# Patient Record
Sex: Female | Born: 1945 | Race: Black or African American | Hispanic: No | Smoking: Never smoker
Health system: Southern US, Community
[De-identification: ages and names within clinical notes are randomized; demographics above are authoritative.]

## PROBLEM LIST (undated history)

## (undated) DIAGNOSIS — Z8719 Personal history of other diseases of the digestive system: Secondary | ICD-10-CM

## (undated) DIAGNOSIS — R112 Nausea with vomiting, unspecified: Secondary | ICD-10-CM

## (undated) DIAGNOSIS — T8859XA Other complications of anesthesia, initial encounter: Secondary | ICD-10-CM

## (undated) DIAGNOSIS — M7050 Other bursitis of knee, unspecified knee: Secondary | ICD-10-CM

## (undated) DIAGNOSIS — K5732 Diverticulitis of large intestine without perforation or abscess without bleeding: Secondary | ICD-10-CM

## (undated) DIAGNOSIS — K219 Gastro-esophageal reflux disease without esophagitis: Secondary | ICD-10-CM

## (undated) DIAGNOSIS — Z9889 Other specified postprocedural states: Secondary | ICD-10-CM

## (undated) DIAGNOSIS — M1990 Unspecified osteoarthritis, unspecified site: Secondary | ICD-10-CM

## (undated) DIAGNOSIS — I10 Essential (primary) hypertension: Secondary | ICD-10-CM

## (undated) DIAGNOSIS — G4730 Sleep apnea, unspecified: Secondary | ICD-10-CM

## (undated) DIAGNOSIS — T4145XA Adverse effect of unspecified anesthetic, initial encounter: Secondary | ICD-10-CM

## (undated) DIAGNOSIS — I219 Acute myocardial infarction, unspecified: Secondary | ICD-10-CM

## (undated) DIAGNOSIS — E042 Nontoxic multinodular goiter: Secondary | ICD-10-CM

## (undated) DIAGNOSIS — J189 Pneumonia, unspecified organism: Secondary | ICD-10-CM

## (undated) DIAGNOSIS — C801 Malignant (primary) neoplasm, unspecified: Secondary | ICD-10-CM

## (undated) HISTORY — PX: FOOT SURGERY: SHX648

## (undated) HISTORY — DX: Other bursitis of knee, unspecified knee: M70.50

## (undated) HISTORY — PX: BREAST BIOPSY: SHX20

## (undated) HISTORY — PX: ABDOMINAL HYSTERECTOMY: SHX81

## (undated) HISTORY — DX: Unspecified osteoarthritis, unspecified site: M19.90

## (undated) HISTORY — DX: Sleep apnea, unspecified: G47.30

## (undated) HISTORY — PX: CERVICAL DISC SURGERY: SHX588

## (undated) HISTORY — PX: CARDIAC CATHETERIZATION: SHX172

## (undated) HISTORY — DX: Diverticulitis of large intestine without perforation or abscess without bleeding: K57.32

## (undated) HISTORY — PX: KNEE ARTHROSCOPY: SUR90

---

## 1998-01-31 ENCOUNTER — Encounter: Admission: RE | Admit: 1998-01-31 | Discharge: 1998-05-01 | Payer: Self-pay | Admitting: Orthopedic Surgery

## 2000-03-11 ENCOUNTER — Encounter: Admission: RE | Admit: 2000-03-11 | Discharge: 2000-04-23 | Payer: Self-pay | Admitting: Orthopedic Surgery

## 2003-08-31 ENCOUNTER — Encounter: Admission: RE | Admit: 2003-08-31 | Discharge: 2003-11-11 | Payer: Self-pay | Admitting: Orthopedic Surgery

## 2004-03-13 ENCOUNTER — Encounter: Admission: RE | Admit: 2004-03-13 | Discharge: 2004-05-24 | Payer: Self-pay | Admitting: Orthopedic Surgery

## 2007-10-13 ENCOUNTER — Encounter: Admission: RE | Admit: 2007-10-13 | Discharge: 2007-12-05 | Payer: Self-pay | Admitting: Orthopedic Surgery

## 2009-11-12 ENCOUNTER — Emergency Department (HOSPITAL_BASED_OUTPATIENT_CLINIC_OR_DEPARTMENT_OTHER)
Admission: EM | Admit: 2009-11-12 | Discharge: 2009-11-12 | Payer: Self-pay | Source: Home / Self Care | Admitting: Emergency Medicine

## 2009-11-12 ENCOUNTER — Ambulatory Visit: Payer: Self-pay | Admitting: Diagnostic Radiology

## 2011-05-27 ENCOUNTER — Emergency Department (INDEPENDENT_AMBULATORY_CARE_PROVIDER_SITE_OTHER): Payer: Medicare Other

## 2011-05-27 ENCOUNTER — Emergency Department (HOSPITAL_BASED_OUTPATIENT_CLINIC_OR_DEPARTMENT_OTHER)
Admission: EM | Admit: 2011-05-27 | Discharge: 2011-05-27 | Disposition: A | Discharge status: Home / Self Care | Payer: Medicare Other | Attending: Emergency Medicine | Admitting: Emergency Medicine

## 2011-05-27 ENCOUNTER — Encounter: Payer: Self-pay | Admitting: Emergency Medicine

## 2011-05-27 DIAGNOSIS — I10 Essential (primary) hypertension: Secondary | ICD-10-CM | POA: Insufficient documentation

## 2011-05-27 DIAGNOSIS — J45909 Unspecified asthma, uncomplicated: Secondary | ICD-10-CM | POA: Insufficient documentation

## 2011-05-27 DIAGNOSIS — K219 Gastro-esophageal reflux disease without esophagitis: Secondary | ICD-10-CM | POA: Insufficient documentation

## 2011-05-27 DIAGNOSIS — R079 Chest pain, unspecified: Secondary | ICD-10-CM

## 2011-05-27 DIAGNOSIS — Z79899 Other long term (current) drug therapy: Secondary | ICD-10-CM | POA: Insufficient documentation

## 2011-05-27 HISTORY — DX: Gastro-esophageal reflux disease without esophagitis: K21.9

## 2011-05-27 HISTORY — DX: Essential (primary) hypertension: I10

## 2011-05-27 LAB — BASIC METABOLIC PANEL
Calcium: 9.8 mg/dL (ref 8.4–10.5)
Creatinine, Ser: 0.6 mg/dL (ref 0.50–1.10)
Sodium: 137 mEq/L (ref 135–145)

## 2011-05-27 LAB — CARDIAC PANEL(CRET KIN+CKTOT+MB+TROPI)
CK, MB: 2.5 ng/mL (ref 0.3–4.0)
Relative Index: 2 (ref 0.0–2.5)
Troponin I: <0.3 ng/mL (ref <0.30)

## 2011-05-27 LAB — DIFFERENTIAL
Basophils Absolute: 0 10*3/uL (ref 0.0–0.1)
Basophils Relative: 0 % (ref 0–1)
Eosinophils Relative: 1 % (ref 0–5)
Lymphocytes Relative: 23 % (ref 12–46)
Lymphs Abs: 1.7 10*3/uL (ref 0.7–4.0)
Monocytes Absolute: 0.5 10*3/uL (ref 0.1–1.0)
Monocytes Relative: 7 % (ref 3–12)
Neutrophils Relative %: 69 % (ref 43–77)

## 2011-05-27 LAB — CBC
Hemoglobin: 13.4 g/dL (ref 12.0–15.0)
MCV: 86.6 fL (ref 78.0–100.0)
Platelets: 255 10*3/uL (ref 150–400)
WBC: 7.7 10*3/uL (ref 4.0–10.5)

## 2011-05-27 NOTE — ED Provider Notes (Signed)
History     CSN: [id number] Arrival date & time: 05/27/2011 10:36 AM   None     Chief Complaint  Patient presents with  . Chest Pain    (Consider location/radiation/quality/duration/timing/severity/associated sxs/prior treatment) HPI Comments: Patient is a 65 year old woman who woke up around 4 in the morning with her heart beating funny. She says this has happened several times when she wakes up in the morning. She doesn't feel any chest pain or palpitations now. When this occurred, she felt a pain in her shoulders, and had some belching. She has had no prior evaluation for this particular symptom. However, she's had stress test in the past, and had a cardiac catheterization in 2011 which was negative. She has a history of acid reflux, for which she takes Protonix.  Patient is a 65 y.o. female presenting with chest pain.  Chest Pain The chest pain began 6 - 12 hours ago. Chest pain occurs intermittently. The chest pain is resolved. Associated with: She noted pain in the shoulders and excessive belching. Pain scale at highest: She describes the feeling as feeling her heart beating, not so much a pain. Exacerbated by: Nothing seems to make this sensation better or worse. Pertinent negatives for primary symptoms include no fever. Associated symptoms comments: She notes pain in her shoulders, and excessive burping.. She tried nothing for the symptoms. Risk factors include being elderly and post-menopausal.     Past Medical History  Diagnosis Date  . Hypertension   . Asthma   . GERD (gastroesophageal reflux disease)     Past Surgical History  Procedure Date  . Abdominal hysterectomy   . Cervical disc surgery   . Breast biopsy     several- all benign  . Cardiac catheterization     No family history on file.  History  Substance Use Topics  . Smoking status: Never Smoker   . Smokeless tobacco: Not on file  . Alcohol Use: No    OB History    Grav Para Term Preterm Abortions  TAB SAB Ect Mult Living                  Review of Systems  Constitutional: Negative.  Negative for fever.  HENT: Negative.   Eyes: Negative.   Respiratory: Negative.   Cardiovascular: Positive for chest pain.  Gastrointestinal:       Excessive burping.  Genitourinary: Negative.   Musculoskeletal:       Shoulder pain.  Skin: Negative.   Neurological: Negative.   Psychiatric/Behavioral: Negative.     Allergies  Morphine and related; Avelox; Biaxin; Celebrex; Codeine; Food; Hycodan; Pentazocine; Septra; Stadol; Zoloft; Duraphen; Floxin; and Penicillins  Home Medications   Current Outpatient Rx  Name Route Sig Dispense Refill  . ACETAMINOPHEN 500 MG PO TABS Oral Take 1,000 mg by mouth every 6 (six) hours as needed. For arthritis     . ALBUTEROL SULFATE HFA 108 (90 BASE) MCG/ACT IN AERS Inhalation Inhale 2 puffs into the lungs every 4 (four) hours as needed. Wheezing and shortness of breath     . AMILORIDE-HYDROCHLOROTHIAZIDE 5-50 MG PO TABS Oral Take 1 tablet by mouth daily.      . ASPIRIN 81 MG PO TABS Oral Take 81 mg by mouth daily.      Marland Kitchen DILTIAZEM HCL COATED BEADS 300 MG PO CP24 Oral Take 300 mg by mouth daily.      Marland Kitchen ESTRADIOL 2 MG PO TABS Oral Take 2 mg by mouth daily.      Marland Kitchen  MELOXICAM 15 MG PO TABS Oral Take 15 mg by mouth daily as needed. For arthritis     . MONTELUKAST SODIUM 10 MG PO TABS Oral Take 10 mg by mouth at bedtime.      Marland Kitchen PANTOPRAZOLE SODIUM 40 MG PO TBEC Oral Take 40 mg by mouth daily.        BP 131/77  Pulse 70  Temp(Src) 97.8 F (36.6 C) (Oral)  Resp 16  SpO2 99%  Physical Exam  Nursing note and vitals reviewed. Constitutional: She is oriented to person, place, and time. She appears well-developed and well-nourished. No distress.  HENT:  Head: Normocephalic and atraumatic.  Right Ear: External ear normal.  Left Ear: External ear normal.  Mouth/Throat: Oropharynx is clear and moist.  Eyes: Conjunctivae and EOM are normal. Pupils are equal,  round, and reactive to light.  Neck: Normal range of motion. Neck supple. No tracheal deviation present. Thyromegaly present.  Cardiovascular: Normal rate, regular rhythm and normal heart sounds.   Pulmonary/Chest: Effort normal and breath sounds normal.  Abdominal: Soft. Bowel sounds are normal.  Musculoskeletal: Normal range of motion.       No deformity of her shoulders. No calf tenderness, no Homans sign.  Lymphadenopathy:    She has no cervical adenopathy.  Neurological: She is alert and oriented to person, place, and time. She has normal reflexes.       No sensory or motor deficits.  Skin: Skin is warm and dry.  Psychiatric: She has a normal mood and affect. Her behavior is normal.    ED Course  Procedures (including critical care time)   Labs Reviewed  CARDIAC PANEL(CRET KIN+CKTOT+MB+TROPI)  CBC  DIFFERENTIAL  BASIC METABOLIC PANEL   Dg Chest 2 View  05/27/2011  *RADIOLOGY REPORT*  Clinical Data: Chest pain.  CHEST - 2 VIEW  Comparison: 11/12/2009  Findings: Heart and mediastinal contours are within normal limits. No focal opacities or effusions.  No acute bony abnormality. Degenerative changes in the thoracic spine.  IMPRESSION: No active cardiopulmonary disease.  Original Report Authenticated By: Cyndie Chime, M.D.    Date: 05/27/2011  Rate: 77 Rhythm: normal sinus rhythm  QRS Axis: left  Intervals: normal  ST/T Wave abnormalities: normal  Conduction Disutrbances: Incomplete RBBB  Narrative Interpretation: Incomplete right bundle branch block.  Old EKG Reviewed: none available  1:00 PM Patient was seen and had physical examination. EKG was benign. Laboratory tests and chest x-ray were ordered.  1:00 PM Chest x-ray and cardiac markers were negative. I discussed these results with the patient. Her her test results are negative it is quite safe to go home. She has an appointment tomorrow for preop testing for a total knee operation. I advised her to take her lab  tests with her and she may be able to avoid having some of the tests.   Results for orders placed during the hospital encounter of 05/27/11  CARDIAC PANEL(CRET KIN+CKTOT+MB+TROPI)      Component Value Range   Total CK 122  7 - 177 (U/L)   CK, MB 2.5  0.3 - 4.0 (ng/mL)   Troponin I <0.30  <0.30 (ng/mL)   Relative Index 2.0  0.0 - 2.5   CBC      Component Value Range   WBC 7.7  4.0 - 10.5 (K/uL)   RBC 4.48  3.87 - 5.11 (MIL/uL)   Hemoglobin 13.4  12.0 - 15.0 (g/dL)   HCT 24.4  01.0 - 27.2 (%)   MCV 86.6  78.0 - 100.0 (fL)   MCH 29.9  26.0 - 34.0 (pg)   MCHC 34.5  30.0 - 36.0 (g/dL)   RDW 16.1  09.6 - 04.5 (%)   Platelets 255  150 - 400 (K/uL)  DIFFERENTIAL      Component Value Range   Neutrophils Relative 69  43 - 77 (%)   Neutro Abs 5.4  1.7 - 7.7 (K/uL)   Lymphocytes Relative 23  12 - 46 (%)   Lymphs Abs 1.7  0.7 - 4.0 (K/uL)   Monocytes Relative 7  3 - 12 (%)   Monocytes Absolute 0.5  0.1 - 1.0 (K/uL)   Eosinophils Relative 1  0 - 5 (%)   Eosinophils Absolute 0.1  0.0 - 0.7 (K/uL)   Basophils Relative 0  0 - 1 (%)   Basophils Absolute 0.0  0.0 - 0.1 (K/uL)  BASIC METABOLIC PANEL      Component Value Range   Sodium 137  135 - 145 (mEq/L)   Potassium 3.5  3.5 - 5.1 (mEq/L)   Chloride 100  96 - 112 (mEq/L)   CO2 27  19 - 32 (mEq/L)   Glucose, Bld 92  70 - 99 (mg/dL)   BUN 12  6 - 23 (mg/dL)   Creatinine, Ser 4.09  0.50 - 1.10 (mg/dL)   Calcium 9.8  8.4 - 81.1 (mg/dL)   GFR calc non Af Amer >90  >90 (mL/min)   GFR calc Af Amer >90  >90 (mL/min)   Dg Chest 2 View  05/27/2011  *RADIOLOGY REPORT*  Clinical Data: Chest pain.  CHEST - 2 VIEW  Comparison: 11/12/2009  Findings: Heart and mediastinal contours are within normal limits. No focal opacities or effusions.  No acute bony abnormality. Degenerative changes in the thoracic spine.  IMPRESSION: No active cardiopulmonary disease.  Original Report Authenticated By: Cyndie Chime, M.D.     1. Chest pain, unspecified             Carleene Cooper III, MD 05/27/11 1300

## 2011-05-27 NOTE — ED Notes (Signed)
Pt reports "felt heart beating" more than usual at 4 am when she got up to use bathroom- does not describe feeling as pain, but sts she felt "weird"- had some belching as well

## 2011-05-28 ENCOUNTER — Encounter (HOSPITAL_COMMUNITY): Payer: Medicare Other

## 2011-05-28 ENCOUNTER — Encounter (HOSPITAL_COMMUNITY): Payer: Self-pay

## 2011-05-28 DIAGNOSIS — K5732 Diverticulitis of large intestine without perforation or abscess without bleeding: Secondary | ICD-10-CM

## 2011-05-28 HISTORY — DX: Diverticulitis of large intestine without perforation or abscess without bleeding: K57.32

## 2011-05-28 LAB — URINALYSIS, ROUTINE W REFLEX MICROSCOPIC
Bilirubin Urine: NEGATIVE
Hgb urine dipstick: NEGATIVE
Protein, ur: NEGATIVE mg/dL
Urobilinogen, UA: 0.2 mg/dL (ref 0.0–1.0)

## 2011-05-28 LAB — COMPREHENSIVE METABOLIC PANEL
AST: 19 U/L (ref 0–37)
Albumin: 4.2 g/dL (ref 3.5–5.2)
Alkaline Phosphatase: 66 U/L (ref 39–117)
Chloride: 97 mEq/L (ref 96–112)
GFR calc Af Amer: 73 mL/min — ABNORMAL LOW (ref >90)
GFR calc non Af Amer: 63 mL/min — ABNORMAL LOW (ref >90)
Glucose, Bld: 87 mg/dL (ref 70–99)
Total Protein: 7.8 g/dL (ref 6.0–8.3)

## 2011-05-28 LAB — PROTIME-INR: Prothrombin Time: 13.8 seconds (ref 11.6–15.2)

## 2011-05-28 LAB — SURGICAL PCR SCREEN: MRSA, PCR: NEGATIVE

## 2011-05-28 MED ORDER — CHLORHEXIDINE GLUCONATE 4 % EX LIQD: 60.0000 mL | Freq: Once | CUTANEOUS | Status: DC

## 2011-05-28 NOTE — Pre-Procedure Instructions (Signed)
PT. Has allergy to Floxin and Penicillin(rash to both)-fax to Dr. Lequita Halt to advise and order Preop antibiotic.

## 2011-05-28 NOTE — Pre-Procedure Instructions (Signed)
05-28-11 PCR screen done. Ekg 05-07-11 with chart. CBC, BMP , CXR results with chart done 05-27-11.

## 2011-05-28 NOTE — Pre-Procedure Instructions (Addendum)
Pt. Has allergy to Floxin and Penicillin allergy-fax to Dr. Lequita Halt office to advise of preop antibiotic. 05-30-11 faxed received to give Ancef per protocol per Dr. Lequita Halt.

## 2011-05-28 NOTE — Patient Instructions (Signed)
20 Legna Mausolf  05/28/2011   Your procedure is scheduled on: 06-04-11 Report to [facility] at 0515 AM.  Call this number if you have problems the morning of surgery: [phone]   Remember:   Do not eat food:After Midnight.  Do not drink clear liquids: After Midnight.  Take these medicines the morning of surgery with A SIP OF WATER: Cartia, Pantoprazole, ProAir Inhaler-use and bring   Do not wear jewelry, make-up or nail polish.  Do not wear lotions, powders, or perfumes. You may wear deodorant.  Do not shave 48 hours prior to surgery.  Do not bring valuables to the hospital.  Contacts, dentures or bridgework may not be worn into surgery.  Leave suitcase in the car. After surgery it may be brought to your room.  For patients admitted to the hospital, checkout time is 11:00 AM the day of discharge.   Patients discharged the day of surgery will not be allowed to drive home.  Name and phone number of your driver: spouse  Special Instructions: Incentive Spirometry - Practice and bring it with you on the day of surgery. and CHG Shower Use Special Wash: 1/2 bottle night before surgery and 1/2 bottle morning of surgery.   Please read over the following fact sheets that you were given: Blood Transfusion Information and MRSA Information

## 2011-05-30 ENCOUNTER — Other Ambulatory Visit: Payer: Self-pay | Admitting: Orthopedic Surgery

## 2011-05-30 NOTE — H&P (Signed)
  Dawn Collins DOB: 10/19/1945  Date of Admission:  06/04/2011  Chief Complaint:  Right Knee Pain  History of Present Illness: The patient is a 65 year old female who comes in today for a preoperative History and Physical. The patient is scheduled for a right total knee arthroplasty to be performed by Dr. Gus Rankin. Aluisio, MD at [facility] on 06/04/2011.  Allergies: Aspirin - GI upset Avelox - Rash Biaxin - GI upset Celebrex - Chest Pain [place] Derivatives - Sweating, vomiting, shortness of breath Duraphen - Palpitations Floxin - Rash Hycodan Syrup - Dyspnea Penicillins - Rash Pentazocine-Naloxone HCL - Chest pain, fever, jaw pain Septra - Vomiting Stadol - Nausea, vomiting Talwin NX - Numbness Zoloft - Tinnitus  Medications: Amiloride-HCTZ 5-50 Aspirin 81mg   Cartia XT 300 mg Doxycycline Hyclate 100 mg Estradiol 2 mg Meloxicam 15 mg Metronidazole 0.75% gel Pantoprazole Sodium 40 mg ProAir HFA Singulair 10 mg  Problem List/Past Medical History: Anxiety Disorder Hypertension: Asthma Asthmatic Bronchitis Fibrocystic Breast Disease Carpal Tunnel Syndrome, Bilateral Cervical Spine Stenosis Diverticulitis Of Colon Diverticulosis Gastroesophageal Reflux Disease Fatty Liver Headaches [place]ia Irritable bowel syndrome Lumbar Radiculopathy Nontoxic Multinodular Goiter Sleep Apnea. Mild Obstructive Spinal Stenosis History of Bursitis History of Sinusitis Urinary Incontinence. Stress  Family History: Father. Deceased, Emphysema, Prostate Cancer. Age 31 Mother. Deceased, Breast Cancer, Brain Cancer, Kidney disease. Age 39  Social History: Tobacco use. Never smoker. Alcohol use. Never consumed alcohol. Marital status. Married. Children. 1 son Living situation. Lives with spouse. 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 Plan is for Home  Past Surgical History: Tubal Ligation Hysterectomy  (not due to cancer) - Partial Breast Surgery, Bilateral Breast Surgery Nipple Exploartion, Bilateral Foot Surgery, Bilateral Arthroscopic Knee Surgery - Right Neck Disc Surgery. Date: 41. Cervical Lam Cardiac Cath. Date: 09/2010. Dr. Lamar Sprinkles  ROS: General: No chills, fevers, night sweats, fatigue. Neuro:  No seizures, syncope, paralysis, visual problems. Respiratory:  Positive for shortness of breath at rest; no productive cough, hemoptysis. Cardiovascular: No chest pain, angina, palpitations, orthopnea. GI: No nausea, vomiting, diarrhea, constipation. Positive for heartburn. GU:  No dysuria, hematuria, discharge. Musculoskeletal:  Joint pain, back pain, and morning stiffness.  Vitals: Weight: 182 lb Height: 65.75 in Weight was reported by patient. Height was reported by patient. Body Surface Area: 1.96 m Body Mass Index: 29.6 kg/m Pulse: 76 (Regular) Resp.: 12 (Unlabored) BP: 142/78 (Sitting, Left Arm, Standard)  Physical Exam: GENERAL: Patient is a 65 y.o. female, well-nourished, well-developed, no acute distress. Alert, oriented, cooperative. Good historian. HENT:  Normocephalic, atraumatic. Pupils round and reactive. EOMs intact. NECK:  Supple, no bruits. CHEST:  Clear to anterior and posterior chest walls. No rhonchi, rales, wheezes. HEART:  Regular, rate and rhythm.  No murmurs.  S1 and S2 noted. ABDOMEN:  Soft, nontender, bowel sounds present, round abdomen. RECTAL/BREAST/GENITALIA:  Not done, not pertinent to present illness. EXTREMITIES:  Valgus deformity, motor intact, 0-120 degrees, marked crepitus.   Assessment & Plan: Osteoarthrosis NOS, lower leg (715.96) Impression: Right Knee  Patient will be admitted for a right total knee replacement. Risks and benefits of the surgery have been discussed with the patient and they elect to proceed with surgery.  There are on active contraindications to upcoming procedure such as ongoing infection or  progressive neurological disease.  Plan is to go home with her husband and sister.  Avel Peace, PA-C

## 2011-06-03 ENCOUNTER — Encounter (HOSPITAL_COMMUNITY): Payer: Self-pay | Admitting: Orthopedic Surgery

## 2011-06-03 MED ORDER — BUPIVACAINE 0.25 % ON-Q PUMP SINGLE CATH 300ML
300.0000 mL | INJECTION | Status: DC
  Administered 2011-06-04: 300 mL
  Filled 2011-06-03: qty 300

## 2011-06-04 ENCOUNTER — Encounter (HOSPITAL_COMMUNITY): Payer: Self-pay | Admitting: Anesthesiology

## 2011-06-04 ENCOUNTER — Inpatient Hospital Stay (HOSPITAL_COMMUNITY): Payer: Medicare Other | Admitting: Anesthesiology

## 2011-06-04 ENCOUNTER — Inpatient Hospital Stay (HOSPITAL_COMMUNITY)
Admission: RE | Admit: 2011-06-04 | Discharge: 2011-06-07 | DRG: 470 | Disposition: A | Discharge status: Home Health | Payer: Medicare Other | Source: Ambulatory Visit | Attending: Orthopedic Surgery | Admitting: Orthopedic Surgery

## 2011-06-04 ENCOUNTER — Encounter (HOSPITAL_COMMUNITY): Payer: Self-pay | Admitting: *Deleted

## 2011-06-04 ENCOUNTER — Encounter (HOSPITAL_COMMUNITY)
Admission: RE | Disposition: A | Discharge status: Home Health | Payer: Self-pay | Source: Ambulatory Visit | Attending: Orthopedic Surgery

## 2011-06-04 DIAGNOSIS — Z888 Allergy status to other drugs, medicaments and biological substances status: Secondary | ICD-10-CM

## 2011-06-04 DIAGNOSIS — Z79899 Other long term (current) drug therapy: Secondary | ICD-10-CM

## 2011-06-04 DIAGNOSIS — G4733 Obstructive sleep apnea (adult) (pediatric): Secondary | ICD-10-CM | POA: Diagnosis present

## 2011-06-04 DIAGNOSIS — K219 Gastro-esophageal reflux disease without esophagitis: Secondary | ICD-10-CM | POA: Diagnosis present

## 2011-06-04 DIAGNOSIS — E871 Hypo-osmolality and hyponatremia: Secondary | ICD-10-CM | POA: Diagnosis not present

## 2011-06-04 DIAGNOSIS — N393 Stress incontinence (female) (male): Secondary | ICD-10-CM | POA: Diagnosis present

## 2011-06-04 DIAGNOSIS — M4800 Spinal stenosis, site unspecified: Secondary | ICD-10-CM | POA: Diagnosis present

## 2011-06-04 DIAGNOSIS — M1710 Unilateral primary osteoarthritis, unspecified knee: Principal | ICD-10-CM | POA: Diagnosis present

## 2011-06-04 DIAGNOSIS — K589 Irritable bowel syndrome without diarrhea: Secondary | ICD-10-CM | POA: Diagnosis present

## 2011-06-04 DIAGNOSIS — M76899 Other specified enthesopathies of unspecified lower limb, excluding foot: Secondary | ICD-10-CM | POA: Diagnosis present

## 2011-06-04 DIAGNOSIS — M1711 Unilateral primary osteoarthritis, right knee: Secondary | ICD-10-CM

## 2011-06-04 DIAGNOSIS — Z01812 Encounter for preprocedural laboratory examination: Secondary | ICD-10-CM

## 2011-06-04 DIAGNOSIS — M21069 Valgus deformity, not elsewhere classified, unspecified knee: Secondary | ICD-10-CM | POA: Diagnosis present

## 2011-06-04 DIAGNOSIS — E876 Hypokalemia: Secondary | ICD-10-CM | POA: Diagnosis not present

## 2011-06-04 DIAGNOSIS — I10 Essential (primary) hypertension: Secondary | ICD-10-CM | POA: Diagnosis present

## 2011-06-04 DIAGNOSIS — IMO0002 Reserved for concepts with insufficient information to code with codable children: Principal | ICD-10-CM | POA: Diagnosis present

## 2011-06-04 DIAGNOSIS — K5730 Diverticulosis of large intestine without perforation or abscess without bleeding: Secondary | ICD-10-CM | POA: Diagnosis present

## 2011-06-04 DIAGNOSIS — Z880 Allergy status to penicillin: Secondary | ICD-10-CM

## 2011-06-04 DIAGNOSIS — F411 Generalized anxiety disorder: Secondary | ICD-10-CM | POA: Diagnosis present

## 2011-06-04 DIAGNOSIS — R110 Nausea: Secondary | ICD-10-CM | POA: Diagnosis not present

## 2011-06-04 DIAGNOSIS — J45909 Unspecified asthma, uncomplicated: Secondary | ICD-10-CM | POA: Diagnosis present

## 2011-06-04 DIAGNOSIS — G4700 Insomnia, unspecified: Secondary | ICD-10-CM | POA: Diagnosis present

## 2011-06-04 DIAGNOSIS — Z7982 Long term (current) use of aspirin: Secondary | ICD-10-CM

## 2011-06-04 HISTORY — PX: TOTAL KNEE ARTHROPLASTY: SHX125

## 2011-06-04 LAB — TYPE AND SCREEN: ABO/RH(D): AB POS

## 2011-06-04 LAB — ABO/RH: ABO/RH(D): AB POS

## 2011-06-04 SURGERY — ARTHROPLASTY, KNEE, TOTAL
Anesthesia: Choice | Site: Knee | Laterality: Right | Wound class: Clean

## 2011-06-04 MED ORDER — ONDANSETRON HCL 4 MG/2ML IJ SOLN
4.0000 mg | Freq: Four times a day (QID) | INTRAMUSCULAR | Status: DC | PRN
  Administered 2011-06-06: 4 mg via INTRAVENOUS
  Filled 2011-06-04 (×3): qty 2

## 2011-06-04 MED ORDER — ACETAMINOPHEN 325 MG PO TABS
650.0000 mg | ORAL_TABLET | Freq: Four times a day (QID) | ORAL | Status: DC | PRN
  Administered 2011-06-07 (×2): 650 mg via ORAL
  Filled 2011-06-04 (×2): qty 2

## 2011-06-04 MED ORDER — ACETAMINOPHEN 10 MG/ML IV SOLN
1000.0000 mg | Freq: Four times a day (QID) | INTRAVENOUS | Status: AC
  Administered 2011-06-04 – 2011-06-05 (×4): 1000 mg via INTRAVENOUS
  Filled 2011-06-04 (×3): qty 100

## 2011-06-04 MED ORDER — FENTANYL CITRATE 0.05 MG/ML IJ SOLN
INTRAMUSCULAR | Status: DC | PRN
  Administered 2011-06-04: 100 ug via INTRAVENOUS

## 2011-06-04 MED ORDER — LACTATED RINGERS IV SOLN
INTRAVENOUS | Status: DC | PRN
  Administered 2011-06-04 (×2): via INTRAVENOUS

## 2011-06-04 MED ORDER — MENTHOL 3 MG MT LOZG
1.0000 | LOZENGE | OROMUCOSAL | Status: DC | PRN
  Filled 2011-06-04: qty 9

## 2011-06-04 MED ORDER — TRIAMTERENE-HCTZ 75-50 MG PO TABS
1.0000 | ORAL_TABLET | Freq: Every day | ORAL | Status: DC
  Administered 2011-06-04 – 2011-06-07 (×4): 1 via ORAL
  Filled 2011-06-04 (×4): qty 1

## 2011-06-04 MED ORDER — ONDANSETRON HCL 4 MG PO TABS: 4.0000 mg | ORAL_TABLET | Freq: Four times a day (QID) | ORAL | Status: DC | PRN

## 2011-06-04 MED ORDER — SODIUM CHLORIDE 0.9 % IR SOLN
Status: DC | PRN
  Administered 2011-06-04: 1000 mL

## 2011-06-04 MED ORDER — ONDANSETRON HCL 4 MG/2ML IJ SOLN
4.0000 mg | Freq: Four times a day (QID) | INTRAMUSCULAR | Status: DC | PRN
  Administered 2011-06-04 (×2): 4 mg via INTRAVENOUS

## 2011-06-04 MED ORDER — DEXAMETHASONE SODIUM PHOSPHATE 10 MG/ML IJ SOLN
INTRAMUSCULAR | Status: DC | PRN
  Administered 2011-06-04: 10 mg via INTRAVENOUS

## 2011-06-04 MED ORDER — DOCUSATE SODIUM 100 MG PO CAPS
100.0000 mg | ORAL_CAPSULE | Freq: Two times a day (BID) | ORAL | Status: DC
  Administered 2011-06-05 – 2011-06-06 (×4): 100 mg via ORAL
  Filled 2011-06-04 (×7): qty 1

## 2011-06-04 MED ORDER — ALBUTEROL SULFATE HFA 108 (90 BASE) MCG/ACT IN AERS
2.0000 | INHALATION_SPRAY | RESPIRATORY_TRACT | Status: DC | PRN
  Filled 2011-06-04: qty 6.7

## 2011-06-04 MED ORDER — BUPIVACAINE HCL 0.75 % IJ SOLN
INTRAMUSCULAR | Status: DC | PRN
  Administered 2011-06-04: 14 mg

## 2011-06-04 MED ORDER — CEFAZOLIN SODIUM 1-5 GM-% IV SOLN
1.0000 g | Freq: Four times a day (QID) | INTRAVENOUS | Status: AC
  Administered 2011-06-04 – 2011-06-05 (×3): 1 g via INTRAVENOUS
  Filled 2011-06-04 (×4): qty 50

## 2011-06-04 MED ORDER — BISACODYL 10 MG RE SUPP
10.0000 mg | Freq: Every day | RECTAL | Status: DC | PRN
  Administered 2011-06-06: 10 mg via RECTAL
  Filled 2011-06-04 (×2): qty 1

## 2011-06-04 MED ORDER — POLYETHYLENE GLYCOL 3350 17 G PO PACK
17.0000 g | PACK | Freq: Every day | ORAL | Status: DC | PRN
  Filled 2011-06-04: qty 1

## 2011-06-04 MED ORDER — DIPHENHYDRAMINE HCL 50 MG/ML IJ SOLN: 12.5000 mg | Freq: Four times a day (QID) | INTRAMUSCULAR | Status: DC | PRN

## 2011-06-04 MED ORDER — DILTIAZEM HCL ER COATED BEADS 300 MG PO CP24
300.0000 mg | ORAL_CAPSULE | Freq: Every day | ORAL | Status: DC
  Administered 2011-06-05 – 2011-06-07 (×3): 300 mg via ORAL
  Filled 2011-06-04 (×4): qty 1

## 2011-06-04 MED ORDER — DIPHENHYDRAMINE HCL 12.5 MG/5ML PO ELIX: 12.5000 mg | ORAL_SOLUTION | ORAL | Status: DC | PRN

## 2011-06-04 MED ORDER — ACETAMINOPHEN 650 MG RE SUPP: 650.0000 mg | Freq: Four times a day (QID) | RECTAL | Status: DC | PRN

## 2011-06-04 MED ORDER — FLEET ENEMA 7-19 GM/118ML RE ENEM: 1.0000 | ENEMA | Freq: Every day | RECTAL | Status: DC | PRN

## 2011-06-04 MED ORDER — HYDROMORPHONE 0.3 MG/ML IV SOLN
INTRAVENOUS | Status: DC
  Administered 2011-06-04: 0.999 mg via INTRAVENOUS
  Administered 2011-06-04: 0.599 mg via INTRAVENOUS
  Administered 2011-06-04 – 2011-06-05 (×4): 0.2 mg via INTRAVENOUS
  Filled 2011-06-04: qty 25

## 2011-06-04 MED ORDER — NALOXONE HCL 0.4 MG/ML IJ SOLN: 0.4000 mg | INTRAMUSCULAR | Status: DC | PRN

## 2011-06-04 MED ORDER — MAGNESIUM HYDROXIDE 400 MG/5ML PO SUSP: 30.0000 mL | Freq: Two times a day (BID) | ORAL | Status: DC | PRN

## 2011-06-04 MED ORDER — METOCLOPRAMIDE HCL 10 MG PO TABS: 5.0000 mg | ORAL_TABLET | Freq: Three times a day (TID) | ORAL | Status: DC | PRN

## 2011-06-04 MED ORDER — SODIUM CHLORIDE 0.9 % IJ SOLN: 9.0000 mL | INTRAMUSCULAR | Status: DC | PRN

## 2011-06-04 MED ORDER — PROPOFOL 10 MG/ML IV EMUL
INTRAVENOUS | Status: DC | PRN
  Administered 2011-06-04: 75 ug/kg/min via INTRAVENOUS

## 2011-06-04 MED ORDER — TEMAZEPAM 15 MG PO CAPS: 15.0000 mg | ORAL_CAPSULE | Freq: Every evening | ORAL | Status: DC | PRN

## 2011-06-04 MED ORDER — BISACODYL 5 MG PO TBEC: 10.0000 mg | DELAYED_RELEASE_TABLET | Freq: Every day | ORAL | Status: DC | PRN

## 2011-06-04 MED ORDER — KCL IN DEXTROSE-NACL 20-5-0.9 MEQ/L-%-% IV SOLN
INTRAVENOUS | Status: DC
  Administered 2011-06-04: 14:00:00 via INTRAVENOUS
  Filled 2011-06-04 (×3): qty 1000

## 2011-06-04 MED ORDER — METHOCARBAMOL 100 MG/ML IJ SOLN
500.0000 mg | Freq: Four times a day (QID) | INTRAVENOUS | Status: DC | PRN
  Filled 2011-06-04: qty 5

## 2011-06-04 MED ORDER — KETAMINE HCL 10 MG/ML IJ SOLN
INTRAMUSCULAR | Status: DC | PRN
  Administered 2011-06-04: 20 mg via INTRAVENOUS

## 2011-06-04 MED ORDER — ZOLPIDEM TARTRATE 5 MG PO TABS: 5.0000 mg | ORAL_TABLET | Freq: Every evening | ORAL | Status: DC | PRN

## 2011-06-04 MED ORDER — CEFAZOLIN SODIUM-DEXTROSE 2-3 GM-% IV SOLR
2.0000 g | Freq: Once | INTRAVENOUS | Status: AC
  Administered 2011-06-04: 2 g via INTRAVENOUS
  Filled 2011-06-04: qty 50

## 2011-06-04 MED ORDER — TRAMADOL HCL 50 MG PO TABS: 50.0000 mg | ORAL_TABLET | Freq: Four times a day (QID) | ORAL | Status: DC | PRN

## 2011-06-04 MED ORDER — METOCLOPRAMIDE HCL 5 MG/ML IJ SOLN: 5.0000 mg | Freq: Three times a day (TID) | INTRAMUSCULAR | Status: DC | PRN

## 2011-06-04 MED ORDER — MONTELUKAST SODIUM 10 MG PO TABS
10.0000 mg | ORAL_TABLET | Freq: Every day | ORAL | Status: DC
  Administered 2011-06-05 – 2011-06-06 (×2): 10 mg via ORAL
  Filled 2011-06-04 (×4): qty 1

## 2011-06-04 MED ORDER — ACETAMINOPHEN 10 MG/ML IV SOLN
1000.0000 mg | Freq: Four times a day (QID) | INTRAVENOUS | Status: DC
  Filled 2011-06-04: qty 100

## 2011-06-04 MED ORDER — RIVAROXABAN 10 MG PO TABS
10.0000 mg | ORAL_TABLET | ORAL | Status: DC
  Administered 2011-06-05 – 2011-06-07 (×3): 10 mg via ORAL
  Filled 2011-06-04 (×3): qty 1

## 2011-06-04 MED ORDER — ACETAMINOPHEN 10 MG/ML IV SOLN
INTRAVENOUS | Status: DC | PRN
  Administered 2011-06-04: 1000 mg via INTRAVENOUS

## 2011-06-04 MED ORDER — HYDROMORPHONE HCL 2 MG PO TABS
2.0000 mg | ORAL_TABLET | ORAL | Status: DC | PRN
  Administered 2011-06-05 – 2011-06-06 (×4): 2 mg via ORAL
  Filled 2011-06-04 (×4): qty 1

## 2011-06-04 MED ORDER — BUPIVACAINE ON-Q PAIN PUMP (FOR ORDER SET NO CHG)
INJECTION | Status: DC
  Filled 2011-06-04: qty 1

## 2011-06-04 MED ORDER — DIPHENHYDRAMINE HCL 12.5 MG/5ML PO ELIX: 12.5000 mg | ORAL_SOLUTION | Freq: Four times a day (QID) | ORAL | Status: DC | PRN

## 2011-06-04 MED ORDER — PHENOL 1.4 % MT LIQD
1.0000 | OROMUCOSAL | Status: DC | PRN
  Filled 2011-06-04: qty 177

## 2011-06-04 MED ORDER — PANTOPRAZOLE SODIUM 40 MG PO TBEC
40.0000 mg | DELAYED_RELEASE_TABLET | Freq: Every day | ORAL | Status: DC
  Administered 2011-06-05 – 2011-06-07 (×3): 40 mg via ORAL
  Filled 2011-06-04 (×3): qty 1

## 2011-06-04 MED ORDER — METHOCARBAMOL 500 MG PO TABS
500.0000 mg | ORAL_TABLET | Freq: Four times a day (QID) | ORAL | Status: DC | PRN
  Administered 2011-06-05 – 2011-06-06 (×3): 500 mg via ORAL
  Filled 2011-06-04 (×3): qty 1

## 2011-06-04 MED ORDER — MIDAZOLAM HCL 5 MG/5ML IJ SOLN
INTRAMUSCULAR | Status: DC | PRN
  Administered 2011-06-04: 1 mg via INTRAVENOUS
  Administered 2011-06-04: 2 mg via INTRAVENOUS

## 2011-06-04 SURGICAL SUPPLY — 52 items
BAG SPEC THK2 15X12 ZIP CLS (MISCELLANEOUS) ×1
BAG ZIPLOCK 12X15 (MISCELLANEOUS) ×2 IMPLANT
BANDAGE ELASTIC 6 VELCRO ST LF (GAUZE/BANDAGES/DRESSINGS) ×2 IMPLANT
BANDAGE ESMARK 6X9 LF (GAUZE/BANDAGES/DRESSINGS) ×1 IMPLANT
BLADE SAG 18X100X1.27 (BLADE) ×2 IMPLANT
BLADE SAW SGTL 11.0X1.19X90.0M (BLADE) ×2 IMPLANT
BNDG CMPR 9X6 STRL LF SNTH (GAUZE/BANDAGES/DRESSINGS) ×1
BNDG ESMARK 6X9 LF (GAUZE/BANDAGES/DRESSINGS) ×2
BOWL SMART MIX CTS (DISPOSABLE) ×2 IMPLANT
CATH KIT ON-Q SILVERSOAK 5 (CATHETERS) ×1 IMPLANT
CATH KIT ON-Q SILVERSOAK 5IN (CATHETERS) ×2 IMPLANT
CEMENT HV SMART SET (Cement) ×4 IMPLANT
CLOTH BEACON ORANGE TIMEOUT ST (SAFETY) ×2 IMPLANT
CUFF TOURN SGL QUICK 34 (TOURNIQUET CUFF) ×2
CUFF TRNQT CYL 34X4X40X1 (TOURNIQUET CUFF) ×1 IMPLANT
DRAPE EXTREMITY T 121X128X90 (DRAPE) ×2 IMPLANT
DRAPE POUCH INSTRU U-SHP 10X18 (DRAPES) ×2 IMPLANT
DRAPE U-SHAPE 47X51 STRL (DRAPES) ×2 IMPLANT
DRSG ADAPTIC 3X8 NADH LF (GAUZE/BANDAGES/DRESSINGS) ×2 IMPLANT
DURAPREP 26ML APPLICATOR (WOUND CARE) ×2 IMPLANT
ELECT REM PT RETURN 9FT ADLT (ELECTROSURGICAL) ×2
ELECTRODE REM PT RTRN 9FT ADLT (ELECTROSURGICAL) ×1 IMPLANT
EVACUATOR 1/8 PVC DRAIN (DRAIN) ×2 IMPLANT
FACESHIELD LNG OPTICON STERILE (SAFETY) ×10 IMPLANT
GLOVE BIO SURGEON STRL SZ7.5 (GLOVE) ×2 IMPLANT
GLOVE BIO SURGEON STRL SZ8 (GLOVE) ×2 IMPLANT
GLOVE BIOGEL PI IND STRL 8 (GLOVE) ×2 IMPLANT
GLOVE BIOGEL PI INDICATOR 8 (GLOVE) ×2
GOWN PREVENTION PLUS XLARGE (GOWN DISPOSABLE) ×2 IMPLANT
GOWN STRL REIN XL XLG (GOWN DISPOSABLE) ×2 IMPLANT
HANDPIECE INTERPULSE COAX TIP (DISPOSABLE) ×2
IMMOBILIZER KNEE 20 (SOFTGOODS) ×2
IMMOBILIZER KNEE 20 THIGH 36 (SOFTGOODS) ×1 IMPLANT
KIT BASIN OR (CUSTOM PROCEDURE TRAY) ×2 IMPLANT
MANIFOLD NEPTUNE II (INSTRUMENTS) ×2 IMPLANT
NS IRRIG 1000ML POUR BTL (IV SOLUTION) ×2 IMPLANT
PACK TOTAL JOINT (CUSTOM PROCEDURE TRAY) ×2 IMPLANT
PAD ABD 7.5X8 STRL (GAUZE/BANDAGES/DRESSINGS) ×2 IMPLANT
PADDING CAST COTTON 6X4 STRL (CAST SUPPLIES) ×6 IMPLANT
POSITIONER SURGICAL ARM (MISCELLANEOUS) ×2 IMPLANT
SET HNDPC FAN SPRY TIP SCT (DISPOSABLE) ×1 IMPLANT
SPONGE GAUZE 4X4 12PLY (GAUZE/BANDAGES/DRESSINGS) ×2 IMPLANT
STRIP CLOSURE SKIN 1/2X4 (GAUZE/BANDAGES/DRESSINGS) ×4 IMPLANT
SUCTION FRAZIER 12FR DISP (SUCTIONS) ×2 IMPLANT
SUT MNCRL AB 4-0 PS2 18 (SUTURE) ×2 IMPLANT
SUT PDS AB 1 CT1 27 (SUTURE) ×6 IMPLANT
SUT VIC AB 2-0 CT1 27 (SUTURE) ×6
SUT VIC AB 2-0 CT1 TAPERPNT 27 (SUTURE) ×3 IMPLANT
TOWEL OR 17X26 10 PK STRL BLUE (TOWEL DISPOSABLE) ×4 IMPLANT
TRAY FOLEY CATH 14FRSI W/METER (CATHETERS) ×2 IMPLANT
WATER STERILE IRR 1500ML POUR (IV SOLUTION) ×2 IMPLANT
WRAP KNEE MAXI GEL POST OP (GAUZE/BANDAGES/DRESSINGS) ×4 IMPLANT

## 2011-06-04 NOTE — Interval H&P Note (Signed)
History and Physical Interval Note:   06/04/2011   6:58 AM   [name]  has presented today for surgery, with the diagnosis of Osteoarthritis of the Right Knee  The various methods of treatment have been discussed with the patient and family. After consideration of risks, benefits and other options for treatment, the patient has consented to  Procedure(s): TOTAL KNEE ARTHROPLASTY as a surgical intervention .  The patients' history has been reviewed, patient examined, no change in status, stable for surgery.  I have reviewed the patients' chart and labs.  Questions were answered to the patient's satisfaction.     Loanne Drilling  MD  Pt examined. History and physical unchanged  Gus Rankin. Beauden Tremont, MD    06/04/2011, 6:58 AM

## 2011-06-04 NOTE — Op Note (Signed)
Pre-operative diagnosis- Osteoarthritis Right knee(s)  Post-operative diagnosis- Osteoarthritis  Right knee(s)  Surgeon- Gus Rankin. Leonardo Plaia, MD  Assistant- Avel Peace, PA-C   Anesthesia-  Spinal   EBL- * No blood loss amount entered *   Drains  Hemovac   Tourniquet time  Total Tourniquet Time Documented: Thigh (Right) - 38 minutes   Complications- None  Condition-PACU - hemodynamically stable.   Brief Clinical Note  Brief Clinical Note  Dawn Collins is a 65 y.o. year old female with end stage OA of her right knee with progressively worsening pain and dysfunction. She has constant pain, with activity and at rest and significant functional deficits with difficulties even with ADLs. She has had extensive non-op management including analgesics, injections of cortisone and viscosupplements, and home exercise program, but remains in significant pain with significant dysfunction. She has bone on bone arthritis of her lateral and patellofemoral compartments with a large valgus deformity and large osteophytes.She presents now for left Total Knee Arthroplasty.   Procedure in detail---       The patient is brought into the operating room and positioned supine on the operating table. After successful administration of Spinal anesthetic, a tourniquet is placed high on the Right thigh(s) and the lower extremity is prepped and draped in the usual sterile fashion. Time out is performed by the operating team and then the Right  lower extremity is wrapped in Esmarch, knee flexed and the tourniquet inflated to 300 mmHg.       A midline incision is made with a ten blade through the subcutaneous tissue to the level of the extensor mechanism. A fresh blade is used to make a lateral parapatellar arthrotomy due to the patients' valgus deformity. Soft tissue over the proximal lateral tibia is subperiosteally elevated to the joint line with a knife to the posterolateral corner but not including the structures of  the posterolateral corner. Soft tissue over the proximal medial tibia is elevated with attention being paid to avoiding the patellar tendon on the tibial tubercle. The patella is everted medially, knee flexed 90 degrees and the ACL and PCL are removed. Findings are bone on bone lateral and patellofemoral with large marginal osteophytes and significant synovitis .       The drill is used to create a starting hole in the distal femur and the canal is thoroughly irrigated with sterile saline to remove the fatty contents. The 5 degree Right  valgus alignment guide is placed into the femoral canal and the distal femoral cutting block is pinned to remove 11  mm off the distal femur. Resection is made with an oscillating saw.      The tibia is subluxed forward and the menisci are removed. The extramedullary alignment guide is placed referencing proximally at the medial aspect of the tibial tubercle and distally along the second metatarsal axis and tibial crest. The block is pinned to remove 2mm off the more deficient lateral side. Resection is made with an oscillating saw. Size 2.5  is the most appropriate size for the tibia and the proximal tibia is prepared with the modular drill and keel punch for that size.      The femoral sizing guide is placed and size 3  is most appropriate. Rotation is marked off the epicondylar axis and confirmed by creating a rectangular flexion gap at 90 degrees. The size 3  cutting block is pinned in this rotation and the anterior, posterior and chamfer cuts are made with the oscillating saw. The intercondylar  block is then placed and that cut is made.      Trial size 2.5  tibial component, trial size 3  posterior stabilized femur and a 12.5  mm posterior stabilized rotating platform insert trial is placed. Full extension is achieved with excellent varus/valgus and   anterior/posterior balance throughout full range of motion. The patella is everted and thickness measured to be 22  mm. Free  hand resection is taken to 13 mm, a 38 template is placed, lug holes are drilled, trial patella is placed, and it tracks normally. Osteophytes are removed off the posterior femur with the trial in place. All trials are removed and the cut bone surfaces prepared with pulsatile lavage. Cement is mixed and once ready for implantation, the size 2.5  tibial implant, size 3 posterior stabilized femoral component, and the size 38  patella are cemented in place and the patella is held with the clamp. The trial insert is placed and the knee held in full extension. All extruded cement is removed and once the cement is hard the permanent 12.5  mm posterior stabilized rotating platform insert is placed into the tibial tray.      The wound is copiously irrigated with saline solution and the tourniquet is released for a total   tourniquet time of 38  minutes. Bleeding is identified and controlled with electrocautery. The extensor mechanism is closed with interrupted #1 PDS leaving open a small area from the superior to inferior pole of the patella to serve as a mini lateral release. Flexion against gravity is 140  degrees and the patella tracks normally. Subcutaneous tissue is closed with 2.0 vicryl and subcuticular with running 4.0 Monocryl. The catheter for the Marcaine pain pump is placed and the pump is initiated. The incision is cleaned and dried and steri-strips and a bulky sterile dressing are applied. The limb is placed into a knee immobilizer and the patient is awakened and transported to recovery in stable condition.      Please note that a surgical assistant was a medical necessity for this procedure in order to perform it in a safe and expeditious manner. Surgical assistant was necessary to retract the ligaments and vital neurovascular structures to prevent injury to them and also necessary for proper positioning of the limb to allow for anatomic placement of the prosthesis.    Gus Rankin Japji Kok, MD  Gus Rankin.  Klye Besecker, MD    06/04/2011, 8:39 AM    06/04/2011, 8:34 AM

## 2011-06-04 NOTE — Progress Notes (Signed)
Utilization review completed.  

## 2011-06-04 NOTE — H&P (View-Only) (Signed)
  Dawn Collins DOB: 06/20/1946  Date of Admission:  06/04/2011  Chief Complaint:  Right Knee Pain  History of Present Illness: The patient is a 65 year old female who comes in today for a preoperative History and Physical. The patient is scheduled for a right total knee arthroplasty to be performed by Dr. Frank V. Aluisio, MD at [facility] on 06/04/2011.  Allergies: Aspirin - GI upset Avelox - Rash Biaxin - GI upset Celebrex - Chest Pain [place] Derivatives - Sweating, vomiting, shortness of breath Duraphen - Palpitations Floxin - Rash Hycodan Syrup - Dyspnea Penicillins - Rash Pentazocine-Naloxone HCL - Chest pain, fever, jaw pain Septra - Vomiting Stadol - Nausea, vomiting Talwin NX - Numbness Zoloft - Tinnitus  Medications: Amiloride-HCTZ 5-50 Aspirin 81mg  Cartia XT 300 mg Doxycycline Hyclate 100 mg Estradiol 2 mg Meloxicam 15 mg Metronidazole 0.75% gel Pantoprazole Sodium 40 mg ProAir HFA Singulair 10 mg  Problem List/Past Medical History: Anxiety Disorder Hypertension: Asthma Asthmatic Bronchitis Fibrocystic Breast Disease Carpal Tunnel Syndrome, Bilateral Cervical Spine Stenosis Diverticulitis Of Colon Diverticulosis Gastroesophageal Reflux Disease Fatty Liver Headaches Insomia Irritable bowel syndrome Lumbar Radiculopathy Nontoxic Multinodular Goiter Sleep Apnea. Mild Obstructive Spinal Stenosis History of Bursitis History of Sinusitis Urinary Incontinence. Stress  Family History: Father. Deceased, Emphysema, Prostate Cancer. Age 80 Mother. Deceased, Breast Cancer, Brain Cancer, Kidney disease. Age 80  Social History: Tobacco use. Never smoker. Alcohol use. Never consumed alcohol. Marital status. Married. Children. 1 son Living situation. Lives with spouse. Current work status. Full-time. [employer] Post-Surgical Plans. Plan is for Home  Past Surgical History: Tubal Ligation Hysterectomy  (not due to cancer) - Partial Breast Surgery, Bilateral Breast Surgery Nipple Exploartion, Bilateral Foot Surgery, Bilateral Arthroscopic Knee Surgery - Right Neck Disc Surgery. Date: 1977. Cervical Lam Cardiac Cath. Date: 09/2010. Dr. Rhobeck  ROS: General: No chills, fevers, night sweats, fatigue. Neuro:  No seizures, syncope, paralysis, visual problems. Respiratory:  Positive for shortness of breath at rest; no productive cough, hemoptysis. Cardiovascular: No chest pain, angina, palpitations, orthopnea. GI: No nausea, vomiting, diarrhea, constipation. Positive for heartburn. GU:  No dysuria, hematuria, discharge. Musculoskeletal:  Joint pain, back pain, and morning stiffness.  Vitals: Weight: 182 lb Height: 65.75 in Weight was reported by patient. Height was reported by patient. Body Surface Area: 1.96 m Body Mass Index: 29.6 kg/m Pulse: 76 (Regular) Resp.: 12 (Unlabored) BP: 142/78 (Sitting, Left Arm, Standard)  Physical Exam: GENERAL: Patient is a 65 y.o. female, well-nourished, well-developed, no acute distress. Alert, oriented, cooperative. Good historian. HENT:  Normocephalic, atraumatic. Pupils round and reactive. EOMs intact. NECK:  Supple, no bruits. CHEST:  Clear to anterior and posterior chest walls. No rhonchi, rales, wheezes. HEART:  Regular, rate and rhythm.  No murmurs.  S1 and S2 noted. ABDOMEN:  Soft, nontender, bowel sounds present, round abdomen. RECTAL/BREAST/GENITALIA:  Not done, not pertinent to present illness. EXTREMITIES:  Valgus deformity, motor intact, 0-120 degrees, marked crepitus.   Assessment & Plan: Osteoarthrosis NOS, lower leg (715.96) Impression: Right Knee  Patient will be admitted for a right total knee replacement. Risks and benefits of the surgery have been discussed with the patient and they elect to proceed with surgery.  There are on active contraindications to upcoming procedure such as ongoing infection or  progressive neurological disease.  Plan is to go home with her husband and sister.  Drew Perkins, PA-C 

## 2011-06-04 NOTE — Anesthesia Postprocedure Evaluation (Signed)
  Anesthesia Post-op Note  Patient: Dawn Collins  Procedure(s) Performed:  TOTAL KNEE ARTHROPLASTY  Patient Location: PACU  Anesthesia Type: General  Level of Consciousness: awake and alert   Airway and Oxygen Therapy: Patient Spontanous Breathing  Post-op Pain: mild  Post-op Assessment: Post-op Vital signs reviewed, Patient's Cardiovascular Status Stable, Respiratory Function Stable, Patent Airway and No signs of Nausea or vomiting  Post-op Vital Signs: stable  Complications: No apparent anesthesia complications

## 2011-06-04 NOTE — Anesthesia Preprocedure Evaluation (Addendum)
Anesthesia Evaluation  Patient identified by MRN, date of birth, ID band Patient awake    Reviewed: Allergy & Precautions, H&P , NPO status , Patient's Chart, lab work & pertinent test results  Airway Mallampati: II TM Distance: >3 FB Neck ROM: Full    Dental No notable dental hx. (+) Teeth Intact   Pulmonary neg pulmonary ROS, asthma , sleep apnea and Continuous Positive Airway Pressure Ventilation ,  clear to auscultation  Pulmonary exam normal       Cardiovascular hypertension, Pt. on medications neg cardio ROS Regular Normal    Neuro/Psych Negative Neurological ROS  Negative Psych ROS   GI/Hepatic negative GI ROS, Neg liver ROS, GERD-  ,  Endo/Other  Negative Endocrine ROSMorbid obesity  Renal/GU negative Renal ROS  Genitourinary negative   Musculoskeletal negative musculoskeletal ROS (+)   Abdominal   Peds negative pediatric ROS (+)  Hematology negative hematology ROS (+)   Anesthesia Other Findings   Reproductive/Obstetrics negative OB ROS                          Anesthesia Physical Anesthesia Plan  ASA: III  Anesthesia Plan: Spinal   Post-op Pain Management:    Induction: Intravenous  Airway Management Planned:   Additional Equipment:   Intra-op Plan:   Post-operative Plan: Extubation in OR  Informed Consent: I have reviewed the patients History and Physical, chart, labs and discussed the procedure including the risks, benefits and alternatives for the proposed anesthesia with the patient or authorized representative who has indicated his/her understanding and acceptance.   Dental advisory given  Plan Discussed with: CRNA  Anesthesia Plan Comments:         Anesthesia Quick Evaluation

## 2011-06-04 NOTE — Anesthesia Procedure Notes (Addendum)
Spinal   Spinal  Patient location during procedure: OR Staffing Performed by: anesthesiologist  Preanesthetic Checklist Completed: patient identified, site marked, surgical consent, pre-op evaluation, timeout performed, IV checked, risks and benefits discussed and monitors and equipment checked Spinal Block Patient position: sitting Prep: Betadine Patient monitoring: heart rate, continuous pulse ox and blood pressure Approach: midline Location: L4-5 Injection technique: single-shot Needle Needle type: Spinocan  Needle gauge: 22 G Needle length: 9 cm Additional Notes Expiration date of kit checked and confirmed. Patient tolerated procedure well, without complications.     

## 2011-06-04 NOTE — Transfer of Care (Signed)
Immediate Anesthesia Transfer of Care Note  Patient: Dawn Collins  Procedure(s) Performed:  TOTAL KNEE ARTHROPLASTY  Patient Location: PACU  Anesthesia Type: Regional  Level of Consciousness: awake, alert  and oriented  Airway & Oxygen Therapy: Patient Spontanous Breathing and Patient connected to face mask oxygen  Post-op Assessment: Report given to PACU RN and Post -op Vital signs reviewed and stable  Post vital signs: Reviewed and stable  Complications: No apparent anesthesia complications

## 2011-06-05 LAB — CBC
Platelets: 267 10*3/uL (ref 150–400)
RBC: 3.68 MIL/uL — ABNORMAL LOW (ref 3.87–5.11)
WBC: 10.9 10*3/uL — ABNORMAL HIGH (ref 4.0–10.5)

## 2011-06-05 LAB — BASIC METABOLIC PANEL
CO2: 27 mEq/L (ref 19–32)
Chloride: 99 mEq/L (ref 96–112)
Sodium: 134 mEq/L — ABNORMAL LOW (ref 135–145)

## 2011-06-05 MED ORDER — HYDROMORPHONE HCL PF 1 MG/ML IJ SOLN: 0.5000 mg | INTRAMUSCULAR | Status: DC | PRN

## 2011-06-05 MED ORDER — KCL IN DEXTROSE-NACL 20-5-0.9 MEQ/L-%-% IV SOLN
INTRAVENOUS | Status: DC
  Filled 2011-06-05: qty 1000

## 2011-06-05 NOTE — Progress Notes (Signed)
Physical Therapy Evaluation Patient Details Name: Dawn Collins MRN: [id number] DOB: 02-17-1946 Today's Date: 06/05/2011  Problem List:  Patient Active Problem List  Diagnoses  . Osteoarthritis of right knee    Past Medical History:  Past Medical History  Diagnosis Date  . Hypertension   . Asthma     2 weeks cold,no problems now-well controlled  . Sleep apnea     No cpap now -3 months last due to insurance reasons  . GERD (gastroesophageal reflux disease)     tx. pantoprazole  . Diverticulitis of colon 05-28-11    no current problems  . Arthritis     knees, back.  . Bursitis, knee     Bil. Hips, not knee   Past Surgical History:  Past Surgical History  Procedure Date  . Abdominal hysterectomy   . Cervical disc surgery     1977  . Breast biopsy     several- all benign  . Cardiac catheterization     10'11  . Knee arthroscopy     right '04  . Foot surgery     Bilateral    PT Assessment/Plan/Recommendation PT Assessment Clinical Impression Statement: pt will benefit from PT to maximize independence for home PT Recommendation/Assessment: Patient will need skilled PT in the acute care venue PT Problem List: Decreased strength;Decreased range of motion;Decreased activity tolerance;Pain;Decreased knowledge of use of DME;Decreased mobility PT Therapy Diagnosis : Difficulty walking;Acute pain PT Plan PT Frequency: 7X/week PT Treatment/Interventions: DME instruction;Gait training;Functional mobility training;Therapeutic exercise;Balance training;Patient/family education PT Recommendation Follow Up Recommendations: Home health PT Equipment Recommended: Rolling walker with 5" wheels PT Goals  Acute Rehab PT Goals PT Goal Formulation: With patient Time For Goal Achievement: 7 days Pt will go Supine/Side to Sit: with supervision;with HOB 0 degrees PT Goal: Supine/Side to Sit - Progress: Progressing toward goal Pt will go Sit to Supine/Side: with min assist;with HOB 0  degrees PT Goal: Sit to Supine/Side - Progress: Progressing toward goal Pt will Transfer Sit to Stand/Stand to Sit: with supervision PT Transfer Goal: Sit to Stand/Stand to Sit - Progress: Progressing toward goal Pt will Ambulate: 51 - 150 feet;with rolling walker;with supervision PT Goal: Ambulate - Progress: Progressing toward goal Pt will Perform Home Exercise Program: with supervision, verbal cues required/provided PT Goal: Perform Home Exercise Program - Progress: Progressing toward goal  PT Evaluation Precautions/Restrictions  Precautions Precautions: Knee Precaution Comments: no pillow under knee Restrictions Weight Bearing Restrictions: No RLE Weight Bearing: Weight bearing as tolerated Prior Functioning  Home Living Lives With: Spouse Receives Help From: Family Type of Home: House Home Layout: One level Home Access: Stairs to enter Entrance Stairs-Rails: None Entrance Stairs-Number of Steps: 1 Home Adaptive Equipment: None Prior Function Level of Independence: Independent with gait;Independent with transfers Driving: Yes 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 Cognition Cognition Arousal/Alertness: Awake/alert Overall Cognitive Status: Appears within functional limits for tasks assessed Orientation Level: Oriented X4 Sensation/Coordination   Extremity Assessment RLE Strength Right Hip Flexion: 2/5 Right Knee Extension: 2/5 Right Ankle Dorsiflexion: 4/5 Right Ankle Plantar Flexion: 4/5 LLE Assessment LLE Assessment: Within Functional Limits Mobility (including Balance) Bed Mobility Supine to Sit: 4: Min assist;HOB flat Supine to Sit Details (indicate cue type and reason): min A for RLE, verbal cues to scoot to EOB Transfers Sit to Stand: 4: Min assist;From bed Sit to Stand Details (indicate cue type and reason): verbal cues for RLE position and hand placement Stand to Sit: 4: Min assist Stand to Sit Details: verbal cues for  RLE amd hand  placement Ambulation/Gait Ambulation/Gait Assistance: 4: Min assist Ambulation/Gait Assistance Details (indicate cue type and reason): verbal cues for RW distance from self, sequence and posture Ambulation Distance (Feet): 8 Feet Assistive device: Rolling walker    Exercise  Total Joint Exercises Ankle Circles/Pumps: AROM;Both;10 reps End of Session PT - End of Session Equipment Utilized During Treatment: Gait belt;Right knee immobilizer Activity Tolerance: Patient tolerated treatment well;Patient limited by pain Patient left: in chair General Behavior During Session: [facility] for tasks performed Cognition: [facility] for tasks performed  [facility] 06/05/2011, 10:36 AM

## 2011-06-05 NOTE — Progress Notes (Signed)
Physical Therapy Treatment Patient Details Name: Dawn Collins MRN: [id number] DOB: [birth year] Today's Date: 06/05/2011 [id number]     1 Gt 1 te PT Assessment/Plan  PT - Assessment/Plan PT Plan: Discharge plan remains appropriate PT Frequency: 7X/week Follow Up Recommendations: Home health PT Equipment Recommended: Rolling walker with 5" wheels PT Goals  Acute Rehab PT Goals PT Goal Formulation: With patient Time For Goal Achievement: 7 days Pt will go Supine/Side to Sit: with supervision;with HOB 0 degrees PT Goal: Supine/Side to Sit - Progress: Progressing toward goal Pt will go Sit to Supine/Side: with min assist;with HOB 0 degrees PT Goal: Sit to Supine/Side - Progress: Progressing toward goal Pt will Transfer Sit to Stand/Stand to Sit: with supervision PT Transfer Goal: Sit to Stand/Stand to Sit - Progress: Progressing toward goal Pt will Ambulate: 51 - 150 feet;with rolling walker;with supervision PT Goal: Ambulate - Progress: Progressing toward goal Pt will Perform Home Exercise Program: with supervision, verbal cues required/provided PT Goal: Perform Home Exercise Program - Progress: Progressing toward goal  PT Treatment Precautions/Restrictions  Precautions Precautions: Knee Precaution Comments: no pillow under knee Restrictions Weight Bearing Restrictions: No RLE Weight Bearing: Weight bearing as tolerated Mobility (including Balance) Bed Mobility Supine to Sit: 4: Min assist;HOB flat Supine to Sit Details (indicate cue type and reason): min A for RLE, verbal cues to scoot to EOB Sit to Supine - Left: 4: Min assist;HOB flat Sit to Supine - Left Details (indicate cue type and reason): min assist with RLE Transfers Transfers: Yes Sit to Stand: 4: Min assist;From chair/3-in-1 Sit to Stand Details (indicate cue type and reason): verbal cues for hand placement Stand to Sit: 4: Min assist;To bed Stand to Sit Details: verbal cues for RLE position and hand  placement Ambulation/Gait Ambulation/Gait Assistance: 4: Min assist Ambulation/Gait Assistance Details (indicate cue type and reason): verbal cues for sequence Ambulation Distance (Feet): 10 Feet Assistive device: Rolling walker    Exercise  Total Joint Exercises Ankle Circles/Pumps: AROM;Both;10 reps Quad Sets: AROM;10 reps;Supine Short Arc Quad: AAROM;Right;10 reps Heel Slides: AAROM;Right;10 reps;Supine Straight Leg Raises: AAROM;Right;10 reps;Supine End of Session PT - End of Session Equipment Utilized During Treatment: Gait belt;Right knee immobilizer Activity Tolerance: Patient tolerated treatment well;Patient limited by pain Patient left: in bed General Behavior During Session: [facility] for tasks performed Cognition: [facility] for tasks performed  [facility] [phone] 06/05/2011, 12:35 PM

## 2011-06-05 NOTE — Progress Notes (Signed)
Subjective: 1 Day Post-Op Procedure(s) (LRB): TOTAL KNEE ARTHROPLASTY (Right) Patient reports pain as mild.   Patient seen in rounds with Dr. Lequita Halt. Patient has complaints of nausea yesterday but none this morning so far.  We will start therapy today. Plan is to go home after hospital stay.  Objective: Vital signs in last 24 hours: Temp:  [97.1 F (36.2 C)-97.7 F (36.5 C)] 97.7 F (36.5 C) (11/13 0600) Pulse Rate:  [54-72] 70  (11/13 0600) Resp:  [12-16] 16  (11/13 0600) BP: (113-162)/(66-89) 155/80 mmHg (11/13 0600) SpO2:  [97 %-100 %] 100 % (11/13 0600) Weight:  [81.194 kg (179 lb)] 179 lb (81.194 kg) (11/12 1100)  Intake/Output from previous day:  Intake/Output Summary (Last 24 hours) at 06/05/11 0822 Last data filed at 06/05/11 0600  Gross per 24 hour  Intake   2440 ml  Output   3960 ml  Net  -1520 ml    Intake/Output this shift:     Basename 06/05/11 0410  HGB 11.0*    Basename 06/05/11 0410  WBC 10.9*  RBC 3.68*  HCT 32.0*  PLT 267    Basename 06/05/11 0410  NA 134*  K 4.0  CL 99  CO2 27  BUN 7  CREATININE 0.62  GLUCOSE 169*  CALCIUM 8.9   No results found for this basename: LABPT:2,INR:2 in the last 72 hours  Exam - Neurovascular intact Sensation intact distally Intact pulses distally Dressing - clean, dry Motor function intact - moving foot and toes well on exam.  Hemovac pulled without difficulty.  Assessment/Plan: 1 Day Post-Op Procedure(s) (LRB): TOTAL KNEE ARTHROPLASTY (Right)  Advance diet Up with therapy Discharge home with home health when met goals Past Medical History  Diagnosis Date  . Hypertension   . Asthma     2 weeks cold,no problems now-well controlled  . Sleep apnea     No cpap now -3 months last due to insurance reasons  . GERD (gastroesophageal reflux disease)     tx. pantoprazole  . Diverticulitis of colon 05-28-11    no current problems  . Arthritis     knees, back.  . Bursitis, knee     Bil. Hips, not  knee    DVT Prophylaxis - Xarelto  Protocol Weight-Bearing as tolerated to Right leg Keep foley until tomorrow.  Olyvia Gopal 06/05/2011, 8:22 AM

## 2011-06-06 LAB — CBC
HCT: 30.8 % — ABNORMAL LOW (ref 36.0–46.0)
Hemoglobin: 10.5 g/dL — ABNORMAL LOW (ref 12.0–15.0)
MCV: 88.3 fL (ref 78.0–100.0)
RBC: 3.49 MIL/uL — ABNORMAL LOW (ref 3.87–5.11)
WBC: 10.8 10*3/uL — ABNORMAL HIGH (ref 4.0–10.5)

## 2011-06-06 LAB — BASIC METABOLIC PANEL
BUN: 10 mg/dL (ref 6–23)
CO2: 29 mEq/L (ref 19–32)
Chloride: 98 mEq/L (ref 96–112)
Creatinine, Ser: 0.69 mg/dL (ref 0.50–1.10)

## 2011-06-06 MED ORDER — SODIUM CHLORIDE 0.9 % IV BOLUS (SEPSIS)
500.0000 mL | Freq: Once | INTRAVENOUS | Status: AC
  Administered 2011-06-06: 500 mL via INTRAVENOUS

## 2011-06-06 MED ORDER — SODIUM CHLORIDE 0.9 % IV SOLN: INTRAVENOUS | Status: DC

## 2011-06-06 NOTE — Progress Notes (Signed)
Physical Therapy Treatment Patient Details Name: Dawn Collins MRN: [id number] DOB: March 01, 1946 Today's Date: 06/06/2011 [id number] 1Gt,1Te  PT Assessment/Plan  PT - Assessment/Plan Comments on Treatment Session: Patient doing well with knee flexion, but still with significant quad weakness.  Mobility limited due to nausea. PT Plan: Discharge plan remains appropriate PT Frequency: 7X/week Follow Up Recommendations: Home health PT Equipment Recommended: None recommended by OT PT Goals  Acute Rehab PT Goals PT Goal: Supine/Side to Sit - Progress: Progressing toward goal PT Transfer Goal: Sit to Stand/Stand to Sit - Progress: Progressing toward goal PT Goal: Ambulate - Progress: Progressing toward goal PT Goal: Perform Home Exercise Program - Progress: Progressing toward goal  PT Treatment Precautions/Restrictions  Precautions Precautions: Knee Precaution Comments: no pillow under knee Required Braces or Orthoses: Yes Knee Immobilizer: On when out of bed or walking;On except when in CPM;Discontinue once straight leg raise with < 10 degree lag Restrictions Weight Bearing Restrictions: No RLE Weight Bearing: Weight bearing as tolerated Mobility (including Balance) Bed Mobility Supine to Sit: 4: Min assist Supine to Sit Details (indicate cue type and reason): assist for right LE and cues to scoot Transfers Sit to Stand: 4: Min assist;From bed;From toilet;With upper extremity assist Sit to Stand Details (indicate cue type and reason): cues for hand placement Stand to Sit: 4: Min assist;With upper extremity assist;To chair/3-in-1;To toilet Stand to Sit Details: cues for right leg out Ambulation/Gait Ambulation/Gait Assistance: 4: Min assist Ambulation/Gait Assistance Details (indicate cue type and reason): cues for sequence Ambulation Distance (Feet): 10 Feet (x 2 to/from bathroom; limited due to nausea) Assistive device: Rolling walker    Exercise  Total Joint Exercises Ankle  Circles/Pumps: Right;AAROM;10 reps;Supine Quad Sets: AROM;Right;10 reps;Supine Short Arc Quad: AAROM;Right;Supine;10 reps Heel Slides: AAROM;10 reps;Right;Supine Hip ABduction/ADduction: AROM;10 reps;Supine;Right Straight Leg Raises: AAROM;Right;10 reps;Supine End of Session PT - End of Session Equipment Utilized During Treatment: Gait belt Activity Tolerance: Patient limited by fatigue (and by nausea) Patient left: in chair;with call bell in reach;with family/visitor present General Behavior During Session: [facility] for tasks performed Cognition: [facility] for tasks performed  [facility] 06/06/2011, 12:38 PM

## 2011-06-06 NOTE — Progress Notes (Signed)
Subjective: 2 Days Post-Op Procedure(s) (LRB): TOTAL KNEE ARTHROPLASTY (Right) Patient reports pain as mild.   Patient seen in rounds with Dr. Lequita Collins. Patient has complaints of some shortness of breath when getting up to bathroom earlier but now that has resolved.  Also was a little lightheaded.  No other complaints of CP or nausea.  I/O's show that she is actually negative on fluids so will rehydrate.  We will start therapy today. Plan is to go home after hospital stay.  Objective: Vital signs in last 24 hours: Temp:  [97.4 F (36.3 C)-98.5 F (36.9 C)] 98.3 F (36.8 C) (11/14 0600) Pulse Rate:  [64-74] 74  (11/14 0600) Resp:  [16-18] 18  (11/14 0600) BP: (107-152)/(63-78) 107/68 mmHg (11/14 0600) SpO2:  [97 %-100 %] 99 % (11/14 0600)  Intake/Output from previous day:  Intake/Output Summary (Last 24 hours) at 06/06/11 0648 Last data filed at 06/06/11 0600  Gross per 24 hour  Intake   2200 ml  Output   3675 ml  Net  -1475 ml    Intake/Output this shift: Total I/O In: 620 [P.O.:380; I.V.:240] Out: 1675 [Urine:1675]   Basename 06/06/11 0445 06/05/11 0410  HGB 10.5* 11.0*    Basename 06/06/11 0445 06/05/11 0410  WBC 10.8* 10.9*  RBC 3.49* 3.68*  HCT 30.8* 32.0*  PLT 264 267    Basename 06/06/11 0445 06/05/11 0410  NA 135 134*  K 3.3* 4.0  CL 98 99  CO2 29 27  BUN 10 7  CREATININE 0.69 0.62  GLUCOSE 113* 169*  CALCIUM 8.9 8.9   No results found for this basename: LABPT:2,INR:2 in the last 72 hours  Exam - Neurovascular intact Sensation intact distally Intact pulses distally Dressing - clean, dry, no drainage Motor function intact - moving foot and toes well on exam.  Hemovac pulled without difficulty.  Assessment/Plan: 2 Days Post-Op Procedure(s) (LRB): TOTAL KNEE ARTHROPLASTY (Right)   Past Medical History  Diagnosis Date  . Hypertension   . Asthma     2 weeks cold,no problems now-well controlled  . Sleep apnea     No cpap now -3 months last  due to insurance reasons  . GERD (gastroesophageal reflux disease)     tx. pantoprazole  . Diverticulitis of colon 05-28-11    no current problems  . Arthritis     knees, back.  . Bursitis, knee     Bil. Hips, not knee    DVT Prophylaxis - Xarelto  Protocol Weight-Bearing as tolerated to right leg No vaccines. Fluids today. Recheck BMET in AM  Dawn Collins, Dawn Collins 06/06/2011, 6:48 AM

## 2011-06-06 NOTE — Progress Notes (Signed)
Occupational Therapy Evaluation Patient Details Name: Dawn Collins MRN: [id number] DOB: 03/12/1946 Today's Date: 06/06/2011 Time in : 10:40am Time out: 11:04  Problem List:  Patient Active Problem List  Diagnoses  . Osteoarthritis of right knee    Past Medical History:  Past Medical History  Diagnosis Date  . Hypertension   . Asthma     2 weeks cold,no problems now-well controlled  . Sleep apnea     No cpap now -3 months last due to insurance reasons  . GERD (gastroesophageal reflux disease)     tx. pantoprazole  . Diverticulitis of colon 05-28-11    no current problems  . Arthritis     knees, back.  . Bursitis, knee     Bil. Hips, not knee   Past Surgical History:  Past Surgical History  Procedure Date  . Abdominal hysterectomy   . Cervical disc surgery     1977  . Breast biopsy     several- all benign  . Cardiac catheterization     10'11  . Knee arthroscopy     right '04  . Foot surgery     Bilateral    OT Assessment/Plan/Recommendation OT Assessment Clinical Impression Statement: Patient will benefit from skilled OT services in the acute setting in order to facilitate increased independence with ADL and safe return home. OT Recommendation/Assessment: Patient will need skilled OT in the acute care venue OT Problem List: Decreased strength;Decreased activity tolerance;Decreased knowledge of use of DME or AE;Pain OT Therapy Diagnosis : Generalized weakness;Acute pain OT Plan OT Frequency: Min 2X/week OT Treatment/Interventions: Self-care/ADL training;DME and/or AE instruction;Therapeutic activities;Patient/family education OT Recommendation Follow Up Recommendations:  (likely no OT followup needed if patient able to progress ) Equipment Recommended: None recommended by OT Individuals Consulted Consulted and Agree with Results and Recommendations: Patient OT Goals Acute Rehab OT Goals OT Goal Formulation: Patient unable to participate in goal setting  (patient too nauseaous) Time For Goal Achievement: 7 days ADL Goals Pt Will Perform Grooming: Standing at sink;with supervision ADL Goal: Grooming - Progress: Progressing toward goals Pt Will Perform Lower Body Bathing: with supervision;Sit to stand from chair;Sit to stand from bed ADL Goal: Lower Body Bathing - Progress: Progressing toward goals Pt Will Perform Lower Body Dressing: with supervision;Sit to stand from chair;Sit to stand from bed ADL Goal: Lower Body Dressing - Progress: Progressing toward goals Pt Will Transfer to Toilet: with supervision;Comfort height toilet;Other (comment) (with sink on left to hold) ADL Goal: Toilet Transfer - Progress: Progressing toward goals Pt Will Perform Toileting - Clothing Manipulation: with supervision;Standing ADL Goal: Toileting - Clothing Manipulation - Progress: Progressing toward goals Pt Will Perform Toileting - Hygiene: Sit to stand from 3-in-1/toilet;with supervision ADL Goal: Toileting - Hygiene - Progress: Progressing toward goals  OT Evaluation Precautions/Restrictions  Precautions Precautions: Knee Precaution Comments: no pillow under knee Restrictions Weight Bearing Restrictions: No RLE Weight Bearing: Weight bearing as tolerated Prior Functioning Home Living Bathroom Shower/Tub: Tub/shower unit Bathroom Toilet: Handicapped height (vanity on left) Home Adaptive Equipment: None Prior Function Level of Independence: Independent with basic ADLs ADL ADL Eating/Feeding: Independent;Simulated Where Assessed - Eating/Feeding: Chair Grooming: Simulated;Wash/dry face;Set up Where Assessed - Grooming: Sitting, chair;Supported Product manager: Not assessed Lower Body Bathing: Not assessed Upper Body Dressing: Not assessed Lower Body Dressing: Not assessed Toilet Transfer: Simulated;Minimal assistance Toilet Transfer Details (indicate cue type and reason): patient nauseaous and lightheaded in chair when OT entered. Assisted  patient back to bed to simulate toilet transfer. Statistician  Method: Stand pivot Toileting - Clothing Manipulation: Simulated;Minimal assistance Where Assessed - Glass blower/designer Manipulation: Standing Toileting - Hygiene: Simulated;Minimal assistance Where Assessed - Toileting Hygiene: Standing Tub/Shower Transfer: Not assessed Tub/Shower Transfer Method: Not assessed Equipment Used: Rolling walker ADL Comments: Evaluation limited as patient with alot of nausea when OT arrived. Patient also with lightheadedness and increased pain when transferring back to bed. Took BP and informed nursing of all. Nursing in at the end of session to see patient. Vision/Perception  Vision - History Patient Visual Report: Nausea/blurring vision with head movement Cognition Cognition Arousal/Alertness:  (patient feeling nauseaous so she closed eyes often) Overall Cognitive Status: Appears within functional limits for tasks assessed Orientation Level: Oriented X4 Sensation/Coordination Sensation Light Touch: Appears Intact Extremity Assessment RUE Assessment RUE Assessment: Within Functional Limits LUE Assessment LUE Assessment: Within Functional Limits Mobility    Exercises   End of Session OT - End of Session Equipment Utilized During Treatment: Gait belt (rolling walker) Activity Tolerance: Patient limited by pain (also limited by nausea ) Patient left: in bed;with call bell in reach Nurse Communication:  (informed nursing of nausea and lightheadedness) General Behavior During Session: [facility] for tasks performed Cognition: [facility] for tasks performed   Lennox Laity Pager [phone] 06/06/2011, 11:35 AM

## 2011-06-06 NOTE — Progress Notes (Signed)
Physical Therapy Treatment Patient Details Name: Dawn Collins MRN: [id number] DOB: June 27, 1946 Today's Date: 06/06/2011 1345-1405 1Gt  PT Assessment/Plan  PT - Assessment/Plan Comments on Treatment Session: Patient continues with weakness she feels is due to meds.  Ambulated further this pm than entire stay, but needs to tolerate more to allow for D/C PT Plan: Discharge plan remains appropriate PT Frequency: 7X/week Follow Up Recommendations: Home health PT Equipment Recommended: None recommended by OT PT Goals  Acute Rehab PT Goals PT Goal: Supine/Side to Sit - Progress: Progressing toward goal PT Transfer Goal: Sit to Stand/Stand to Sit - Progress: Progressing toward goal PT Goal: Ambulate - Progress: Progressing toward goal PT Goal: Perform Home Exercise Program - Progress: Progressing toward goal  PT Treatment Precautions/Restrictions  Precautions Precautions: Knee Precaution Comments: no pillow under knee Required Braces or Orthoses: Yes Knee Immobilizer: On except when in CPM;Discontinue once straight leg raise with < 10 degree lag;On when out of bed or walking Restrictions Weight Bearing Restrictions: No RLE Weight Bearing: Weight bearing as tolerated Mobility (including Balance) Bed Mobility Supine to Sit: 4: Min assist;With rails Supine to Sit Details (indicate cue type and reason): assist for right LE and cues to scoot Transfers Sit to Stand: 4: Min assist;From bed Sit to Stand Details (indicate cue type and reason): cues for hand placement Stand to Sit: 4: Min assist;To chair/3-in-1 Stand to Sit Details: cues for right leg out Ambulation/Gait Ambulation/Gait Assistance: 4: Min assist Ambulation/Gait Assistance Details (indicate cue type and reason): cues for encouragement and walker placement Ambulation Distance (Feet): 37 Feet Assistive device: Rolling walker    Exercise   End of Session PT - End of Session Equipment Utilized During Treatment: Gait  belt Activity Tolerance: Patient limited by fatigue Patient left: in chair General Behavior During Session: [facility] for tasks performed Cognition: [facility] for tasks performed  [facility] 06/06/2011, 2:59 PM

## 2011-06-07 ENCOUNTER — Encounter (HOSPITAL_COMMUNITY): Payer: Self-pay | Admitting: Orthopedic Surgery

## 2011-06-07 LAB — CBC
HCT: 30.8 % — ABNORMAL LOW (ref 36.0–46.0)
MCHC: 33.8 g/dL (ref 30.0–36.0)
MCV: 88.8 fL (ref 78.0–100.0)
RDW: 13.1 % (ref 11.5–15.5)
WBC: 9 10*3/uL (ref 4.0–10.5)

## 2011-06-07 MED ORDER — METHOCARBAMOL 500 MG PO TABS: 500.0000 mg | ORAL_TABLET | Freq: Four times a day (QID) | ORAL | Status: AC | PRN

## 2011-06-07 MED ORDER — RIVAROXABAN 10 MG PO TABS: 10.0000 mg | ORAL_TABLET | ORAL | Status: DC

## 2011-06-07 MED ORDER — TRAMADOL HCL 50 MG PO TABS: 50.0000 mg | ORAL_TABLET | Freq: Four times a day (QID) | ORAL | Status: AC | PRN

## 2011-06-07 NOTE — Progress Notes (Signed)
Occupational Therapy Note Spoke to patient and she states she was able to do her own bath this am and has already been up to the bathroom. She reports no difficulty transferring on/off toilet and also states her toilet is higher at home. She declines need for a 3in1. Patient reports she is supposed to discharge home today. She does not feel she needs any further OT. Husband present in room and can assist at discharge PRN. Judithann Sauger OTR/L [phone]

## 2011-06-07 NOTE — Clinical Documentation Improvement (Signed)
Clinical Documentation Improvement Program Query   Dear Dr. Mervyn Skeeters  In an effort to better capture your patient's severity of illness, reflect appropriate length of stay and utilization of resources, a review of the patient medical record has revealed the following indicators. Please exercise your independent judgment. The fact queries are asked, does not imply that any particular answer is desired or expected. Please clarify and document in a progress note and/or discharge summary the clinical condition associated with the following supporting information. Conditions documented as possible, probable, or suspected can be coded if restated at the time of discharge.   Verification of surgical site   According to Op note on 06/04/11 pt with Osteoarthritis Right knee(s)  The procedure on Op note for 06/04/11 states "She presents now for left Total Knee Arthroplasty."     Please verify surgical site in Op note.   Thank you. If you have any questions, please contact me:  Enis Slipper [phone] Clinical Documentation Specialist Office

## 2011-06-07 NOTE — Progress Notes (Signed)
Physical Therapy Treatment Patient Details Name: Dawn Collins MRN: [id number] DOB: 09/01/1945 Today's Date: 06/07/2011 1308-6578 1Gt,1Te  PT Assessment/Plan  PT - Assessment/Plan Comments on Treatment Session: Patient with much improved activity tolerance with ambulation today.  She only had tylenol just prior to ambulation so held off on exercises.  But no complaints of nausea and still fatigued, but improved strength.  Will benefit from one more session for review of HEP and precautions and car transfers. PT Plan: Discharge plan remains appropriate PT Frequency: 7X/week Follow Up Recommendations: Home health PT PT Goals  Acute Rehab PT Goals PT Goal: Supine/Side to Sit - Progress: Met PT Goal: Sit to Supine/Side - Progress: Met PT Transfer Goal: Sit to Stand/Stand to Sit - Progress: Progressing toward goal PT Goal: Ambulate - Progress: Progressing toward goal PT Goal: Perform Home Exercise Program - Progress: Progressing toward goal  PT Treatment Precautions/Restrictions  Precautions Precautions: Knee Precaution Comments: no pillow under knee Required Braces or Orthoses: Yes Knee Immobilizer: On except when in CPM;On when out of bed or walking;Discontinue once straight leg raise with < 10 degree lag Restrictions Weight Bearing Restrictions: No RLE Weight Bearing: Weight bearing as tolerated Mobility (including Balance) Bed Mobility Supine to Sit: 5: Supervision Supine to Sit Details (indicate cue type and reason): cue for crossed leg technique Sit to Supine - Left: 4: Min assist Sit to Supine - Left Details (indicate cue type and reason): cue for crossed leg technique assist to keep leg on other leg Transfers Sit to Stand: 4: Min assist Stand to Sit: 4: Min assist Stand to Sit Details: cue for leg out Ambulation/Gait Ambulation/Gait Assistance: 4: Min assist Ambulation Distance (Feet): 80 Feet Assistive device: Rolling walker    Exercise  Total Joint  Exercises Heel Slides: AAROM;Right;5 reps;Seated (with foot on floor) End of Session PT - End of Session Equipment Utilized During Treatment: Gait belt Activity Tolerance: Patient limited by fatigue Patient left: in bed General Behavior During Session: [facility] for tasks performed Cognition: [facility] for tasks performed  [facility] 06/07/2011, 12:40 PM

## 2011-06-07 NOTE — Discharge Summary (Signed)
Physician Discharge Summary   Patient ID: Dawn Collins MRN: [id number] DOB/AGE: 67-Jun-1947 67 y.o.  Admit date: 06/04/2011 Discharge date: 06/07/2011  Primary Diagnosis: Osteoarthrosis Right Knee   Admission Diagnoses: Past Medical History  Diagnosis Date  . Hypertension   . Asthma     2 weeks cold,no problems now-well controlled  . Sleep apnea     No cpap now -3 months last due to insurance reasons  . GERD (gastroesophageal reflux disease)     tx. pantoprazole  . Diverticulitis of colon 05-28-11    no current problems  . Arthritis     knees, back.  . Bursitis, knee     Bil. Hips, not knee    Discharge Diagnoses:  Principal Problem:  *Osteoarthritis of right knee Postop Hypokalemia Postop Hyponatremia   Procedure: Procedure(s) (LRB): TOTAL KNEE ARTHROPLASTY (Right)   Consults: none  HPI: [name] is a 65 y.o. year old female with end stage OA of her right knee with progressively worsening pain and dysfunction. She has constant pain, with activity and at rest and significant functional deficits with difficulties even with ADLs. She has had extensive non-op management including analgesics, injections of cortisone and viscosupplements, and home exercise program, but remains in significant pain with significant dysfunction. She has bone on bone arthritis of her lateral and patellofemoral compartments with a large valgus deformity and large osteophytes.She presents now for left Total Knee Arthroplasty.    Laboratory Data: Results for orders placed during the hospital encounter of 06/04/11  TYPE AND SCREEN      Component Value Range   ABO/RH(D) AB POS     Antibody Screen NEG     Sample Expiration 06/07/2011    ABO/RH      Component Value Range   ABO/RH(D) AB POS    CBC      Component Value Range   WBC 10.9 (*) 4.0 - 10.5 (K/uL)   RBC 3.68 (*) 3.87 - 5.11 (MIL/uL)   Hemoglobin 11.0 (*) 12.0 - 15.0 (g/dL)   HCT 78.2 (*) 95.6 - 46.0 (%)   MCV 87.0  78.0  - 100.0 (fL)   MCH 29.9  26.0 - 34.0 (pg)   MCHC 34.4  30.0 - 36.0 (g/dL)   RDW 21.3  08.6 - 57.8 (%)   Platelets 267  150 - 400 (K/uL)  BASIC METABOLIC PANEL      Component Value Range   Sodium 134 (*) 135 - 145 (mEq/L)   Potassium 4.0  3.5 - 5.1 (mEq/L)   Chloride 99  96 - 112 (mEq/L)   CO2 27  19 - 32 (mEq/L)   Glucose, Bld 169 (*) 70 - 99 (mg/dL)   BUN 7  6 - 23 (mg/dL)   Creatinine, Ser 4.69  0.50 - 1.10 (mg/dL)   Calcium 8.9  8.4 - 62.9 (mg/dL)   GFR calc non Af Amer >90  >90 (mL/min)   GFR calc Af Amer >90  >90 (mL/min)  CBC      Component Value Range   WBC 10.8 (*) 4.0 - 10.5 (K/uL)   RBC 3.49 (*) 3.87 - 5.11 (MIL/uL)   Hemoglobin 10.5 (*) 12.0 - 15.0 (g/dL)   HCT 52.8 (*) 41.3 - 46.0 (%)   MCV 88.3  78.0 - 100.0 (fL)   MCH 30.1  26.0 - 34.0 (pg)   MCHC 34.1  30.0 - 36.0 (g/dL)   RDW 24.4  01.0 - 27.2 (%)   Platelets 264  150 - 400 (K/uL)  BASIC  METABOLIC PANEL      Component Value Range   Sodium 135  135 - 145 (mEq/L)   Potassium 3.3 (*) 3.5 - 5.1 (mEq/L)   Chloride 98  96 - 112 (mEq/L)   CO2 29  19 - 32 (mEq/L)   Glucose, Bld 113 (*) 70 - 99 (mg/dL)   BUN 10  6 - 23 (mg/dL)   Creatinine, Ser 0.45  0.50 - 1.10 (mg/dL)   Calcium 8.9  8.4 - 40.9 (mg/dL)   GFR calc non Af Amer 88 (*) >90 (mL/min)   GFR calc Af Amer >90  >90 (mL/min)  CBC      Component Value Range   WBC 9.0  4.0 - 10.5 (K/uL)   RBC 3.47 (*) 3.87 - 5.11 (MIL/uL)   Hemoglobin 10.4 (*) 12.0 - 15.0 (g/dL)   HCT 81.1 (*) 91.4 - 46.0 (%)   MCV 88.8  78.0 - 100.0 (fL)   MCH 30.0  26.0 - 34.0 (pg)   MCHC 33.8  30.0 - 36.0 (g/dL)   RDW 78.2  95.6 - 21.3 (%)   Platelets 260  150 - 400 (K/uL)    Office Visit on 05/28/2011  Component Date Value Range Status  . aPTT (seconds) 05/28/2011 31  24-37 Final  . Sodium (mEq/L) 05/28/2011 135  135-145 Final  . Potassium (mEq/L) 05/28/2011 3.5  3.5-5.1 Final  . Chloride (mEq/L) 05/28/2011 97  96-112 Final  . CO2 (mEq/L) 05/28/2011 30  19-32 Final  .  Glucose, Bld (mg/dL) 08/65/7846 87  96-29 Final  . BUN (mg/dL) 52/84/1324 13  4-01 Final  . Creatinine, Ser (mg/dL) 02/72/5366 4.40  3.47-4.25 Final  . Calcium (mg/dL) 95/63/8756 43.3  2.9-51.8 Final  . Total Protein (g/dL) 84/16/6063 7.8  0.1-6.0 Final  . Albumin (g/dL) 10/93/2355 4.2  7.3-2.2 Final  . AST (U/L) 05/28/2011 19  0-37 Final  . ALT (U/L) 05/28/2011 17  0-35 Final  . Alkaline Phosphatase (U/L) 05/28/2011 66  39-117 Final  . Total Bilirubin (mg/dL) 02/54/2706 0.5  2.3-7.6 Final  . GFR calc non Af Amer (mL/min) 05/28/2011 63* >90 Final  . GFR calc Af Amer (mL/min) 05/28/2011 73* >90 Final   Comment:                                 The eGFR has been calculated                          using the CKD EPI equation.                          This calculation has not been                          validated in all clinical                          situations.                          eGFR's persistently                          <90 mL/min signify  possible Chronic Kidney Disease.  Marland Kitchen Prothrombin Time (seconds) 05/28/2011 13.8  11.6-15.2 Final  . INR  05/28/2011 1.04  0.00-1.49 Final  . MRSA, PCR  05/28/2011 NEGATIVE  NEGATIVE Final  . Staphylococcus aureus  05/28/2011 NEGATIVE  NEGATIVE Final   Comment:                                 The Xpert SA Assay (FDA                          approved for NASAL specimens                          only), is one component of                          a comprehensive surveillance                          program.  It is not intended                          to diagnose infection nor to                          guide or monitor treatment.  . Color, Urine  05/28/2011 YELLOW  YELLOW Final  . Appearance  05/28/2011 CLEAR  CLEAR Final  . Specific Gravity, Urine  05/28/2011 1.013  1.005-1.030 Final  . pH  05/28/2011 8.0  5.0-8.0 Final  . Glucose, UA (mg/dL) 62/95/2841 NEGATIVE  NEGATIVE Final  . Hgb urine dipstick   05/28/2011 NEGATIVE  NEGATIVE Final  . Bilirubin Urine  05/28/2011 NEGATIVE  NEGATIVE Final  . Ketones, ur (mg/dL) 32/44/0102 NEGATIVE  NEGATIVE Final  . Protein, ur (mg/dL) 72/53/6644 NEGATIVE  NEGATIVE Final  . Urobilinogen, UA (mg/dL) 03/47/4259 0.2  5.6-3.8 Final  . Nitrite  05/28/2011 NEGATIVE  NEGATIVE Final  . Leukocytes, UA  05/28/2011 NEGATIVE  NEGATIVE Final   MICROSCOPIC NOT DONE ON URINES WITH NEGATIVE PROTEIN, BLOOD, LEUKOCYTES, NITRITE, OR GLUCOSE <1000 mg/dL.    X-Rays:Dg Chest 2 View  05/27/2011  *RADIOLOGY REPORT*  Clinical Data: Chest pain.  CHEST - 2 VIEW  Comparison: 11/12/2009  Findings: Heart and mediastinal contours are within normal limits. No focal opacities or effusions.  No acute bony abnormality. Degenerative changes in the thoracic spine.  IMPRESSION: No active cardiopulmonary disease.  Original Report Authenticated By: Cyndie Chime, M.D.    EKG: Orders placed during the hospital encounter of 05/27/11  . ED EKG  . ED EKG  . EKG     Hospital Course: Patient was admitted to [facility] and taken to the OR and underwent the above state procedure without complications.  Patient tolerated the procedure well and was later transferred to the recovery room and then to the orthopaedic floor for postoperative care.  They were given PO and IV analgesics for pain control following their surgery.  They were given 24 hours of postoperative antibiotics and started on DVT prophylaxis.   PT and OT were ordered for total joint protocol.  Discharge planning consulted to help with postop disposition and equipment needs.  Patient had a decent night on the evening of surgery except for some  intermittent nausea but started to get up with therapy on day one.  PCA was discontinued and they were weaned over to PO meds.  Hemovac drain was pulled without difficulty.  Continued to progress with therapy into day two walking over 30 feet.  Dressing was changed on day two and the  incision was healing well.  By day three, the patient had progressed with therapy and meeting goals.  Incision was healing well.  Patient was seen in rounds and was ready to go home.    Discharge Medications: Tramadol, Robaxin, Xarelto  Diet:low sodium  Activity:WBAT   Follow-up:in 2 weeks  Disposition: Home  Discharged Condition: good   Discharge Orders    Future Orders Please Complete By Expires   Diet - low sodium heart healthy      Call MD / Call 911      Comments:   If you experience chest pain or shortness of breath, CALL 911 and be transported to the hospital emergency room.  If you develope a fever above 101 F, pus (white drainage) or increased drainage or redness at the wound, or calf pain, call your surgeon's office.   Constipation Prevention      Comments:   Drink plenty of fluids.  Prune juice may be helpful.  You may use a stool softener, such as Colace (over the counter) 100 mg twice a day.  Use MiraLax (over the counter) for constipation as needed.   Increase activity slowly as tolerated      Weight Bearing as taught in Physical Therapy      Comments:   Use a walker or crutches as instructed.   Discharge instructions      Comments:   Pick up stool softner and laxative for home. Do not submerge incision under water. May shower.    Driving restrictions      Comments:   No driving   Lifting restrictions      Comments:   No lifting     Current Discharge Medication List    START taking these medications   Details  methocarbamol (ROBAXIN) 500 MG tablet Take 1 tablet (500 mg total) by mouth every 6 (six) hours as needed. Qty: 80 tablet, Refills: 0    rivaroxaban (XARELTO) 10 MG TABS tablet Take 1 tablet (10 mg total) by mouth daily. Qty: 18 tablet, Refills: 0    traMADol (ULTRAM) 50 MG tablet Take 1-2 tablets (50-100 mg total) by mouth every 6 (six) hours as needed for pain. Maximum dose= 8 tablets per day Qty: 80 tablet, Refills: 0      CONTINUE  these medications which have NOT CHANGED   Details  acetaminophen (TYLENOL) 500 MG tablet Take 1,000 mg by mouth every 6 (six) hours as needed. For arthritis     albuterol (PROVENTIL HFA;VENTOLIN HFA) 108 (90 BASE) MCG/ACT inhaler Inhale 2 puffs into the lungs every 4 (four) hours as needed. Wheezing and shortness of breath     amiloride-hydrochlorothiazide (MODURETIC) 5-50 MG tablet Take 1 tablet by mouth daily.      diltiazem (CARDIZEM CD) 300 MG 24 hr capsule Take 300 mg by mouth daily.      montelukast (SINGULAIR) 10 MG tablet Take 10 mg by mouth at bedtime.      pantoprazole (PROTONIX) 40 MG tablet Take 40 mg by mouth daily.        STOP taking these medications     aspirin 81 MG tablet      estradiol (ESTRACE) 2 MG  tablet      meloxicam (MOBIC) 15 MG tablet        Follow-up Information    Follow up with ALUISIO,FRANK V. Make an appointment on 06/19/2011.   Contact information:   [facility] [address], [address] [place] [address] [phone]          Signed: Patrica Duel 06/07/2011, 11:01 AM

## 2011-06-07 NOTE — Progress Notes (Signed)
CM consult done. See CM notes in shadow chart.  Colleen Can [facility] RN BSN CCM [phone] 06/07/2011

## 2011-06-07 NOTE — Progress Notes (Signed)
Physical Therapy Treatment/Discharge Note Patient Details Name: Dawn Collins MRN: [id number] DOB: 12-25-1945 Today's Date: 06/07/2011 4132-4401 1Gt,1Te  PT Assessment/Plan  PT - Assessment/Plan Comments on Treatment Session: Patient educated on HEP, stair negotiation, car transfers and reviewed knee precautions.  Ready for D/C home with HHPT to follow. PT Plan: Discharge plan remains appropriate PT Frequency: Other (Comment) (D/C home today) Follow Up Recommendations: Home health PT Equipment Recommended:  (equipment already delivered in the room) PT Goals  Acute Rehab PT Goals PT Goal: Supine/Side to Sit - Progress: Met PT Goal: Sit to Supine/Side - Progress: Met PT Transfer Goal: Sit to Stand/Stand to Sit - Progress: Met PT Goal: Ambulate - Progress: Met PT Goal: Perform Home Exercise Program - Progress: Progressing toward goal  PT Treatment Precautions/Restrictions  Precautions Precautions: Knee Precaution Comments: no pillow under knee Required Braces or Orthoses: Yes Knee Immobilizer: On except when in CPM;On when out of bed or walking Restrictions Weight Bearing Restrictions: No RLE Weight Bearing: Weight bearing as tolerated Mobility (including Balance) Bed Mobility Supine to Sit: 5: Supervision Supine to Sit Details (indicate cue type and reason): for safety Sit to Supine - Left: 5: Supervision Sit to Supine - Left Details (indicate cue type and reason): for safety Transfers Sit to Stand: 5: Supervision Sit to Stand Details (indicate cue type and reason): cues for technique/hand placement Stand to Sit: 5: Supervision Stand to Sit Details: cue for leg out Ambulation/Gait Ambulation/Gait Assistance: 5: Supervision Ambulation Distance (Feet): 20 Feet Assistive device: Rolling walker Stairs: Yes Stairs Assistance: 4: Min assist Stairs Assistance Details (indicate cue type and reason): cue for technique/sequence Stair Management Technique: With  walker;Backwards Number of Stairs: 1     Exercise  Total Joint Exercises Ankle Circles/Pumps: AROM;Both;10 reps;Supine Quad Sets: AROM;Right;10 reps;Supine Towel Squeeze: AROM;Right;5 reps;Supine Short Arc Quad: AAROM;Right;Supine;10 reps Heel Slides: AAROM;Right;10 reps;Supine Hip ABduction/ADduction: AAROM;Right;10 reps;Supine Straight Leg Raises: AAROM;Right;10 reps;Supine End of Session PT - End of Session Equipment Utilized During Treatment: Gait belt Activity Tolerance: Patient tolerated treatment well Patient left: in bed;with call bell in reach;with family/visitor present Nurse Communication: Other (comment) (ready for D/C) General Behavior During Session: [facility] for tasks performed Cognition: [facility] for tasks performed  [facility] 06/07/2011, 3:25 PM

## 2011-06-24 ENCOUNTER — Encounter (HOSPITAL_BASED_OUTPATIENT_CLINIC_OR_DEPARTMENT_OTHER): Payer: Self-pay | Admitting: Emergency Medicine

## 2011-06-24 ENCOUNTER — Emergency Department (INDEPENDENT_AMBULATORY_CARE_PROVIDER_SITE_OTHER): Payer: Medicare Other

## 2011-06-24 ENCOUNTER — Emergency Department (HOSPITAL_BASED_OUTPATIENT_CLINIC_OR_DEPARTMENT_OTHER)
Admission: EM | Admit: 2011-06-24 | Discharge: 2011-06-24 | Disposition: A | Discharge status: Home / Self Care | Payer: Medicare Other | Attending: Emergency Medicine | Admitting: Emergency Medicine

## 2011-06-24 DIAGNOSIS — R079 Chest pain, unspecified: Secondary | ICD-10-CM

## 2011-06-24 DIAGNOSIS — R0602 Shortness of breath: Secondary | ICD-10-CM | POA: Insufficient documentation

## 2011-06-24 DIAGNOSIS — Z79899 Other long term (current) drug therapy: Secondary | ICD-10-CM | POA: Insufficient documentation

## 2011-06-24 DIAGNOSIS — I10 Essential (primary) hypertension: Secondary | ICD-10-CM | POA: Insufficient documentation

## 2011-06-24 DIAGNOSIS — R799 Abnormal finding of blood chemistry, unspecified: Secondary | ICD-10-CM

## 2011-06-24 DIAGNOSIS — G4730 Sleep apnea, unspecified: Secondary | ICD-10-CM | POA: Insufficient documentation

## 2011-06-24 DIAGNOSIS — J45909 Unspecified asthma, uncomplicated: Secondary | ICD-10-CM | POA: Insufficient documentation

## 2011-06-24 DIAGNOSIS — K219 Gastro-esophageal reflux disease without esophagitis: Secondary | ICD-10-CM | POA: Insufficient documentation

## 2011-06-24 LAB — BASIC METABOLIC PANEL
BUN: 10 mg/dL (ref 6–23)
Calcium: 9.8 mg/dL (ref 8.4–10.5)
GFR calc Af Amer: >90 mL/min (ref >90)
GFR calc non Af Amer: >90 mL/min (ref >90)
Potassium: 3.5 mEq/L (ref 3.5–5.1)

## 2011-06-24 LAB — CBC
MCH: 29 pg (ref 26.0–34.0)
MCHC: 33.4 g/dL (ref 30.0–36.0)
Platelets: 407 10*3/uL — ABNORMAL HIGH (ref 150–400)

## 2011-06-24 LAB — DIFFERENTIAL
Basophils Relative: 1 % (ref 0–1)
Eosinophils Absolute: 0.2 10*3/uL (ref 0.0–0.7)
Monocytes Relative: 8 % (ref 3–12)
Neutrophils Relative %: 64 % (ref 43–77)

## 2011-06-24 LAB — TROPONIN I: Troponin I: <0.3 ng/mL (ref <0.30)

## 2011-06-24 LAB — PROTIME-INR: INR: 1.05 (ref 0.00–1.49)

## 2011-06-24 LAB — D-DIMER, QUANTITATIVE: D-Dimer, Quant: 3.53 ug/mL-FEU — ABNORMAL HIGH (ref 0.00–0.48)

## 2011-06-24 LAB — PRO B NATRIURETIC PEPTIDE: Pro B Natriuretic peptide (BNP): 119.9 pg/mL (ref 0–125)

## 2011-06-24 LAB — CARDIAC PANEL(CRET KIN+CKTOT+MB+TROPI): Relative Index: INVALID (ref 0.0–2.5)

## 2011-06-24 MED ORDER — ASPIRIN 81 MG PO CHEW
243.0000 mg | CHEWABLE_TABLET | Freq: Once | ORAL | Status: AC
  Administered 2011-06-24: 243 mg via ORAL
  Filled 2011-06-24: qty 3

## 2011-06-24 MED ORDER — IOHEXOL 350 MG/ML SOLN
100.0000 mL | Freq: Once | INTRAVENOUS | Status: AC | PRN
  Administered 2011-06-24: 100 mL via INTRAVENOUS

## 2011-06-24 NOTE — ED Notes (Signed)
Denies any pain.

## 2011-06-24 NOTE — ED Provider Notes (Signed)
Second troponin returned negative and patient was discharged in good condition.  Cyndra Numbers, MD 06/24/11 [phone]

## 2011-06-24 NOTE — ED Notes (Signed)
Care reviewed with pt and family verablize plan well

## 2011-06-24 NOTE — ED Provider Notes (Signed)
History     CSN: [id number] Arrival date & time: 06/24/2011 11:37 AM   First MD Initiated Contact with Patient 06/24/11 1234      Chief Complaint  Patient presents with  . Chest Pain    chest pressure 5/10 today took asa 81mg  at home this am denies any cardiac Hx    (Consider location/radiation/quality/duration/timing/severity/associated sxs/prior treatment) HPI Comments: Intermittent substernal and left-sided chest pressure. States has been present for about 2-3 weeks after having a knee replacement. Pain has not subsided. There no specific exacerbating or alleviating measures. Had a heart catheterization which was normal at [facility] 2011  Patient is a 65 y.o. female presenting with chest pain. The history is provided by the patient. No language interpreter was used.  Chest Pain The chest pain began more than 2 weeks ago (2-3 weeks ago). Chest pain occurs intermittently. The chest pain is unchanged. The pain is currently at 0/10. The severity of the pain is mild. The quality of the pain is described as pressure-like. The pain does not radiate. Exacerbated by: nothing. Primary symptoms include shortness of breath and palpitations. Pertinent negatives for primary symptoms include no fever, no fatigue, no cough, no wheezing, no abdominal pain, no nausea, no vomiting and no dizziness.  The palpitations also occurred with shortness of breath. The palpitations did not occur with dizziness.  Pertinent negatives for associated symptoms include no numbness and no weakness.     Past Medical History  Diagnosis Date  . Hypertension   . Asthma     2 weeks cold,no problems now-well controlled  . Sleep apnea     No cpap now -3 months last due to insurance reasons  . GERD (gastroesophageal reflux disease)     tx. pantoprazole  . Diverticulitis of colon 05-28-11    no current problems  . Arthritis     knees, back.  . Bursitis, knee     Bil. Hips, not knee    Past Surgical History    Procedure Date  . Abdominal hysterectomy   . Cervical disc surgery     1977  . Breast biopsy     several- all benign  . Cardiac catheterization     10'11  . Knee arthroscopy     right '04  . Foot surgery     Bilateral  . 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 No pertinent family history.  History  Substance Use Topics  . Smoking status: Never Smoker   . Smokeless tobacco: Not on file  . Alcohol Use: No    OB History    Grav Para Term Preterm Abortions TAB SAB Ect Mult Living                  Review of Systems  Constitutional: Negative for fever, activity change, appetite change and fatigue.  HENT: Negative for congestion, sore throat, rhinorrhea, neck pain and neck stiffness.   Respiratory: Positive for shortness of breath. Negative for cough, chest tightness and wheezing.   Cardiovascular: Positive for chest pain and palpitations.  Gastrointestinal: Negative for nausea, vomiting and abdominal pain.  Genitourinary: Negative for dysuria, urgency, frequency and flank pain.  Neurological: Negative for dizziness, weakness, light-headedness, numbness and headaches.  All other systems reviewed and are negative.    Allergies  Morphine and related; Avelox; Biaxin; Celebrex; Codeine; Food; Hycodan; Pentazocine;  Septra; Stadol; Zoloft; Duraphen; Floxin; and Penicillins  Home Medications   Current Outpatient Rx  Name Route Sig Dispense Refill  . ACETAMINOPHEN 500 MG PO TABS Oral Take 1,000 mg by mouth every 6 (six) hours as needed. For arthritis     . ALBUTEROL SULFATE HFA 108 (90 BASE) MCG/ACT IN AERS Inhalation Inhale 2 puffs into the lungs every 4 (four) hours as needed. Wheezing and shortness of breath     . AMILORIDE-HYDROCHLOROTHIAZIDE 5-50 MG PO TABS Oral Take 1 tablet by mouth daily.      Marland Kitchen DILTIAZEM HCL ER COATED BEADS  300 MG PO CP24 Oral Take 300 mg by mouth daily.      Marland Kitchen MONTELUKAST SODIUM 10 MG PO TABS Oral Take 10 mg by mouth at bedtime.      Marland Kitchen PANTOPRAZOLE SODIUM 40 MG PO TBEC Oral Take 40 mg by mouth daily.      Marland Kitchen RIVAROXABAN 10 MG PO TABS Oral Take 1 tablet (10 mg total) by mouth daily. 18 tablet 0    BP 138/64  Pulse 83  Temp(Src) 98 F (36.7 C) (Oral)  Resp 18  SpO2 100%  Physical Exam  Nursing note and vitals reviewed. Constitutional: She is oriented to person, place, and time. She appears well-developed and well-nourished. No distress.  HENT:  Head: Normocephalic and atraumatic.  Mouth/Throat: Oropharynx is clear and moist.  Eyes: Conjunctivae and EOM are normal. Pupils are equal, round, and reactive to light.  Neck: Normal range of motion. Neck supple.  Cardiovascular: Normal rate, regular rhythm, normal heart sounds and intact distal pulses.  Exam reveals no gallop and no friction rub.   No murmur heard. Pulmonary/Chest: Effort normal and breath sounds normal. No respiratory distress. She exhibits no tenderness.  Abdominal: Soft. Bowel sounds are normal. There is no tenderness.  Musculoskeletal: Normal range of motion. She exhibits no tenderness.  Neurological: She is alert and oriented to person, place, and time. No cranial nerve deficit.  Skin: Skin is warm and dry.    ED Course  Procedures (including critical care time)   Date: 06/24/2011  Rate: 76  Rhythm: normal sinus rhythm  QRS Axis: normal  Intervals: normal  ST/T Wave abnormalities: normal  Conduction Disutrbances:none  Narrative Interpretation:   Old EKG Reviewed: unchanged  Labs Reviewed  CBC - Abnormal; Notable for the following:    Hemoglobin 11.5 (*)    HCT 34.4 (*)    Platelets 407 (*)    All other components within normal limits  D-DIMER, QUANTITATIVE - Abnormal; Notable for the following:    D-Dimer, Quant 3.53 (*)    All other components within normal limits  DIFFERENTIAL  BASIC METABOLIC PANEL   APTT  PROTIME-INR  CARDIAC PANEL(CRET KIN+CKTOT+MB+TROPI)  PRO B NATRIURETIC PEPTIDE  URINALYSIS, ROUTINE W REFLEX MICROSCOPIC  TROPONIN I   Dg Chest 2 View  06/24/2011  *RADIOLOGY REPORT*  Clinical Data: Chest pain.  CHEST - 2 VIEW  Comparison: 05/27/2011  Findings: Heart and mediastinal contours are within normal limits. No focal opacities or effusions.  No acute bony abnormality.  IMPRESSION: No active cardiopulmonary disease.  Original Report Authenticated By: Cyndie Chime, M.D.   Ct Angio Chest W/cm &/or Wo Cm  06/24/2011  *RADIOLOGY REPORT*  Clinical Data:  Chest pain.  Elevated D-dimer.  CT ANGIOGRAPHY CHEST WITH CONTRAST  Technique:  Multidetector CT imaging of the chest was performed using the standard protocol during bolus administration of intravenous contrast.  Multiplanar CT image reconstructions including MIPs  were obtained to evaluate the vascular anatomy.  Contrast: OMNIPAQUE IOHEXOL 350 MG/ML IV SOLN  Comparison:  Chest x-ray today.  Findings:  Lungs are clear.  No focal airspace opacities or suspicious nodules.  No effusions. Heart is normal size. Aorta is normal caliber.  Scattered coronary artery calcifications. No mediastinal, hilar, or axillary adenopathy.  Visualized thyroid and chest wall soft tissues unremarkable.  No filling defects in the pulmonary arteries to suggest pulmonary emboli.  Imaging into the upper abdomen shows no acute findings. Degenerative changes throughout the thoracic spine.  Review of the MIP images confirms the above findings.  IMPRESSION: No evidence of pulmonary embolus.  Scattered coronary artery calcifications.  Original Report Authenticated By: Cyndie Chime, M.D.   I obtained and reviewed the heart catheterization report performed on the patient on 05/08/2010. There is a normal cardiac catheterization with no coronary artery disease, normal LV function.  She had an EF of 70%  1. Chest pain, unspecified       MDM  Cardiac workup as  well as to rule out PE was performed in the emergency department. D-dimer is found to be elevated about 3.5 therefore a CT PE was obtained which was negative. Her first set of cardiac markers was unremarkable. She'll receive a delta troponin at 3 hours if this is unchanged she'll be discharged home with instructions to followup with her primary care physician in one week to discuss the need for stress test. She had completely normal heart catheterization 1 year ago.  The patient will be signed out to my colleague dr hunt with whom the plan was discussed. Patient was evaluated and determined to have no life threats the cause of her symptoms. Not concerned about ACS as a delta troponin is negative. If there is any change she'll require admission.        Dayton Bailiff, MD 06/24/11 [phone]

## 2011-06-24 NOTE — ED Notes (Signed)
Pt reports Hx of Knee replacement to R side two weeks agot with onset of Chest pressure today with "some Sob" arrived no acute distress

## 2011-06-26 ENCOUNTER — Ambulatory Visit: Payer: Medicare Other | Attending: Orthopedic Surgery | Admitting: Physical Therapy

## 2011-06-26 DIAGNOSIS — M25569 Pain in unspecified knee: Secondary | ICD-10-CM | POA: Insufficient documentation

## 2011-06-26 DIAGNOSIS — IMO0001 Reserved for inherently not codable concepts without codable children: Secondary | ICD-10-CM | POA: Insufficient documentation

## 2011-06-26 DIAGNOSIS — Z96659 Presence of unspecified artificial knee joint: Secondary | ICD-10-CM | POA: Insufficient documentation

## 2011-06-26 DIAGNOSIS — M25669 Stiffness of unspecified knee, not elsewhere classified: Secondary | ICD-10-CM | POA: Insufficient documentation

## 2011-06-29 ENCOUNTER — Ambulatory Visit: Payer: Medicare Other | Admitting: Physical Therapy

## 2011-07-02 ENCOUNTER — Ambulatory Visit: Payer: Medicare Other | Admitting: Physical Therapy

## 2011-07-04 ENCOUNTER — Ambulatory Visit: Payer: Medicare Other | Admitting: Physical Therapy

## 2011-07-06 ENCOUNTER — Ambulatory Visit: Payer: Medicare Other | Admitting: Physical Therapy

## 2011-07-09 ENCOUNTER — Ambulatory Visit: Payer: Medicare Other | Admitting: Physical Therapy

## 2011-07-11 ENCOUNTER — Ambulatory Visit: Payer: Medicare Other | Admitting: Physical Therapy

## 2011-07-13 ENCOUNTER — Ambulatory Visit: Payer: Medicare Other | Admitting: Physical Therapy

## 2011-07-18 ENCOUNTER — Ambulatory Visit: Payer: Medicare Other | Admitting: Physical Therapy

## 2011-07-20 ENCOUNTER — Ambulatory Visit: Payer: Medicare Other | Admitting: Physical Therapy

## 2011-07-25 ENCOUNTER — Ambulatory Visit: Payer: Medicare Other | Attending: Orthopedic Surgery | Admitting: Physical Therapy

## 2011-07-25 DIAGNOSIS — Z96659 Presence of unspecified artificial knee joint: Secondary | ICD-10-CM | POA: Insufficient documentation

## 2011-07-25 DIAGNOSIS — M25669 Stiffness of unspecified knee, not elsewhere classified: Secondary | ICD-10-CM | POA: Insufficient documentation

## 2011-07-25 DIAGNOSIS — M25569 Pain in unspecified knee: Secondary | ICD-10-CM | POA: Insufficient documentation

## 2011-07-25 DIAGNOSIS — IMO0001 Reserved for inherently not codable concepts without codable children: Secondary | ICD-10-CM | POA: Insufficient documentation

## 2011-07-27 ENCOUNTER — Ambulatory Visit: Payer: Medicare Other | Admitting: Physical Therapy

## 2011-07-30 ENCOUNTER — Ambulatory Visit: Payer: Medicare Other | Admitting: Physical Therapy

## 2011-08-01 ENCOUNTER — Ambulatory Visit: Payer: Medicare Other | Admitting: Physical Therapy

## 2011-08-03 ENCOUNTER — Ambulatory Visit: Payer: Medicare Other | Admitting: Physical Therapy

## 2011-08-06 ENCOUNTER — Ambulatory Visit: Payer: Medicare Other | Admitting: Physical Therapy

## 2011-08-08 ENCOUNTER — Ambulatory Visit: Payer: Medicare Other | Admitting: Physical Therapy

## 2011-08-10 ENCOUNTER — Ambulatory Visit: Payer: Medicare Other | Admitting: Physical Therapy

## 2011-08-13 ENCOUNTER — Ambulatory Visit: Payer: Medicare Other | Admitting: Physical Therapy

## 2011-08-20 ENCOUNTER — Ambulatory Visit: Payer: Medicare Other | Admitting: Physical Therapy

## 2011-08-22 ENCOUNTER — Ambulatory Visit: Payer: Medicare Other | Admitting: Physical Therapy

## 2011-08-27 ENCOUNTER — Ambulatory Visit: Payer: Medicare Other | Admitting: Physical Therapy

## 2011-08-28 ENCOUNTER — Ambulatory Visit: Payer: Medicare Other | Attending: Orthopedic Surgery | Admitting: Physical Therapy

## 2011-08-28 DIAGNOSIS — IMO0001 Reserved for inherently not codable concepts without codable children: Secondary | ICD-10-CM | POA: Insufficient documentation

## 2011-08-28 DIAGNOSIS — Z96659 Presence of unspecified artificial knee joint: Secondary | ICD-10-CM | POA: Insufficient documentation

## 2011-08-28 DIAGNOSIS — M25569 Pain in unspecified knee: Secondary | ICD-10-CM | POA: Insufficient documentation

## 2011-08-28 DIAGNOSIS — M25669 Stiffness of unspecified knee, not elsewhere classified: Secondary | ICD-10-CM | POA: Insufficient documentation

## 2011-09-03 ENCOUNTER — Ambulatory Visit: Payer: Medicare Other | Admitting: Physical Therapy

## 2011-09-04 ENCOUNTER — Ambulatory Visit: Payer: Medicare Other | Admitting: Physical Therapy

## 2011-09-10 ENCOUNTER — Ambulatory Visit: Payer: Medicare Other | Admitting: Physical Therapy

## 2011-09-11 ENCOUNTER — Ambulatory Visit: Payer: Medicare Other | Admitting: Physical Therapy

## 2011-09-17 ENCOUNTER — Ambulatory Visit: Payer: Medicare Other | Admitting: Physical Therapy

## 2011-09-18 ENCOUNTER — Ambulatory Visit: Payer: Medicare Other | Admitting: Physical Therapy

## 2011-10-01 ENCOUNTER — Ambulatory Visit: Payer: Medicare Other | Attending: Orthopedic Surgery | Admitting: Physical Therapy

## 2011-10-01 DIAGNOSIS — IMO0001 Reserved for inherently not codable concepts without codable children: Secondary | ICD-10-CM | POA: Insufficient documentation

## 2011-10-01 DIAGNOSIS — M25569 Pain in unspecified knee: Secondary | ICD-10-CM | POA: Insufficient documentation

## 2011-10-01 DIAGNOSIS — Z96659 Presence of unspecified artificial knee joint: Secondary | ICD-10-CM | POA: Insufficient documentation

## 2011-10-01 DIAGNOSIS — M25669 Stiffness of unspecified knee, not elsewhere classified: Secondary | ICD-10-CM | POA: Insufficient documentation

## 2011-10-02 ENCOUNTER — Ambulatory Visit: Payer: Medicare Other | Admitting: Physical Therapy

## 2011-10-08 ENCOUNTER — Ambulatory Visit: Payer: Medicare Other | Admitting: Physical Therapy

## 2011-10-09 ENCOUNTER — Ambulatory Visit: Payer: Medicare Other | Admitting: Physical Therapy

## 2011-10-15 ENCOUNTER — Ambulatory Visit: Payer: Medicare Other | Admitting: Physical Therapy

## 2011-10-16 ENCOUNTER — Ambulatory Visit: Payer: Medicare Other | Admitting: Physical Therapy

## 2011-10-22 ENCOUNTER — Ambulatory Visit: Payer: Medicare Other | Admitting: Physical Therapy

## 2011-10-23 ENCOUNTER — Ambulatory Visit: Payer: Medicare Other | Admitting: Physical Therapy

## 2011-10-30 ENCOUNTER — Ambulatory Visit: Payer: Medicare Other | Attending: Orthopedic Surgery | Admitting: Physical Therapy

## 2011-10-30 DIAGNOSIS — M25569 Pain in unspecified knee: Secondary | ICD-10-CM | POA: Insufficient documentation

## 2011-10-30 DIAGNOSIS — M25669 Stiffness of unspecified knee, not elsewhere classified: Secondary | ICD-10-CM | POA: Insufficient documentation

## 2011-10-30 DIAGNOSIS — IMO0001 Reserved for inherently not codable concepts without codable children: Secondary | ICD-10-CM | POA: Insufficient documentation

## 2011-10-30 DIAGNOSIS — Z96659 Presence of unspecified artificial knee joint: Secondary | ICD-10-CM | POA: Insufficient documentation

## 2011-12-24 ENCOUNTER — Ambulatory Visit: Payer: Medicare Other | Attending: Orthopedic Surgery | Admitting: Physical Therapy

## 2011-12-24 DIAGNOSIS — M25669 Stiffness of unspecified knee, not elsewhere classified: Secondary | ICD-10-CM | POA: Insufficient documentation

## 2011-12-24 DIAGNOSIS — M25569 Pain in unspecified knee: Secondary | ICD-10-CM | POA: Insufficient documentation

## 2011-12-24 DIAGNOSIS — Z96659 Presence of unspecified artificial knee joint: Secondary | ICD-10-CM | POA: Insufficient documentation

## 2011-12-24 DIAGNOSIS — IMO0001 Reserved for inherently not codable concepts without codable children: Secondary | ICD-10-CM | POA: Insufficient documentation

## 2011-12-31 ENCOUNTER — Ambulatory Visit: Payer: Medicare Other | Admitting: Physical Therapy

## 2012-01-01 ENCOUNTER — Ambulatory Visit: Payer: Medicare Other | Admitting: Physical Therapy

## 2012-01-07 ENCOUNTER — Ambulatory Visit: Payer: Medicare Other | Admitting: Physical Therapy

## 2012-01-14 ENCOUNTER — Ambulatory Visit: Payer: Medicare Other | Admitting: Physical Therapy

## 2012-01-15 ENCOUNTER — Ambulatory Visit: Payer: Medicare Other | Admitting: Physical Therapy

## 2012-01-21 ENCOUNTER — Ambulatory Visit: Payer: Medicare Other | Admitting: Physical Therapy

## 2012-01-22 ENCOUNTER — Ambulatory Visit: Payer: Medicare Other | Attending: Orthopedic Surgery | Admitting: Physical Therapy

## 2012-01-22 DIAGNOSIS — M25569 Pain in unspecified knee: Secondary | ICD-10-CM | POA: Insufficient documentation

## 2012-01-22 DIAGNOSIS — Z96659 Presence of unspecified artificial knee joint: Secondary | ICD-10-CM | POA: Insufficient documentation

## 2012-01-22 DIAGNOSIS — IMO0001 Reserved for inherently not codable concepts without codable children: Secondary | ICD-10-CM | POA: Insufficient documentation

## 2012-01-22 DIAGNOSIS — M25669 Stiffness of unspecified knee, not elsewhere classified: Secondary | ICD-10-CM | POA: Insufficient documentation

## 2012-01-29 ENCOUNTER — Ambulatory Visit: Payer: Medicare Other | Admitting: Physical Therapy

## 2012-02-12 ENCOUNTER — Ambulatory Visit: Payer: Medicare Other | Admitting: Physical Therapy

## 2012-11-30 ENCOUNTER — Encounter (HOSPITAL_BASED_OUTPATIENT_CLINIC_OR_DEPARTMENT_OTHER): Payer: Self-pay | Admitting: *Deleted

## 2012-11-30 ENCOUNTER — Emergency Department (HOSPITAL_BASED_OUTPATIENT_CLINIC_OR_DEPARTMENT_OTHER)
Admission: EM | Admit: 2012-11-30 | Discharge: 2012-11-30 | Disposition: A | Discharge status: Home / Self Care | Payer: Medicare Other | Attending: Emergency Medicine | Admitting: Emergency Medicine

## 2012-11-30 DIAGNOSIS — Z880 Allergy status to penicillin: Secondary | ICD-10-CM | POA: Insufficient documentation

## 2012-11-30 DIAGNOSIS — H9209 Otalgia, unspecified ear: Secondary | ICD-10-CM | POA: Insufficient documentation

## 2012-11-30 DIAGNOSIS — J45909 Unspecified asthma, uncomplicated: Secondary | ICD-10-CM | POA: Insufficient documentation

## 2012-11-30 DIAGNOSIS — Z8669 Personal history of other diseases of the nervous system and sense organs: Secondary | ICD-10-CM | POA: Insufficient documentation

## 2012-11-30 DIAGNOSIS — Z79899 Other long term (current) drug therapy: Secondary | ICD-10-CM | POA: Insufficient documentation

## 2012-11-30 DIAGNOSIS — I10 Essential (primary) hypertension: Secondary | ICD-10-CM | POA: Insufficient documentation

## 2012-11-30 DIAGNOSIS — Z8739 Personal history of other diseases of the musculoskeletal system and connective tissue: Secondary | ICD-10-CM | POA: Insufficient documentation

## 2012-11-30 DIAGNOSIS — R51 Headache: Secondary | ICD-10-CM

## 2012-11-30 DIAGNOSIS — IMO0001 Reserved for inherently not codable concepts without codable children: Secondary | ICD-10-CM | POA: Insufficient documentation

## 2012-11-30 DIAGNOSIS — Z8719 Personal history of other diseases of the digestive system: Secondary | ICD-10-CM | POA: Insufficient documentation

## 2012-11-30 DIAGNOSIS — K219 Gastro-esophageal reflux disease without esophagitis: Secondary | ICD-10-CM | POA: Insufficient documentation

## 2012-11-30 MED ORDER — ACETAMINOPHEN 325 MG PO TABS
ORAL_TABLET | ORAL | Status: AC
  Filled 2012-11-30: qty 2

## 2012-11-30 MED ORDER — IBUPROFEN 400 MG PO TABS
ORAL_TABLET | ORAL | Status: AC
  Filled 2012-11-30: qty 1

## 2012-11-30 MED ORDER — ACETAMINOPHEN 325 MG PO TABS: 650.0000 mg | ORAL_TABLET | Freq: Once | ORAL | Status: DC

## 2012-11-30 MED ORDER — IBUPROFEN 200 MG PO TABS
ORAL_TABLET | ORAL | Status: AC
  Filled 2012-11-30: qty 1

## 2012-11-30 MED ORDER — IBUPROFEN 400 MG PO TABS: 400.0000 mg | ORAL_TABLET | Freq: Four times a day (QID) | ORAL | Status: DC | PRN

## 2012-11-30 MED ORDER — IBUPROFEN 400 MG PO TABS
600.0000 mg | ORAL_TABLET | Freq: Once | ORAL | Status: AC
  Administered 2012-11-30: 600 mg via ORAL

## 2012-11-30 NOTE — ED Notes (Signed)
Pt states that this started Wed. Saw Dr. On Friday. Dx'd with ear infection and prescribed drops which she has not gotten as of yet. Strep "neg" Continues to c/o pain behind right ear.

## 2012-11-30 NOTE — ED Provider Notes (Signed)
History    Scribed for Derwood Kaplan, MD, the patient was seen in room MHT13/MHT13. This chart was scribed by Lewanda Rife, ED scribe. Patient's care was started at 2234  CSN: [id number]  Arrival date & time 11/30/12  1924   First MD Initiated Contact with Patient 11/30/12 2152      Chief Complaint  Patient presents with  . Headache    (Consider location/radiation/quality/duration/timing/severity/associated sxs/prior treatment) The history is provided by the patient.  HPI Comments: Dawn Collins is a 66 y.o. female who presents to the Emergency Department complaining of waxing and waning worsening right post-auricular pain onset 5 days. Describes pain as stabbing. Reports right ear pain. Denies fever, chills, spinning sensation, and dizziness. Reports recently dx with ear infection and prescribed drops, which improved symptoms until today. Reports taking Zyrtec and Mobic with mild relief of symptoms.    Past Medical History  Diagnosis Date  . Hypertension   . Asthma     2 weeks cold,no problems now-well controlled  . Sleep apnea     No cpap now -3 months last due to insurance reasons  . GERD (gastroesophageal reflux disease)     tx. pantoprazole  . Diverticulitis of colon 05-28-11    no current problems  . Arthritis     knees, back.  . Bursitis, knee     Bil. Hips, not knee    Past Surgical History  Procedure Laterality Date  . Abdominal hysterectomy    . Cervical disc surgery      1977  . Breast biopsy      several- all benign  . Cardiac catheterization      10'11  . Knee arthroscopy      right '04  . Foot surgery      Bilateral  . Total knee arthroplasty  06/04/2011    Procedure: TOTAL KNEE ARTHROPLASTY;  Surgeon: Loanne Drilling;  Location: [facility];  Service: Orthopedics;  Laterality: Right;    History reviewed. No pertinent family history.  History  Substance Use Topics  . Smoking status: Never Smoker   . Smokeless tobacco: Not on file  . Alcohol  Use: No    OB History   Grav Para Term Preterm Abortions TAB SAB Ect Mult Living                  Review of Systems  Constitutional: Negative for fever.  HENT: Positive for ear pain.   Musculoskeletal: Positive for myalgias (right postauricular pain ).  All other systems reviewed and are negative.  A complete 10 system review of systems was obtained and all systems are negative except as noted in the HPI and PMH.     Allergies  Morphine and related; Avelox; Celebrex; Clarithromycin; Codeine; Food; Hycodan; Pentazocine; Septra; Sertraline hcl; Stadol; Duraphen; Floxin; and Penicillins  Home Medications   Current Outpatient Rx  Name  Route  Sig  Dispense  Refill  . acetaminophen (TYLENOL) 500 MG tablet   Oral   Take 1,000 mg by mouth every 6 (six) hours as needed. For arthritis          . albuterol (PROVENTIL HFA;VENTOLIN HFA) 108 (90 BASE) MCG/ACT inhaler   Inhalation   Inhale 2 puffs into the lungs every 4 (four) hours as needed. Wheezing and shortness of breath          . amiloride-hydrochlorothiazide (MODURETIC) 5-50 MG tablet   Oral   Take 1 tablet by mouth daily.           Marland Kitchen  diltiazem (CARDIZEM CD) 300 MG 24 hr capsule   Oral   Take 300 mg by mouth daily.           . montelukast (SINGULAIR) 10 MG tablet   Oral   Take 10 mg by mouth at bedtime.           . pantoprazole (PROTONIX) 40 MG tablet   Oral   Take 40 mg by mouth daily.           . rivaroxaban (XARELTO) 10 MG TABS tablet   Oral   Take 1 tablet (10 mg total) by mouth daily.   18 tablet   0     BP 157/85  Pulse 86  Temp(Src) 98.4 F (36.9 C) (Oral)  Resp 20  Ht 5\' 5"  (1.651 m)  Wt 186 lb 7 oz (84.567 kg)  BMI 31.02 kg/m2  SpO2 99%  Physical Exam  Nursing note and vitals reviewed. Constitutional: She is oriented to person, place, and time. She appears well-developed and well-nourished. No distress.  HENT:  Head: Normocephalic and atraumatic.  Right Ear: Tympanic membrane  normal. No drainage or swelling. Tympanic membrane is not bulging. No middle ear effusion.  Left Ear: Tympanic membrane normal. No drainage or swelling. Tympanic membrane is not bulging.  No middle ear effusion.  No tenderness over mastoid process. Left side postauricular lymphadenopathy. No preauricular lymphadenopathy.   Eyes: EOM are normal.  Neck: Neck supple. No tracheal deviation present.  Cardiovascular: Normal rate.   Pulmonary/Chest: Effort normal. No respiratory distress.  Musculoskeletal: Normal range of motion.  Lymphadenopathy:       Head (right side): No preauricular and no posterior auricular adenopathy present.       Head (left side): Posterior auricular adenopathy present. No preauricular adenopathy present.    She has no cervical adenopathy.  Neurological: She is alert and oriented to person, place, and time.  Cranial nerves 2-12 intact   Skin: Skin is warm and dry.  Psychiatric: She has a normal mood and affect. Her behavior is normal.    ED Course  Procedures (including critical care time) Medications  ibuprofen (ADVIL,MOTRIN) 400 MG tablet (not administered)  ibuprofen (ADVIL,MOTRIN) 200 MG tablet (not administered)  ibuprofen (ADVIL,MOTRIN) tablet 600 mg (600 mg Oral Given 11/30/12 2300)    Labs Reviewed - No data to display No results found.   No diagnosis found.    MDM  I personally performed the services described in this documentation, which was scribed in my presence. The recorded information has been reviewed and is accurate.  DDX includes: Primary headaches - including migrainous headaches, cluster headaches, tension headaches. ICH Carotid dissection Cavernous sinus thrombosis Meningitis Encephalitis Sinusitis Tumor Vascular headaches AV malformation Brain aneurysm Muscular headaches  A/P: Pt comes in with cc of headaches. No concerns for life threatening secondary headaches because the neuro exam is normal, there is no thunder clap  type headaches, and no risk factors for infection, aneurysms.  The ear exam is normal, there is no mastoid tenderness.  Pt keeps on stating that she has pain over the temporal region, and not a headache. I don't suspect a primary headache. It seems like almost a neuralgia - affecting specific dermatomal region - but i am not a sure if i can diagnose that either unequivocally.  We have requested patent to take antiinflammatory. See her pcp if not better in 3-5 days.     Derwood Kaplan, MD 12/02/12 [phone]

## 2015-07-29 NOTE — Progress Notes (Signed)
NEED ORDERS IN EPIC PLEASE - PT COMING FOR PREOP 08/02/15 - [place]

## 2015-07-29 NOTE — Patient Instructions (Addendum)
YOUR PROCEDURE IS SCHEDULED ON : 08/08/15  REPORT TO [facility] MAIN ENTRANCE FOLLOW SIGNS TO EAST ELEVATOR - GO TO 3rd FLOOR CHECK IN AT 3 EAST NURSES STATION (SHORT STAY) AT:  10:45 AM  CALL THIS NUMBER IF YOU HAVE PROBLEMS THE MORNING OF SURGERY [phone]  REMEMBER:ONLY 1 PER PERSON MAY GO TO SHORT STAY WITH YOU TO GET READY THE MORNING OF YOUR SURGERY  DO NOT EAT FOOD  AFTER MIDNIGHT  MAY HAVE CLEAR LIQUIDS UNTIL 7:45 AM  TAKE THESE MEDICINES THE MORNING OF SURGERY:  DILTIAZEM / ESTRADIOL / PANTOPRAZOLE  CLEAR LIQUID DIET  Foods Allowed                                                                     Foods Excluded  Coffee and tea, regular and decaf                             liquids that you cannot  Plain Jell-O in any flavor                                             see through such as: Fruit ices (not with fruit pulp)                                     milk, soups, orange juice  Iced Popsicles                                                        All solid food Carbonated beverages, regular and diet                                    Cranberry, grape and apple juices Sports drinks like Gatorade Lightly seasoned clear broth or consume(fat free) Sugar, honey syrup  _____________________________________________________________________    YOU MAY NOT HAVE ANY METAL ON YOUR BODY INCLUDING HAIR PINS AND PIERCING'S. DO NOT WEAR JEWELRY, MAKEUP, LOTIONS, POWDERS OR PERFUMES. DO NOT WEAR NAIL POLISH. DO NOT SHAVE 48 HRS PRIOR TO SURGERY. MEN MAY SHAVE FACE AND NECK.  DO NOT [facility]. [facility] IS NOT RESPONSIBLE FOR VALUABLES.  CONTACTS, DENTURES OR PARTIALS MAY NOT BE WORN TO SURGERY. LEAVE SUITCASE IN CAR. CAN BE BROUGHT TO ROOM AFTER SURGERY.  PATIENTS DISCHARGED THE DAY OF SURGERY WILL NOT BE ALLOWED TO DRIVE HOME.  PLEASE READ OVER THE FOLLOWING INSTRUCTION  SHEETS _________________________________________________________________________________                                          [facility] - PREPARING FOR SURGERY  Before surgery, you can play  an important role.  Because skin is not sterile, your skin needs to be as free of germs as possible.  You can reduce the number of germs on your skin by washing with CHG (chlorahexidine gluconate) soap before surgery.  CHG is an antiseptic cleaner which kills germs and bonds with the skin to continue killing germs even after washing. Please DO NOT use if you have an allergy to CHG or antibacterial soaps.  If your skin becomes reddened/irritated stop using the CHG and inform your nurse when you arrive at Short Stay. Do not shave (including legs and underarms) for at least 48 hours prior to the first CHG shower.  You may shave your face. Please follow these instructions carefully:   1.  Shower with CHG Soap the night before surgery and the  morning of Surgery.   2.  If you choose to wash your hair, wash your hair first as usual with your  normal  Shampoo.   3.  After you shampoo, rinse your hair and body thoroughly to remove the  shampoo.                                         4.  Use CHG as you would any other liquid soap.  You can apply chg directly  to the skin and wash . Gently wash with scrungie or clean wascloth    5.  Apply the CHG Soap to your body ONLY FROM THE NECK DOWN.   Do not use on open                           Wound or open sores. Avoid contact with eyes, ears mouth and genitals (private parts).                        Genitals (private parts) with your normal soap.              6.  Wash thoroughly, paying special attention to the area where your surgery  will be performed.   7.  Thoroughly rinse your body with warm water from the neck down.   8.  DO NOT shower/wash with your normal soap after using and rinsing off  the CHG Soap .                9.  Pat yourself dry with a clean  towel.             10.  Wear clean night clothes to bed after shower             11.  Place clean sheets on your bed the night of your first shower and do not  sleep with pets.  Day of Surgery : Do not apply any lotions/deodorants the morning of surgery.  Please wear clean clothes to the hospital/surgery center.  FAILURE TO FOLLOW THESE INSTRUCTIONS MAY RESULT IN THE CANCELLATION OF YOUR SURGERY    PATIENT SIGNATURE_________________________________  ______________________________________________________________________     Adam Phenix  An incentive spirometer is a tool that can help keep your lungs clear and active. This tool measures how well you are filling your lungs with each breath. Taking long deep breaths may help reverse or decrease the chance of developing breathing (pulmonary) problems (especially infection) following:  A long period of time when you  are unable to move or be active. BEFORE THE PROCEDURE   If the spirometer includes an indicator to show your best effort, your nurse or respiratory therapist will set it to a desired goal.  If possible, sit up straight or lean slightly forward. Try not to slouch.  Hold the incentive spirometer in an upright position. INSTRUCTIONS FOR USE   Sit on the edge of your bed if possible, or sit up as far as you can in bed or on a chair.  Hold the incentive spirometer in an upright position.  Breathe out normally.  Place the mouthpiece in your mouth and seal your lips tightly around it.  Breathe in slowly and as deeply as possible, raising the piston or the ball toward the top of the column.  Hold your breath for 3-5 seconds or for as long as possible. Allow the piston or ball to fall to the bottom of the column.  Remove the mouthpiece from your mouth and breathe out normally.  Rest for a few seconds and repeat Steps 1 through 7 at least 10 times every 1-2 hours when you are awake. Take your time and take a few  normal breaths between deep breaths.  The spirometer may include an indicator to show your best effort. Use the indicator as a goal to work toward during each repetition.  After each set of 10 deep breaths, practice coughing to be sure your lungs are clear. If you have an incision (the cut made at the time of surgery), support your incision when coughing by placing a pillow or rolled up towels firmly against it. Once you are able to get out of bed, walk around indoors and cough well. You may stop using the incentive spirometer when instructed by your caregiver.  RISKS AND COMPLICATIONS  Take your time so you do not get dizzy or light-headed.  If you are in pain, you may need to take or ask for pain medication before doing incentive spirometry. It is harder to take a deep breath if you are having pain. AFTER USE  Rest and breathe slowly and easily.  It can be helpful to keep track of a log of your progress. Your caregiver can provide you with a simple table to help with this. If you are using the spirometer at home, follow these instructions: Jess[place] IF:   You are having difficultly using the spirometer.  You have trouble using the spirometer as often as instructed.  Your pain medication is not giving enough relief while using the spirometer.  You develop fever of 100.5 F (38.1 C) or higher. SEEK IMMEDIATE MEDICAL CARE IF:   You cough up bloody sputum that had not been present before.  You develop fever of 102 F (38.9 C) or greater.  You develop worsening pain at or near the incision site. MAKE SURE YOU:   Understand these instructions.  Will watch your condition.  Will get help right away if you are not doing well or get worse. Document Released: 11/19/2006 Document Revised: 10/01/2011 Document Reviewed: 01/20/2007 ExitCare Patient Information 2014 [employer], [place].   ________________________________________________________________________  WHAT IS A BLOOD  TRANSFUSION? Blood Transfusion Information  A transfusion is the replacement of blood or some of its parts. Blood is made up of multiple cells which provide different functions.  Red blood cells carry oxygen and are used for blood loss replacement.  White blood cells fight against infection.  Platelets control bleeding.  Plasma helps clot blood.  Other blood  products are available for specialized needs, such as hemophilia or other clotting disorders. BEFORE THE TRANSFUSION  Who gives blood for transfusions?   Healthy volunteers who are fully evaluated to make sure their blood is safe. This is blood bank blood. Transfusion therapy is the safest it has ever been in the practice of medicine. Before blood is taken from a donor, a complete history is taken to make sure that person has no history of diseases nor engages in risky social behavior (examples are intravenous drug use or sexual activity with multiple partners). The donor's travel history is screened to minimize risk of transmitting infections, such as malaria. The donated blood is tested for signs of infectious diseases, such as HIV and hepatitis. The blood is then tested to be sure it is compatible with you in order to minimize the chance of a transfusion reaction. If you or a relative donates blood, this is often done in anticipation of surgery and is not appropriate for emergency situations. It takes many days to process the donated blood. RISKS AND COMPLICATIONS Although transfusion therapy is very safe and saves many lives, the main dangers of transfusion include:   Getting an infectious disease.  Developing a transfusion reaction. This is an allergic reaction to something in the blood you were given. Every precaution is taken to prevent this. The decision to have a blood transfusion has been considered carefully by your caregiver before blood is given. Blood is not given unless the benefits outweigh the risks. AFTER THE  TRANSFUSION  Right after receiving a blood transfusion, you will usually feel much better and more energetic. This is especially true if your red blood cells have gotten low (anemic). The transfusion raises the level of the red blood cells which carry oxygen, and this usually causes an energy increase.  The nurse administering the transfusion will monitor you carefully for complications. HOME CARE INSTRUCTIONS  No special instructions are needed after a transfusion. You may find your energy is better. Speak with your caregiver about any limitations on activity for underlying diseases you may have. SEEK MEDICAL CARE IF:   Your condition is not improving after your transfusion.  You develop redness or irritation at the intravenous (IV) site. SEEK IMMEDIATE MEDICAL CARE IF:  Any of the following symptoms occur over the next 12 hours:  Shaking chills.  You have a temperature by mouth above 102 F (38.9 C), not controlled by medicine.  Chest, back, or muscle pain.  People around you feel you are not acting correctly or are confused.  Shortness of breath or difficulty breathing.  Dizziness and fainting.  You get a rash or develop hives.  You have a decrease in urine output.  Your urine turns a dark color or changes to pink, red, or brown. Any of the following symptoms occur over the next 10 days:  You have a temperature by mouth above 102 F (38.9 C), not controlled by medicine.  Shortness of breath.  Weakness after normal activity.  The white part of the eye turns yellow (jaundice).  You have a decrease in the amount of urine or are urinating less often.  Your urine turns a dark color or changes to pink, red, or brown. Document Released: 07/06/2000 Document Revised: 10/01/2011 Document Reviewed: 02/23/2008 [facility] Patient Information 2014 [place], [place].  _______________________________________________________________________

## 2015-08-01 ENCOUNTER — Ambulatory Visit: Payer: Self-pay | Admitting: Orthopedic Surgery

## 2015-08-01 ENCOUNTER — Other Ambulatory Visit: Payer: Self-pay | Admitting: Surgical

## 2015-08-01 NOTE — Progress Notes (Signed)
Preoperative surgical orders have been place into the Epic hospital system for Dawn Collins on 08/01/2015, 12:04 PM  by Dawn Collins for surgery on 08/08/2015.  Preop Total Knee orders including Experal, IV Tylenol, and IV Decadron as long as there are no contraindications to the above medications. Arlee Muslim, PA-C

## 2015-08-02 ENCOUNTER — Encounter (HOSPITAL_COMMUNITY): Payer: Self-pay

## 2015-08-02 ENCOUNTER — Encounter (HOSPITAL_COMMUNITY)
Admission: RE | Admit: 2015-08-02 | Discharge: 2015-08-02 | Disposition: A | Discharge status: Home / Self Care | Payer: Medicare Other | Source: Ambulatory Visit | Attending: Orthopedic Surgery | Admitting: Orthopedic Surgery

## 2015-08-02 DIAGNOSIS — Z01812 Encounter for preprocedural laboratory examination: Secondary | ICD-10-CM | POA: Insufficient documentation

## 2015-08-02 DIAGNOSIS — I10 Essential (primary) hypertension: Secondary | ICD-10-CM | POA: Insufficient documentation

## 2015-08-02 DIAGNOSIS — J45909 Unspecified asthma, uncomplicated: Secondary | ICD-10-CM | POA: Insufficient documentation

## 2015-08-02 DIAGNOSIS — K219 Gastro-esophageal reflux disease without esophagitis: Secondary | ICD-10-CM | POA: Insufficient documentation

## 2015-08-02 HISTORY — DX: Adverse effect of unspecified anesthetic, initial encounter: T41.45XA

## 2015-08-02 HISTORY — DX: Other complications of anesthesia, initial encounter: T88.59XA

## 2015-08-02 HISTORY — DX: Other specified postprocedural states: Z98.890

## 2015-08-02 HISTORY — DX: Nontoxic multinodular goiter: E04.2

## 2015-08-02 HISTORY — DX: Nausea with vomiting, unspecified: R11.2

## 2015-08-02 LAB — URINALYSIS, ROUTINE W REFLEX MICROSCOPIC
Bilirubin Urine: NEGATIVE
Glucose, UA: NEGATIVE mg/dL
Hgb urine dipstick: NEGATIVE
KETONES UR: NEGATIVE mg/dL
LEUKOCYTES UA: NEGATIVE
NITRITE: NEGATIVE
PROTEIN: NEGATIVE mg/dL
Specific Gravity, Urine: 1.024 (ref 1.005–1.030)
pH: 7.5 (ref 5.0–8.0)

## 2015-08-02 LAB — COMPREHENSIVE METABOLIC PANEL
ALBUMIN: 3.9 g/dL (ref 3.5–5.0)
ALK PHOS: 49 U/L (ref 38–126)
ALT: 15 U/L (ref 14–54)
AST: 18 U/L (ref 15–41)
Anion gap: 9 (ref 5–15)
BUN: 21 mg/dL — AB (ref 6–20)
CALCIUM: 9.3 mg/dL (ref 8.9–10.3)
CHLORIDE: 101 mmol/L (ref 101–111)
CO2: 27 mmol/L (ref 22–32)
CREATININE: 0.98 mg/dL (ref 0.44–1.00)
GFR calc non Af Amer: 57 mL/min — ABNORMAL LOW (ref >60)
GLUCOSE: 104 mg/dL — AB (ref 65–99)
Potassium: 3.8 mmol/L (ref 3.5–5.1)
SODIUM: 137 mmol/L (ref 135–145)
Total Bilirubin: 0.8 mg/dL (ref 0.3–1.2)
Total Protein: 7.1 g/dL (ref 6.5–8.1)

## 2015-08-02 LAB — CBC
HCT: 40 % (ref 36.0–46.0)
HEMOGLOBIN: 13.3 g/dL (ref 12.0–15.0)
MCH: 29.6 pg (ref 26.0–34.0)
MCHC: 33.3 g/dL (ref 30.0–36.0)
MCV: 88.9 fL (ref 78.0–100.0)
Platelets: 246 10*3/uL (ref 150–400)
RBC: 4.5 MIL/uL (ref 3.87–5.11)
RDW: 12.9 % (ref 11.5–15.5)
WBC: 8.5 10*3/uL (ref 4.0–10.5)

## 2015-08-02 LAB — PROTIME-INR
INR: 1.06 (ref 0.00–1.49)
Prothrombin Time: 14 seconds (ref 11.6–15.2)

## 2015-08-02 LAB — APTT: aPTT: 27 seconds (ref 24–37)

## 2015-08-02 LAB — TYPE AND SCREEN
ABO/RH(D): AB POS
Antibody Screen: NEGATIVE

## 2015-08-02 LAB — SURGICAL PCR SCREEN
MRSA, PCR: NEGATIVE
Staphylococcus aureus: NEGATIVE

## 2015-08-07 ENCOUNTER — Ambulatory Visit: Payer: Self-pay | Admitting: Orthopedic Surgery

## 2015-08-07 NOTE — H&P (Signed)
Dawn Collins DOB: 14-Jun-1946 Married / Language: English / Race: Black or African American Female Date of Admission:  08/08/2015 CC:  Left Knee Pain History of Present Illness The patient is a 70 year old female who comes in for a preoperative History and Physical. The patient is scheduled for a left total knee arthroplasty to be performed by Dr. Dione Plover. Aluisio, MD at [facility] on 08-08-2015. The patient is being followed for their left knee pain and osteoarthritis. They are now 7 month(s) out from the last cortisone injection. Symptoms reported today include: pain, swelling, aching, pain with weightbearing and difficulty ambulating. The patient feels that they are doing poorly and report their pain level to be 10 / 10. The following medication has been used for pain control: Tylenol and Mobic. Note for "Follow-up Knee": Patient states that the injection lasted "a good while". She said that now her knee is hurting like it was before the injection. That cortisone shot lasted for a while, but unfortunately the pain is getting worse again. She is at a stage now where it is hurting all the time and limiting what she can and cannot do. She has had recurrent effusions in the left knee. She had her right knee replaced, did great with that. She is at a stage now where she wants to consider going ahead and getting the left knee replaced because of the worsening pain and worsening dysfunction. She has got advanced end-stage arthritis of left knee, progressively worsening pain and dysfunction. For long term, the most predictable means of getting her pain and function better is going to be a total knee arthroplasty. She is ready to proceed with surgical intervention at this time. They have been treated conservatively in the past for the above stated problem and despite conservative measures, they continue to have progressive pain and severe functional limitations and dysfunction. They have failed  non-operative management including home exercise, medications, and injections. It is felt that they would benefit from undergoing total joint replacement. Risks and benefits of the procedure have been discussed with the patient and they elect to proceed with surgery. There are no active contraindications to surgery such as ongoing infection or rapidly progressive neurological disease.  Problem List/Past Medical  Insomia  Sleep Apnea  Mild Obstructive Urinary Incontinence  Stress History of Sinusitis  Spinal Stenosis  Fatty Liver  Asthmatic Bronchitis  History of Bursitis  Cervical Spine Stenosis  Irritable bowel syndrome  Headaches  Nontoxic Multinodular Goiter  Anxiety Disorder  Diverticulosis  Carpal Tunnel Syndrome, Bilateral  Fibrocystic Breast Disease  Hypertension  Lumbar disc degeneration (M51.36)  Lumbar radiculopathy (M54.16)  Primary osteoarthritis of one knee, left (M17.12)  Trochanteric bursitis of left hip (M70.62)  Migraine Headache  High blood pressure  Osteoarthritis  Diverticulitis Of Colon  Gastroesophageal Reflux Disease  Asthma   Allergies Codeine/Codeine Derivatives  Vomiting. heart racing, sweating Penicillins  Rash. Darvocet A500 *ANALGESICS - OPIOID*  Vomiting. Floxin *Fluoroquinolones**  bruising HYDROcodone Bitartrate *CHEMICALS*  Nausea, Vomiting.  Family History Heart disease in female family member before age 85  Father  Deceased, Emphysema, Prostate Cancer. Age 30 Hypertension  First Degree Relatives. mother and father Mother  Deceased, Breast Cancer, Brain Cancer, Kidney disease. Age 53 Heart Disease  First Degree Relatives. brother Cancer  mother and father Kidney disease  mother Osteoarthritis  mother and father Chronic Obstructive Lung Disease  father  Social History Tobacco use  Never smoker. 08-02-14 Drug/Alcohol Rehab (Previously)  no Current work  status  Full-time. Engineer, structural  working full time Illicit drug use  no Marital status  Married. married Post-Surgical Plans  Plan is for Home Alcohol use  Never consumed alcohol. never consumed alcohol Pain Contract  no Exercise  Exercises monthly; does running / walking No alcohol use  08-02-14 Drug/Alcohol Rehab (Currently)  no Tobacco / smoke exposure  yes Children  1 son Living situation  Lives with spouse. live with spouse Previously in rehab  no Number of flights of stairs before winded  2-3 Most recent primary occupation  BEAUTICIAN  Medication History Estradiol ('2MG'$  Tablet, Oral) Active. Amitriptyline HCl ('25MG'$  Tablet, Oral as needed) Active. Pantoprazole Sodium ('40MG'$  Tablet DR, Oral) Active. Diltiazem HCl ER Coated Beads ('300MG'$  Capsule ER 24HR, 1 Oral daily) Active. Amiloride-Hydrochlorothiazide (5-'50MG'$  Tablet, 1 Oral daily) Active. Protonix ('20MG'$  Tablet DR, 1 Oral daily) Active. Losartan Potassium ('50MG'$  Tablet, 1 Oral daily) Active. Mobic (Oral) Specific strength unknown - Active.  Past Surgical History  Arthroscopic Knee Surgery - Right  Breast Surgery Nipple Exploartion, Bilateral  Cardiac Cath  Date: 09/2010. Dr. Hewitt Shorts Breast Surgery, Bilateral  Foot Surgery, Bilateral  Total Knee Replacement  right Hysterectomy  partial (non-cancerous) Breast Biopsy  bilateral, multiple times Tubal Ligation  Neck Disc Surgery  Date: 59. Cervical Lam Hysterectomy (not due to cancer) - Partial    Review of Systems General Not Present- Chills, Fatigue, Fever, Memory Loss, Night Sweats, Weight Gain and Weight Loss. Skin Not Present- Eczema, Hives, Itching, Lesions and Rash. HEENT Not Present- Dentures, Double Vision, Headache, Hearing Loss, Tinnitus and Visual Loss. Respiratory Present- Wheezing (occasional due to asthma). Not Present- Allergies, Chronic Cough, Coughing up blood, Shortness of breath at rest and Shortness of breath with exertion. Cardiovascular Not Present-  Chest Pain, Difficulty Breathing Lying Down, Murmur, Palpitations, Racing/skipping heartbeats and Swelling. Gastrointestinal Not Present- Abdominal Pain, Bloody Stool, Constipation, Diarrhea, Difficulty Swallowing, Heartburn, Jaundice, Loss of appetitie, Nausea and Vomiting. Female Genitourinary Not Present- Blood in Urine, Discharge, Flank Pain, Incontinence, Painful Urination, Urgency, Urinary frequency, Urinary Retention, Urinating at Night and Weak urinary stream. Musculoskeletal Present- Back Pain, Joint Pain and Morning Stiffness. Not Present- Joint Swelling, Muscle Pain, Muscle Weakness and Spasms. Neurological Not Present- Blackout spells, Difficulty with balance, Dizziness, Paralysis, Tremor and Weakness. Psychiatric Not Present- Insomnia.  Vitals Weight: 181 lb Height: 65.75in Weight was reported by patient. Height was reported by patient. Body Surface Area: 1.91 m Body Mass Index: 29.44 kg/m  BP: 142/60 (Sitting, Left Arm, Standard)  Physical Exam General Mental Status -Alert, cooperative and good historian. General Appearance-pleasant, Not in acute distress. Orientation-Oriented X3. Build & Nutrition-Well nourished and Well developed.  Head and Neck Head-normocephalic, atraumatic . Neck Global Assessment - supple, no bruit auscultated on the right, no bruit auscultated on the left.  Eye Pupil - Bilateral-Regular and Round. Motion - Bilateral-EOMI.  Chest and Lung Exam Auscultation Breath sounds - clear at anterior chest wall and clear at posterior chest wall. Adventitious sounds - No Adventitious sounds.  Cardiovascular Auscultation Rhythm - Regular rate and rhythm. Heart Sounds - S1 WNL and S2 WNL. Murmurs & Other Heart Sounds - Auscultation of the heart reveals - No Murmurs.  Abdomen Palpation/Percussion Tenderness - Abdomen is non-tender to palpation. Rigidity (guarding) - Abdomen is soft. Auscultation Auscultation of the abdomen reveals  - Bowel sounds normal.  Female Genitourinary Note: Not done, not pertinent to present illness   Musculoskeletal Note: This is a well-developed, well-nourished lady in no acute distress. Her knee shows no  effusion. She has crepitation throughout the range of motion. Sensation and circulation are intact.  RADIOGRAPHS Previous x-rays are reviewed and reveal advanced osteoarthritis especially in the lateral and patellofemoral compartments.  Assessment & Plan Primary osteoarthritis of one knee, left (M17.12)  Note:Surgical Plans: Left Total Knee Replacement  Disposition: Home with Sister  PCP: Dr. Tiana Loft  IV TXA  Anesthesia Issues: None  Signed electronically by Joelene Millin, III PA-C

## 2015-08-08 ENCOUNTER — Inpatient Hospital Stay (HOSPITAL_COMMUNITY): Payer: Medicare Other | Admitting: Anesthesiology

## 2015-08-08 ENCOUNTER — Encounter (HOSPITAL_COMMUNITY): Payer: Self-pay | Admitting: *Deleted

## 2015-08-08 ENCOUNTER — Inpatient Hospital Stay (HOSPITAL_COMMUNITY)
Admission: RE | Admit: 2015-08-08 | Discharge: 2015-08-11 | DRG: 470 | Disposition: A | Discharge status: Home Health | Payer: Medicare Other | Source: Ambulatory Visit | Attending: Orthopedic Surgery | Admitting: Orthopedic Surgery

## 2015-08-08 ENCOUNTER — Encounter (HOSPITAL_COMMUNITY)
Admission: RE | Disposition: A | Discharge status: Home Health | Payer: Self-pay | Source: Ambulatory Visit | Attending: Orthopedic Surgery

## 2015-08-08 DIAGNOSIS — M25562 Pain in left knee: Secondary | ICD-10-CM | POA: Diagnosis present

## 2015-08-08 DIAGNOSIS — K219 Gastro-esophageal reflux disease without esophagitis: Secondary | ICD-10-CM | POA: Diagnosis present

## 2015-08-08 DIAGNOSIS — I10 Essential (primary) hypertension: Secondary | ICD-10-CM | POA: Diagnosis present

## 2015-08-08 DIAGNOSIS — N393 Stress incontinence (female) (male): Secondary | ICD-10-CM | POA: Diagnosis present

## 2015-08-08 DIAGNOSIS — Z01812 Encounter for preprocedural laboratory examination: Secondary | ICD-10-CM

## 2015-08-08 DIAGNOSIS — R112 Nausea with vomiting, unspecified: Secondary | ICD-10-CM | POA: Diagnosis not present

## 2015-08-08 DIAGNOSIS — Z79899 Other long term (current) drug therapy: Secondary | ICD-10-CM | POA: Diagnosis not present

## 2015-08-08 DIAGNOSIS — G4733 Obstructive sleep apnea (adult) (pediatric): Secondary | ICD-10-CM | POA: Diagnosis present

## 2015-08-08 DIAGNOSIS — M1712 Unilateral primary osteoarthritis, left knee: Secondary | ICD-10-CM | POA: Diagnosis present

## 2015-08-08 DIAGNOSIS — M179 Osteoarthritis of knee, unspecified: Secondary | ICD-10-CM | POA: Diagnosis present

## 2015-08-08 DIAGNOSIS — M1710 Unilateral primary osteoarthritis, unspecified knee: Secondary | ICD-10-CM | POA: Diagnosis present

## 2015-08-08 HISTORY — PX: TOTAL KNEE ARTHROPLASTY: SHX125

## 2015-08-08 SURGERY — ARTHROPLASTY, KNEE, TOTAL
Anesthesia: Spinal | Site: Knee | Laterality: Left

## 2015-08-08 MED ORDER — DEXAMETHASONE SODIUM PHOSPHATE 10 MG/ML IJ SOLN
10.0000 mg | Freq: Once | INTRAMUSCULAR | Status: AC
  Administered 2015-08-08: 10 mg via INTRAVENOUS

## 2015-08-08 MED ORDER — PROPOFOL 500 MG/50ML IV EMUL
INTRAVENOUS | Status: DC | PRN
  Administered 2015-08-08: 50 ug/kg/min via INTRAVENOUS

## 2015-08-08 MED ORDER — FENTANYL CITRATE (PF) 100 MCG/2ML IJ SOLN: 25.0000 ug | INTRAMUSCULAR | Status: DC | PRN

## 2015-08-08 MED ORDER — SODIUM CHLORIDE 0.9 % IJ SOLN
INTRAMUSCULAR | Status: DC | PRN
  Administered 2015-08-08: 30 mL

## 2015-08-08 MED ORDER — FLEET ENEMA 7-19 GM/118ML RE ENEM: 1.0000 | ENEMA | Freq: Once | RECTAL | Status: DC | PRN

## 2015-08-08 MED ORDER — ACETAMINOPHEN 10 MG/ML IV SOLN
INTRAVENOUS | Status: AC
  Filled 2015-08-08: qty 100

## 2015-08-08 MED ORDER — HYDROMORPHONE HCL 2 MG PO TABS
2.0000 mg | ORAL_TABLET | ORAL | Status: DC | PRN
  Administered 2015-08-08 – 2015-08-11 (×13): 2 mg via ORAL
  Filled 2015-08-08 (×7): qty 1
  Filled 2015-08-08: qty 2
  Filled 2015-08-08 (×6): qty 1

## 2015-08-08 MED ORDER — ACETAMINOPHEN 325 MG PO TABS
650.0000 mg | ORAL_TABLET | Freq: Four times a day (QID) | ORAL | Status: DC | PRN
  Administered 2015-08-09: 650 mg via ORAL
  Filled 2015-08-08: qty 2

## 2015-08-08 MED ORDER — APIXABAN 2.5 MG PO TABS
2.5000 mg | ORAL_TABLET | Freq: Two times a day (BID) | ORAL | Status: DC
  Administered 2015-08-09 – 2015-08-11 (×5): 2.5 mg via ORAL
  Filled 2015-08-08 (×7): qty 1

## 2015-08-08 MED ORDER — CHLORHEXIDINE GLUCONATE 4 % EX LIQD: 60.0000 mL | Freq: Once | CUTANEOUS | Status: DC

## 2015-08-08 MED ORDER — FENTANYL CITRATE (PF) 100 MCG/2ML IJ SOLN
INTRAMUSCULAR | Status: DC | PRN
  Administered 2015-08-08 (×4): 25 ug via INTRAVENOUS

## 2015-08-08 MED ORDER — PANTOPRAZOLE SODIUM 40 MG PO TBEC
40.0000 mg | DELAYED_RELEASE_TABLET | Freq: Every day | ORAL | Status: DC
  Administered 2015-08-09 – 2015-08-11 (×3): 40 mg via ORAL
  Filled 2015-08-08 (×4): qty 1

## 2015-08-08 MED ORDER — ONDANSETRON HCL 4 MG/2ML IJ SOLN: 4.0000 mg | Freq: Once | INTRAMUSCULAR | Status: DC | PRN

## 2015-08-08 MED ORDER — MIDAZOLAM HCL 5 MG/5ML IJ SOLN
INTRAMUSCULAR | Status: DC | PRN
  Administered 2015-08-08 (×2): 1 mg via INTRAVENOUS

## 2015-08-08 MED ORDER — ACETAMINOPHEN 500 MG PO TABS
1000.0000 mg | ORAL_TABLET | Freq: Four times a day (QID) | ORAL | Status: AC
  Administered 2015-08-08 – 2015-08-09 (×2): 1000 mg via ORAL
  Administered 2015-08-09: 500 mg via ORAL
  Administered 2015-08-09: 1000 mg via ORAL
  Filled 2015-08-08 (×4): qty 2

## 2015-08-08 MED ORDER — ONDANSETRON HCL 4 MG PO TABS
4.0000 mg | ORAL_TABLET | Freq: Four times a day (QID) | ORAL | Status: DC | PRN
  Administered 2015-08-09: 4 mg via ORAL
  Filled 2015-08-08: qty 1

## 2015-08-08 MED ORDER — BUPIVACAINE HCL 0.25 % IJ SOLN
INTRAMUSCULAR | Status: DC | PRN
  Administered 2015-08-08: 30 mL

## 2015-08-08 MED ORDER — MIDAZOLAM HCL 2 MG/2ML IJ SOLN
INTRAMUSCULAR | Status: AC
  Filled 2015-08-08: qty 2

## 2015-08-08 MED ORDER — LIDOCAINE HCL (CARDIAC) 20 MG/ML IV SOLN
INTRAVENOUS | Status: AC
  Filled 2015-08-08: qty 5

## 2015-08-08 MED ORDER — GABAPENTIN 300 MG PO CAPS
300.0000 mg | ORAL_CAPSULE | Freq: Every day | ORAL | Status: DC | PRN
  Filled 2015-08-08: qty 1

## 2015-08-08 MED ORDER — POLYETHYLENE GLYCOL 3350 17 G PO PACK: 17.0000 g | PACK | Freq: Every day | ORAL | Status: DC | PRN

## 2015-08-08 MED ORDER — BISACODYL 10 MG RE SUPP: 10.0000 mg | Freq: Every day | RECTAL | Status: DC | PRN

## 2015-08-08 MED ORDER — CEFAZOLIN SODIUM-DEXTROSE 2-3 GM-% IV SOLR
INTRAVENOUS | Status: AC
  Filled 2015-08-08: qty 50

## 2015-08-08 MED ORDER — BUPIVACAINE HCL (PF) 0.25 % IJ SOLN
INTRAMUSCULAR | Status: AC
  Filled 2015-08-08: qty 30

## 2015-08-08 MED ORDER — SODIUM CHLORIDE 0.9 % IJ SOLN
INTRAMUSCULAR | Status: AC
  Filled 2015-08-08: qty 10

## 2015-08-08 MED ORDER — PROPOFOL 10 MG/ML IV BOLUS
INTRAVENOUS | Status: AC
  Filled 2015-08-08: qty 40

## 2015-08-08 MED ORDER — DEXAMETHASONE SODIUM PHOSPHATE 10 MG/ML IJ SOLN
10.0000 mg | Freq: Once | INTRAMUSCULAR | Status: AC
  Administered 2015-08-09: 10 mg via INTRAVENOUS
  Filled 2015-08-08: qty 1

## 2015-08-08 MED ORDER — TRANEXAMIC ACID 1000 MG/10ML IV SOLN
1000.0000 mg | INTRAVENOUS | Status: AC
  Administered 2015-08-08: 1000 mg via INTRAVENOUS
  Filled 2015-08-08: qty 10

## 2015-08-08 MED ORDER — LACTATED RINGERS IV SOLN
INTRAVENOUS | Status: DC
  Administered 2015-08-08: 14:00:00 via INTRAVENOUS
  Administered 2015-08-08: 1000 mL via INTRAVENOUS

## 2015-08-08 MED ORDER — BUPIVACAINE IN DEXTROSE 0.75-8.25 % IT SOLN
INTRATHECAL | Status: DC | PRN
  Administered 2015-08-08: 2 mL via INTRATHECAL

## 2015-08-08 MED ORDER — BUPIVACAINE LIPOSOME 1.3 % IJ SUSP
INTRAMUSCULAR | Status: DC | PRN
  Administered 2015-08-08: 20 mL

## 2015-08-08 MED ORDER — ONDANSETRON HCL 4 MG/2ML IJ SOLN
INTRAMUSCULAR | Status: DC | PRN
  Administered 2015-08-08: 4 mg via INTRAVENOUS

## 2015-08-08 MED ORDER — DEXAMETHASONE SODIUM PHOSPHATE 10 MG/ML IJ SOLN
INTRAMUSCULAR | Status: AC
  Filled 2015-08-08: qty 1

## 2015-08-08 MED ORDER — TRIAMTERENE-HCTZ 75-50 MG PO TABS
1.0000 | ORAL_TABLET | Freq: Every day | ORAL | Status: DC
  Administered 2015-08-09 – 2015-08-11 (×3): 1 via ORAL
  Filled 2015-08-08 (×3): qty 1

## 2015-08-08 MED ORDER — DOCUSATE SODIUM 100 MG PO CAPS
100.0000 mg | ORAL_CAPSULE | Freq: Two times a day (BID) | ORAL | Status: DC
  Administered 2015-08-08 – 2015-08-11 (×6): 100 mg via ORAL

## 2015-08-08 MED ORDER — DILTIAZEM HCL ER COATED BEADS 300 MG PO CP24
300.0000 mg | ORAL_CAPSULE | Freq: Every day | ORAL | Status: DC
  Administered 2015-08-09 – 2015-08-11 (×3): 300 mg via ORAL
  Filled 2015-08-08 (×3): qty 1

## 2015-08-08 MED ORDER — ONDANSETRON HCL 4 MG/2ML IJ SOLN
INTRAMUSCULAR | Status: AC
Start: 2015-08-08 — End: 2015-08-08
  Filled 2015-08-08: qty 2

## 2015-08-08 MED ORDER — PHENOL 1.4 % MT LIQD: 1.0000 | OROMUCOSAL | Status: DC | PRN

## 2015-08-08 MED ORDER — FENTANYL CITRATE (PF) 100 MCG/2ML IJ SOLN
INTRAMUSCULAR | Status: AC
  Filled 2015-08-08: qty 2

## 2015-08-08 MED ORDER — METOCLOPRAMIDE HCL 10 MG PO TABS: 5.0000 mg | ORAL_TABLET | Freq: Three times a day (TID) | ORAL | Status: DC | PRN

## 2015-08-08 MED ORDER — VANCOMYCIN HCL IN DEXTROSE 1-5 GM/200ML-% IV SOLN
1000.0000 mg | Freq: Two times a day (BID) | INTRAVENOUS | Status: AC
  Administered 2015-08-09: 1000 mg via INTRAVENOUS
  Filled 2015-08-08: qty 200

## 2015-08-08 MED ORDER — 0.9 % SODIUM CHLORIDE (POUR BTL) OPTIME
TOPICAL | Status: DC | PRN
  Administered 2015-08-08: 1000 mL

## 2015-08-08 MED ORDER — TRAMADOL HCL 50 MG PO TABS: 50.0000 mg | ORAL_TABLET | Freq: Four times a day (QID) | ORAL | Status: DC | PRN

## 2015-08-08 MED ORDER — METOCLOPRAMIDE HCL 5 MG/ML IJ SOLN
5.0000 mg | Freq: Three times a day (TID) | INTRAMUSCULAR | Status: DC | PRN
  Administered 2015-08-08: 10 mg via INTRAVENOUS
  Filled 2015-08-08: qty 2

## 2015-08-08 MED ORDER — SODIUM CHLORIDE 0.9 % IJ SOLN
INTRAMUSCULAR | Status: AC
  Filled 2015-08-08: qty 50

## 2015-08-08 MED ORDER — ONDANSETRON HCL 4 MG/2ML IJ SOLN
4.0000 mg | Freq: Four times a day (QID) | INTRAMUSCULAR | Status: DC | PRN
  Administered 2015-08-08: 4 mg via INTRAVENOUS
  Filled 2015-08-08: qty 2

## 2015-08-08 MED ORDER — POTASSIUM CHLORIDE IN NACL 20-0.9 MEQ/L-% IV SOLN
INTRAVENOUS | Status: DC
  Administered 2015-08-08: 75 mL/h via INTRAVENOUS
  Administered 2015-08-09: 06:00:00 via INTRAVENOUS
  Filled 2015-08-08 (×5): qty 1000

## 2015-08-08 MED ORDER — HYDROMORPHONE HCL 1 MG/ML IJ SOLN: 0.5000 mg | INTRAMUSCULAR | Status: DC | PRN

## 2015-08-08 MED ORDER — METHOCARBAMOL 1000 MG/10ML IJ SOLN
500.0000 mg | Freq: Four times a day (QID) | INTRAVENOUS | Status: DC | PRN
  Filled 2015-08-08: qty 5

## 2015-08-08 MED ORDER — CEFAZOLIN SODIUM-DEXTROSE 2-3 GM-% IV SOLR
2.0000 g | INTRAVENOUS | Status: AC
  Administered 2015-08-08: 2 g via INTRAVENOUS

## 2015-08-08 MED ORDER — LATANOPROST 0.005 % OP SOLN
1.0000 [drp] | Freq: Every day | OPHTHALMIC | Status: DC
  Administered 2015-08-08 – 2015-08-10 (×3): 1 [drp] via OPHTHALMIC
  Filled 2015-08-08: qty 2.5

## 2015-08-08 MED ORDER — BUPIVACAINE LIPOSOME 1.3 % IJ SUSP
20.0000 mL | Freq: Once | INTRAMUSCULAR | Status: DC
  Filled 2015-08-08: qty 20

## 2015-08-08 MED ORDER — ACETAMINOPHEN 10 MG/ML IV SOLN
1000.0000 mg | Freq: Once | INTRAVENOUS | Status: AC
  Administered 2015-08-08: 1000 mg via INTRAVENOUS

## 2015-08-08 MED ORDER — PROPOFOL 10 MG/ML IV BOLUS
INTRAVENOUS | Status: AC
  Filled 2015-08-08: qty 20

## 2015-08-08 MED ORDER — LOSARTAN POTASSIUM 50 MG PO TABS
50.0000 mg | ORAL_TABLET | Freq: Every day | ORAL | Status: DC
  Administered 2015-08-09 – 2015-08-11 (×3): 50 mg via ORAL
  Filled 2015-08-08 (×3): qty 1

## 2015-08-08 MED ORDER — DIPHENHYDRAMINE HCL 12.5 MG/5ML PO ELIX: 12.5000 mg | ORAL_SOLUTION | ORAL | Status: DC | PRN

## 2015-08-08 MED ORDER — METHOCARBAMOL 500 MG PO TABS
500.0000 mg | ORAL_TABLET | Freq: Four times a day (QID) | ORAL | Status: DC | PRN
  Administered 2015-08-08 – 2015-08-11 (×7): 500 mg via ORAL
  Filled 2015-08-08 (×7): qty 1

## 2015-08-08 MED ORDER — EPHEDRINE SULFATE 50 MG/ML IJ SOLN
INTRAMUSCULAR | Status: AC
  Filled 2015-08-08: qty 1

## 2015-08-08 MED ORDER — SODIUM CHLORIDE 0.9 % IR SOLN
Status: DC | PRN
  Administered 2015-08-08: 1000 mL

## 2015-08-08 MED ORDER — ACETAMINOPHEN 650 MG RE SUPP: 650.0000 mg | Freq: Four times a day (QID) | RECTAL | Status: DC | PRN

## 2015-08-08 MED ORDER — SODIUM CHLORIDE 0.9 % IV SOLN: INTRAVENOUS | Status: DC

## 2015-08-08 MED ORDER — MENTHOL 3 MG MT LOZG: 1.0000 | LOZENGE | OROMUCOSAL | Status: DC | PRN

## 2015-08-08 SURGICAL SUPPLY — 51 items
BAG DECANTER FOR FLEXI CONT (MISCELLANEOUS) ×3 IMPLANT
BAG SPEC THK2 15X12 ZIP CLS (MISCELLANEOUS) ×1
BAG ZIPLOCK 12X15 (MISCELLANEOUS) ×3 IMPLANT
BANDAGE ACE 6X5 VEL STRL LF (GAUZE/BANDAGES/DRESSINGS) ×3 IMPLANT
BLADE SAG 18X100X1.27 (BLADE) ×3 IMPLANT
BLADE SAW SGTL 11.0X1.19X90.0M (BLADE) ×3 IMPLANT
BOWL SMART MIX CTS (DISPOSABLE) ×3 IMPLANT
CAP KNEE TOTAL 3 SIGMA ×2 IMPLANT
CEMENT HV SMART SET (Cement) ×6 IMPLANT
CLOSURE WOUND 1/2 X4 (GAUZE/BANDAGES/DRESSINGS) ×1
CLOTH BEACON ORANGE TIMEOUT ST (SAFETY) ×3 IMPLANT
CUFF TOURN SGL QUICK 34 (TOURNIQUET CUFF) ×3
CUFF TRNQT CYL 34X4X40X1 (TOURNIQUET CUFF) ×1 IMPLANT
DECANTER SPIKE VIAL GLASS SM (MISCELLANEOUS) ×3 IMPLANT
DRAPE U-SHAPE 47X51 STRL (DRAPES) ×3 IMPLANT
DRSG ADAPTIC 3X8 NADH LF (GAUZE/BANDAGES/DRESSINGS) ×3 IMPLANT
DRSG PAD ABDOMINAL 8X10 ST (GAUZE/BANDAGES/DRESSINGS) ×3 IMPLANT
DURAPREP 26ML APPLICATOR (WOUND CARE) ×3 IMPLANT
ELECT REM PT RETURN 9FT ADLT (ELECTROSURGICAL) ×3
ELECTRODE REM PT RTRN 9FT ADLT (ELECTROSURGICAL) ×1 IMPLANT
EVACUATOR 1/8 PVC DRAIN (DRAIN) ×3 IMPLANT
GAUZE SPONGE 4X4 12PLY STRL (GAUZE/BANDAGES/DRESSINGS) ×3 IMPLANT
GLOVE BIO SURGEON STRL SZ7.5 (GLOVE) IMPLANT
GLOVE BIO SURGEON STRL SZ8 (GLOVE) ×3 IMPLANT
GLOVE BIOGEL PI IND STRL 6.5 (GLOVE) IMPLANT
GLOVE BIOGEL PI IND STRL 8 (GLOVE) ×1 IMPLANT
GLOVE BIOGEL PI INDICATOR 6.5 (GLOVE)
GLOVE BIOGEL PI INDICATOR 8 (GLOVE) ×2
GLOVE SURG SS PI 6.5 STRL IVOR (GLOVE) IMPLANT
GOWN STRL REUS W/TWL LRG LVL3 (GOWN DISPOSABLE) ×3 IMPLANT
GOWN STRL REUS W/TWL XL LVL3 (GOWN DISPOSABLE) IMPLANT
HANDPIECE INTERPULSE COAX TIP (DISPOSABLE) ×3
IMMOBILIZER KNEE 20 (SOFTGOODS) ×3
IMMOBILIZER KNEE 20 THIGH 36 (SOFTGOODS) ×1 IMPLANT
MANIFOLD NEPTUNE II (INSTRUMENTS) ×3 IMPLANT
NS IRRIG 1000ML POUR BTL (IV SOLUTION) ×3 IMPLANT
PACK TOTAL KNEE CUSTOM (KITS) ×3 IMPLANT
PADDING CAST COTTON 6X4 STRL (CAST SUPPLIES) ×5 IMPLANT
POSITIONER SURGICAL ARM (MISCELLANEOUS) ×3 IMPLANT
SET HNDPC FAN SPRY TIP SCT (DISPOSABLE) ×1 IMPLANT
STRIP CLOSURE SKIN 1/2X4 (GAUZE/BANDAGES/DRESSINGS) ×3 IMPLANT
SUT MNCRL AB 4-0 PS2 18 (SUTURE) ×3 IMPLANT
SUT VIC AB 2-0 CT1 27 (SUTURE) ×9
SUT VIC AB 2-0 CT1 TAPERPNT 27 (SUTURE) ×3 IMPLANT
SUT VLOC 180 0 24IN GS25 (SUTURE) ×3 IMPLANT
SYR 50ML LL SCALE MARK (SYRINGE) ×3 IMPLANT
TRAY FOLEY W/METER SILVER 14FR (SET/KITS/TRAYS/PACK) ×1 IMPLANT
TRAY FOLEY W/METER SILVER 16FR (SET/KITS/TRAYS/PACK) ×3 IMPLANT
WATER STERILE IRR 1500ML POUR (IV SOLUTION) ×3 IMPLANT
WRAP KNEE MAXI GEL POST OP (GAUZE/BANDAGES/DRESSINGS) ×3 IMPLANT
YANKAUER SUCT BULB TIP 10FT TU (MISCELLANEOUS) ×3 IMPLANT

## 2015-08-08 NOTE — Interval H&P Note (Signed)
History and Physical Interval Note:  08/08/2015 12:17 PM  Dawn Collins  has presented today for surgery, with the diagnosis of OA LEFT KNEE   The various methods of treatment have been discussed with the patient and family. After consideration of risks, benefits and other options for treatment, the patient has consented to  Procedure(s): TOTAL LEFT KNEE ARTHROPLASTY (Left) as a surgical intervention .  The patient's history has been reviewed, patient examined, no change in status, stable for surgery.  I have reviewed the patient's chart and labs.  Questions were answered to the patient's satisfaction.     Gearlean Alf

## 2015-08-08 NOTE — Anesthesia Preprocedure Evaluation (Addendum)
Anesthesia Evaluation  Patient identified by MRN, date of birth, ID band Patient awake    Reviewed: Allergy & Precautions, NPO status , Patient's Chart, lab work & pertinent test results  History of Anesthesia Complications (+) PONV and history of anesthetic complications  Airway Mallampati: II  TM Distance: >3 FB Neck ROM: Full    Dental no notable dental hx. (+) Dental Advisory Given, Poor Dentition, Missing   Pulmonary asthma , sleep apnea ,    Pulmonary exam normal breath sounds clear to auscultation       Cardiovascular hypertension, Pt. on medications Normal cardiovascular exam Rhythm:Regular Rate:Normal     Neuro/Psych negative neurological ROS  negative psych ROS   GI/Hepatic Neg liver ROS, GERD  ,  Endo/Other  obesity  Renal/GU negative Renal ROS  negative genitourinary   Musculoskeletal  (+) Arthritis ,   Abdominal   Peds negative pediatric ROS (+)  Hematology negative hematology ROS (+)   Anesthesia Other Findings   Reproductive/Obstetrics negative OB ROS                            Anesthesia Physical Anesthesia Plan  ASA: III  Anesthesia Plan: Spinal   Post-op Pain Management:    Induction: Intravenous  Airway Management Planned:   Additional Equipment:   Intra-op Plan:   Post-operative Plan:   Informed Consent: I have reviewed the patients History and Physical, chart, labs and discussed the procedure including the risks, benefits and alternatives for the proposed anesthesia with the patient or authorized representative who has indicated his/her understanding and acceptance.   Dental advisory given  Plan Discussed with: CRNA  Anesthesia Plan Comments:         Anesthesia Quick Evaluation

## 2015-08-08 NOTE — Transfer of Care (Signed)
Immediate Anesthesia Transfer of Care Note  Patient: Dawn Collins  Procedure(s) Performed: Procedure(s): TOTAL LEFT KNEE ARTHROPLASTY (Left)  Patient Location: PACU  Anesthesia Type:Spinal  Level of Consciousness:  sedated, patient cooperative and responds to stimulation  Airway & Oxygen Therapy:Patient Spontanous Breathing and Patient connected to face mask oxgen  Post-op Assessment:  Report given to PACU RN and Post -op Vital signs reviewed and stable  Post vital signs:  Reviewed and stable, L1 spinal level  Last Vitals:  Filed Vitals:   08/08/15 1023  BP: 162/68  Pulse: 70  Temp: 36.5 C  Resp: 18    Complications: No apparent anesthesia complications

## 2015-08-08 NOTE — Anesthesia Postprocedure Evaluation (Signed)
Anesthesia Post Note  Patient: Dawn Collins  Procedure(s) Performed: Procedure(s) (LRB): TOTAL LEFT KNEE ARTHROPLASTY (Left)  Patient location during evaluation: PACU Anesthesia Type: Spinal and MAC Level of consciousness: awake and alert Pain management: pain level controlled Vital Signs Assessment: post-procedure vital signs reviewed and stable Respiratory status: spontaneous breathing and respiratory function stable Cardiovascular status: blood pressure returned to baseline and stable Postop Assessment: spinal receding Anesthetic complications: no    Last Vitals:  Filed Vitals:   08/08/15 1530 08/08/15 1545  BP: 120/55 135/60  Pulse: 56 51  Temp:  36.4 C  Resp: 16 14    Last Pain:  Filed Vitals:   08/08/15 1552  PainSc: 0-No pain    LLE Motor Response: Purposeful movement (08/08/15 1545) LLE Sensation: Decreased (08/08/15 1545) RLE Motor Response: Purposeful movement (08/08/15 1545) RLE Sensation: Decreased (08/08/15 1545) L Sensory Level: L3-Anterior knee, lower leg (08/08/15 1545) R Sensory Level: L3-Anterior knee, lower leg (08/08/15 1545)  Lyndon Chenoweth,W. EDMOND

## 2015-08-08 NOTE — Op Note (Signed)
Pre-operative diagnosis- Osteoarthritis Left knee(s)  Post-operative diagnosis- Osteoarthritis  Left knee(s)  Procedure-   Left Total Knee Arthroplasty  Surgeon- Dione Plover. Sonny Poth, MD  Assistant- Arlee Muslim, PA-C   Anesthesia-  Spinal   EBL- * No blood loss amount entered *   Drains Hemovac   Tourniquet time  Total Tourniquet Time Documented: Thigh (Left) - 33 minutes Total: Thigh (Left) - 33 minutes    Complications- None  Condition-PACU - hemodynamically stable.   Brief Clinical Note  Dawn Collins is a 70 y.o. year old female with end stage OA of her left knee with progressively worsening pain and dysfunction. She has constant pain, with activity and at rest and significant functional deficits with difficulties even with ADLs. She has had extensive non-op management including analgesics, injections of cortisone and viscosupplements, and home exercise program, but remains in significant pain with significant dysfunction. Radiographs show bone on bone arthritis lateral and patellofemoral with significant valgus deformity. She presents now for left Total Knee Arthroplasty.    Procedure in detail---       The patient is brought into the operating room and positioned supine on the operating table. After successful administration of Spinal anesthetic, a tourniquet is placed high on the Left thigh(s) and the lower extremity is prepped and draped in the usual sterile fashion. Time out is performed by the operating team and then the Left  lower extremity is wrapped in Esmarch, knee flexed and the tourniquet inflated to 300 mmHg.       A midline incision is made with a ten blade through the subcutaneous tissue to the level of the extensor mechanism. A fresh blade is used to make a lateral parapatellar arthrotomy due to the patients' valgus deformity. Soft tissue over the proximal lateral tibia is subperiosteally elevated to the joint line with a knife to the posterolateral corner but not  including the structures of the posterolateral corner. Soft tissue over the proximal medial tibia is elevated with attention being paid to avoiding the patellar tendon on the tibial tubercle. The patella is everted medially, knee flexed 90 degrees and the ACL and PCL are removed. Findings are bone on bone lateral and patellofemoral with large global osteophytes. .       The drill is used to create a starting hole in the distal femur and the canal is thoroughly irrigated with sterile saline to remove the fatty contents. The 5 degree Left  valgus alignment guide is placed into the femoral canal and the distal femoral cutting block is pinned to remove 10  mm off the distal femur. Resection is made with an oscillating saw.      The tibia is subluxed forward and the menisci are removed. The extramedullary alignment guide is placed referencing proximally at the medial aspect of the tibial tubercle and distally along the second metatarsal axis and tibial crest. The block is pinned to remove 63m off the more deficient lateral side. Resection is made with an oscillating saw. Size 3  is the most appropriate size for the tibia and the proximal tibia is prepared with the modular drill and keel punch for that size.      The femoral sizing guide is placed and size 3  is most appropriate. Rotation is marked off the epicondylar axis and confirmed by creating a rectangular flexion gap at 90 degrees. The size 3  cutting block is pinned in this rotation and the anterior, posterior and chamfer cuts are made with the oscillating saw.  The intercondylar block is then placed and that cut is made.      Trial size 3  tibial component, trial size 3  posterior stabilized femur and a 12.5  mm posterior stabilized rotating platform insert trial is placed. Full extension is achieved with excellent varus/valgus and   anterior/posterior balance throughout full range of motion. The patella is everted and thickness measured to be 22  mm. Free  hand resection is taken to 12 mm, a 35 template is placed, lug holes are drilled, trial patella is placed, and it tracks normally. Osteophytes are removed off the posterior femur with the trial in place. All trials are removed and the cut bone surfaces prepared with pulsatile lavage. Cement is mixed and once ready for implantation, the size 3  tibial implant, size 3 posterior stabilized femoral component, and the size 35  patella are cemented in place and the patella is held with the clamp. The trial insert is placed and the knee held in full extension. The Exparel (20 ml mixed with 30 ml saline) and then 20 ml of .25% Bupivicaine is injected into the extensor mechanism, posterior capsule, medial and lateral gutters and subcutaneous tissues. All extruded cement is removed and once the cement is hard the permanent 12.5  mm posterior stabilized rotating platform insert is placed into the tibial tray.      The wound is copiously irrigated with saline solution and the tourniquet is released for a total   tourniquet time of 33  minutes. Bleeding is identified and controlled with electrocautery. The extensor mechanism is closed with interrupted #1 V-loc leaving open a small area from the superior to inferior pole of the patella to serve as a mini lateral release. Flexion against gravity is 140  degrees and the patella tracks normally. Subcutaneous tissue is closed with 2.0 vicryl and subcuticular with running 4.0 Monocryl.The incision is cleaned and dried and steri-strips and a bulky sterile dressing are applied. The limb is placed into a knee immobilizer and the patient is awakened and transported to recovery in stable condition.      Please note that a surgical assistant was a medical necessity for this procedure in order to perform it in a safe and expeditious manner. Surgical assistant was necessary to retract the ligaments and vital neurovascular structures to prevent injury to them and also necessary for proper  positioning of the limb to allow for anatomic placement of the prosthesis.    Dione Plover Clare Fennimore, MD    08/08/2015, 2:17 PM

## 2015-08-08 NOTE — Progress Notes (Signed)
Utilization review completed.  

## 2015-08-08 NOTE — H&P (View-Only) (Signed)
Dawn Collins DOB: [birth year] Married / Language: English / Race: Black or African American Female Date of Admission:  08/08/2015 CC:  Left Knee Pain History of Present Illness The patient is a 70 year old female who comes in for a preoperative History and Physical. The patient is scheduled for a left total knee arthroplasty to be performed by Dr. Dione Plover. Aluisio, MD at [facility] on 08-08-2015. The patient is being followed for their left knee pain and osteoarthritis. They are now 7 month(s) out from the last cortisone injection. Symptoms reported today include: pain, swelling, aching, pain with weightbearing and difficulty ambulating. The patient feels that they are doing poorly and report their pain level to be 10 / 10. The following medication has been used for pain control: Tylenol and Mobic. Note for "Follow-up Knee": Patient states that the injection lasted "a good while". She said that now her knee is hurting like it was before the injection. That cortisone shot lasted for a while, but unfortunately the pain is getting worse again. She is at a stage now where it is hurting all the time and limiting what she can and cannot do. She has had recurrent effusions in the left knee. She had her right knee replaced, did great with that. She is at a stage now where she wants to consider going ahead and getting the left knee replaced because of the worsening pain and worsening dysfunction. She has got advanced end-stage arthritis of left knee, progressively worsening pain and dysfunction. For long term, the most predictable means of getting her pain and function better is going to be a total knee arthroplasty. She is ready to proceed with surgical intervention at this time. They have been treated conservatively in the past for the above stated problem and despite conservative measures, they continue to have progressive pain and severe functional limitations and dysfunction. They have failed  non-operative management including home exercise, medications, and injections. It is felt that they would benefit from undergoing total joint replacement. Risks and benefits of the procedure have been discussed with the patient and they elect to proceed with surgery. There are no active contraindications to surgery such as ongoing infection or rapidly progressive neurological disease.  Problem List/Past Medical  Insomia  Sleep Apnea  Mild Obstructive Urinary Incontinence  Stress History of Sinusitis  Spinal Stenosis  Fatty Liver  Asthmatic Bronchitis  History of Bursitis  Cervical Spine Stenosis  Irritable bowel syndrome  Headaches  Nontoxic Multinodular Goiter  Anxiety Disorder  Diverticulosis  Carpal Tunnel Syndrome, Bilateral  Fibrocystic Breast Disease  Hypertension  Lumbar disc degeneration (M51.36)  Lumbar radiculopathy (M54.16)  Primary osteoarthritis of one knee, left (M17.12)  Trochanteric bursitis of left hip (M70.62)  Migraine Headache  High blood pressure  Osteoarthritis  Diverticulitis Of Colon  Gastroesophageal Reflux Disease  Asthma   Allergies Codeine/Codeine Derivatives  Vomiting. heart racing, sweating Penicillins  Rash. Darvocet A500 *ANALGESICS - OPIOID*  Vomiting. Floxin *Fluoroquinolones**  bruising HYDROcodone Bitartrate *CHEMICALS*  Nausea, Vomiting.  Family History Heart disease in female family member before age 68  Father  Deceased, Emphysema, Prostate Cancer. Age 42 Hypertension  First Degree Relatives. mother and father Mother  Deceased, Breast Cancer, Brain Cancer, Kidney disease. Age 12 Heart Disease  First Degree Relatives. brother Cancer  mother and father Kidney disease  mother Osteoarthritis  mother and father Chronic Obstructive Lung Disease  father  Social History Tobacco use  Never smoker. 08-02-14 Drug/Alcohol Rehab (Previously)  no Current work  status  Full-time. Engineer, structural  working full time Illicit drug use  no Marital status  Married. married Post-Surgical Plans  Plan is for Home Alcohol use  Never consumed alcohol. never consumed alcohol Pain Contract  no Exercise  Exercises monthly; does running / walking No alcohol use  08-02-14 Drug/Alcohol Rehab (Currently)  no Tobacco / smoke exposure  yes Children  1 son Living situation  Lives with spouse. live with spouse Previously in rehab  no Number of flights of stairs before winded  2-3 Most recent primary occupation  BEAUTICIAN  Medication History Estradiol ('2MG'$  Tablet, Oral) Active. Amitriptyline HCl ('25MG'$  Tablet, Oral as needed) Active. Pantoprazole Sodium ('40MG'$  Tablet DR, Oral) Active. Diltiazem HCl ER Coated Beads ('300MG'$  Capsule ER 24HR, 1 Oral daily) Active. Amiloride-Hydrochlorothiazide (5-'50MG'$  Tablet, 1 Oral daily) Active. Protonix ('20MG'$  Tablet DR, 1 Oral daily) Active. Losartan Potassium ('50MG'$  Tablet, 1 Oral daily) Active. Mobic (Oral) Specific strength unknown - Active.  Past Surgical History  Arthroscopic Knee Surgery - Right  Breast Surgery Nipple Exploartion, Bilateral  Cardiac Cath  Date: 09/2010. Dr. Hewitt Shorts Breast Surgery, Bilateral  Foot Surgery, Bilateral  Total Knee Replacement  right Hysterectomy  partial (non-cancerous) Breast Biopsy  bilateral, multiple times Tubal Ligation  Neck Disc Surgery  Date: 39. Cervical Lam Hysterectomy (not due to cancer) - Partial    Review of Systems General Not Present- Chills, Fatigue, Fever, Memory Loss, Night Sweats, Weight Gain and Weight Loss. Skin Not Present- Eczema, Hives, Itching, Lesions and Rash. HEENT Not Present- Dentures, Double Vision, Headache, Hearing Loss, Tinnitus and Visual Loss. Respiratory Present- Wheezing (occasional due to asthma). Not Present- Allergies, Chronic Cough, Coughing up blood, Shortness of breath at rest and Shortness of breath with exertion. Cardiovascular Not Present-  Chest Pain, Difficulty Breathing Lying Down, Murmur, Palpitations, Racing/skipping heartbeats and Swelling. Gastrointestinal Not Present- Abdominal Pain, Bloody Stool, Constipation, Diarrhea, Difficulty Swallowing, Heartburn, Jaundice, Loss of appetitie, Nausea and Vomiting. Female Genitourinary Not Present- Blood in Urine, Discharge, Flank Pain, Incontinence, Painful Urination, Urgency, Urinary frequency, Urinary Retention, Urinating at Night and Weak urinary stream. Musculoskeletal Present- Back Pain, Joint Pain and Morning Stiffness. Not Present- Joint Swelling, Muscle Pain, Muscle Weakness and Spasms. Neurological Not Present- Blackout spells, Difficulty with balance, Dizziness, Paralysis, Tremor and Weakness. Psychiatric Not Present- Insomnia.  Vitals Weight: 181 lb Height: 65.75in Weight was reported by patient. Height was reported by patient. Body Surface Area: 1.91 m Body Mass Index: 29.44 kg/m  BP: 142/60 (Sitting, Left Arm, Standard)  Physical Exam General Mental Status -Alert, cooperative and good historian. General Appearance-pleasant, Not in acute distress. Orientation-Oriented X3. Build & Nutrition-Well nourished and Well developed.  Head and Neck Head-normocephalic, atraumatic . Neck Global Assessment - supple, no bruit auscultated on the right, no bruit auscultated on the left.  Eye Pupil - Bilateral-Regular and Round. Motion - Bilateral-EOMI.  Chest and Lung Exam Auscultation Breath sounds - clear at anterior chest wall and clear at posterior chest wall. Adventitious sounds - No Adventitious sounds.  Cardiovascular Auscultation Rhythm - Regular rate and rhythm. Heart Sounds - S1 WNL and S2 WNL. Murmurs & Other Heart Sounds - Auscultation of the heart reveals - No Murmurs.  Abdomen Palpation/Percussion Tenderness - Abdomen is non-tender to palpation. Rigidity (guarding) - Abdomen is soft. Auscultation Auscultation of the abdomen reveals  - Bowel sounds normal.  Female Genitourinary Note: Not done, not pertinent to present illness   Musculoskeletal Note: This is a well-developed, well-nourished lady in no acute distress. Her knee shows no  effusion. She has crepitation throughout the range of motion. Sensation and circulation are intact.  RADIOGRAPHS Previous x-rays are reviewed and reveal advanced osteoarthritis especially in the lateral and patellofemoral compartments.  Assessment & Plan Primary osteoarthritis of one knee, left (M17.12)  Note:Surgical Plans: Left Total Knee Replacement  Disposition: Home with Sister  PCP: Dr. Tiana Loft  IV TXA  Anesthesia Issues: None  Signed electronically by Joelene Millin, III PA-C

## 2015-08-08 NOTE — Anesthesia Procedure Notes (Signed)
Spinal Patient location during procedure: OR Staffing Anesthesiologist: Terryn Redner Performed by: anesthesiologist  Preanesthetic Checklist Completed: patient identified, site marked, surgical consent, pre-op evaluation, timeout performed, IV checked, risks and benefits discussed and monitors and equipment checked Spinal Block Patient position: sitting Prep: ChloraPrep Patient monitoring: continuous pulse ox, blood pressure and heart rate Approach: midline Location: L3-4 Injection technique: single-shot Needle Needle type: Sprotte  Needle gauge: 24 G Needle length: 9 cm Additional Notes Functioning IV was confirmed and monitors were applied. Sterile prep and drape, including hand hygiene, mask and sterile gloves were used. The patient was positioned and the spine was prepped. The skin was anesthetized with lidocaine.  Free flow of clear CSF was obtained prior to injecting local anesthetic into the CSF.  The spinal needle aspirated freely following injection.  The needle was carefully withdrawn.  The patient tolerated the procedure well. Consent was obtained prior to procedure with all questions answered and concerns addressed. Risks including but not limited to bleeding, infection, nerve damage, paralysis, failed block, inadequate analgesia, allergic reaction, high spinal, itching and headache were discussed and the patient wished to proceed.   Dawn Mago, MD     

## 2015-08-09 LAB — CBC
HCT: 33.1 % — ABNORMAL LOW (ref 36.0–46.0)
Hemoglobin: 11.2 g/dL — ABNORMAL LOW (ref 12.0–15.0)
MCH: 30.2 pg (ref 26.0–34.0)
MCHC: 33.8 g/dL (ref 30.0–36.0)
MCV: 89.2 fL (ref 78.0–100.0)
Platelets: 241 10*3/uL (ref 150–400)
RBC: 3.71 MIL/uL — ABNORMAL LOW (ref 3.87–5.11)
RDW: 12.7 % (ref 11.5–15.5)
WBC: 9.3 10*3/uL (ref 4.0–10.5)

## 2015-08-09 LAB — BASIC METABOLIC PANEL
Anion gap: 9 (ref 5–15)
BUN: 11 mg/dL (ref 6–20)
CHLORIDE: 103 mmol/L (ref 101–111)
CO2: 24 mmol/L (ref 22–32)
CREATININE: 0.65 mg/dL (ref 0.44–1.00)
Calcium: 8.9 mg/dL (ref 8.9–10.3)
GFR calc Af Amer: >60 mL/min (ref >60)
GFR calc non Af Amer: >60 mL/min (ref >60)
GLUCOSE: 185 mg/dL — AB (ref 65–99)
POTASSIUM: 3.9 mmol/L (ref 3.5–5.1)
SODIUM: 136 mmol/L (ref 135–145)

## 2015-08-09 MED ORDER — TRAMADOL HCL 50 MG PO TABS: 50.0000 mg | ORAL_TABLET | Freq: Four times a day (QID) | ORAL | Status: DC | PRN

## 2015-08-09 MED ORDER — HYDROMORPHONE HCL 2 MG PO TABS: 2.0000 mg | ORAL_TABLET | ORAL | Status: DC | PRN

## 2015-08-09 MED ORDER — PROMETHAZINE HCL 25 MG PO TABS: 12.5000 mg | ORAL_TABLET | Freq: Four times a day (QID) | ORAL | Status: DC | PRN

## 2015-08-09 MED ORDER — PROMETHAZINE HCL 25 MG/ML IJ SOLN
6.2500 mg | Freq: Four times a day (QID) | INTRAMUSCULAR | Status: DC | PRN
  Administered 2015-08-09: 6.25 mg via INTRAVENOUS
  Filled 2015-08-09 (×2): qty 1

## 2015-08-09 MED ORDER — PROMETHAZINE HCL 25 MG RE SUPP: 25.0000 mg | Freq: Four times a day (QID) | RECTAL | Status: DC | PRN

## 2015-08-09 MED ORDER — APIXABAN 2.5 MG PO TABS: 2.5000 mg | ORAL_TABLET | Freq: Two times a day (BID) | ORAL | Status: DC

## 2015-08-09 MED ORDER — METHOCARBAMOL 500 MG PO TABS: 500.0000 mg | ORAL_TABLET | Freq: Four times a day (QID) | ORAL | Status: DC | PRN

## 2015-08-09 MED ORDER — PROMETHAZINE HCL 12.5 MG PO TABS: 12.5000 mg | ORAL_TABLET | Freq: Four times a day (QID) | ORAL | Status: DC | PRN

## 2015-08-09 NOTE — Progress Notes (Signed)
Physical Therapy Treatment Patient Details Name: Li Bobo MRN: [id number] DOB: 1946-05-22 Today's Date: 08/09/2015    History of Present Illness L tka , post op N/V    PT Comments    Patient with N/V after getting into recliner. Assisted back to bed. RN gave meds.   Follow Up Recommendations        Equipment Recommendations  Rolling walker with 5" wheels    Recommendations for Other Services       Precautions / Restrictions Precautions Precautions: Knee Required Braces or Orthoses: Knee Immobilizer - Left Knee Immobilizer - Left: Discontinue once straight leg raise with < 10 degree lag    Mobility  Bed Mobility Overal bed mobility: Needs Assistance Bed Mobility: Sit to Supine       Sit to supine: Min assist   General bed mobility comments: assist with :left leg, patient very nauseated and wanted to return to bed.  Transfers Overall transfer level: Needs assistance Equipment used: Rolling walker (2 wheeled) Transfers: Sit to/from [facility] Sit to Stand: Min assist         General transfer comment: cues for safety and hand placement, min assist for posture and safety.  Ambulation/Gait                 Stairs            Wheelchair Mobility    Modified Rankin (Stroke Patients Only)       Balance                                    Cognition Arousal/Alertness: Awake/alert Behavior During Therapy: Anxious (N/V)                        Exercises      General Comments        Pertinent Vitals/Pain Pain Score: 7  Pain Location: L knee Pain Descriptors / Indicators: Aching Pain Intervention(s): Limited activity within patient's tolerance;Ice applied    Home Living                      Prior Function            PT Goals (current goals can now be found in the care plan section) Progress towards PT goals: Progressing toward goals    Frequency  7X/week    PT Plan  Current plan remains appropriate    Co-evaluation             End of Session Equipment Utilized During Treatment: Left knee immobilizer Activity Tolerance: Patient limited by fatigue;Patient limited by pain Patient left: in bed;with call bell/phone within reach     Time: 5885-0277 PT Time Calculation (min) (ACUTE ONLY): 12 min  Charges:  $Therapeutic Activity: 8-22 mins                    G Codes:      Claretha Cooper 08/09/2015, 3:58 PM Tresa Endo PT [phone]

## 2015-08-09 NOTE — Discharge Instructions (Addendum)
° °Dr. Frank Aluisio °Total Joint Specialist °[facility] °[address] °[place], [place] [address] °[phone] ° °TOTAL KNEE REPLACEMENT POSTOPERATIVE DIRECTIONS ° °Knee Rehabilitation, Guidelines Following Surgery  °Results after knee surgery are often greatly improved when you follow the exercise, range of motion and muscle strengthening exercises prescribed by your doctor. Safety measures are also important to protect the knee from further injury. Any time any of these exercises cause you to have increased pain or swelling in your knee joint, decrease the amount until you are comfortable again and slowly increase them. If you have problems or questions, call your caregiver or physical therapist for advice.  ° °HOME CARE INSTRUCTIONS  °Remove items at home which could result in a fall. This includes throw rugs or furniture in walking pathways.  °· ICE to the affected knee every three hours for 30 minutes at a time and then as needed for pain and swelling.  Continue to use ice on the knee for pain and swelling from surgery. You may notice swelling that will progress down to the foot and ankle.  This is normal after surgery.  Elevate the leg when you are not up walking on it.   °· Continue to use the breathing machine which will help keep your temperature down.  It is common for your temperature to cycle up and down following surgery, especially at night when you are not up moving around and exerting yourself.  The breathing machine keeps your lungs expanded and your temperature down. °· Do not place pillow under knee, focus on keeping the knee straight while resting ° °DIET °You may resume your previous home diet once your are discharged from the hospital. ° °DRESSING / WOUND CARE / SHOWERING °You may shower 3 days after surgery, but keep the wounds dry during showering.  You may use an occlusive plastic wrap (Press'n Seal for example), NO SOAKING/SUBMERGING IN THE BATHTUB.  If the  bandage gets wet, change with a clean dry gauze.  If the incision gets wet, pat the wound dry with a clean towel. °You may start showering once you are discharged home but do not submerge the incision under water. Just pat the incision dry and apply a dry gauze dressing on daily. °Change the surgical dressing daily and reapply a dry dressing each time. ° °ACTIVITY °Walk with your walker as instructed. °Use walker as long as suggested by your caregivers. °Avoid periods of inactivity such as sitting longer than an hour when not asleep. This helps prevent blood clots.  °You may resume a sexual relationship in one month or when given the OK by your doctor.  °You may return to work once you are cleared by your doctor.  °Do not drive a car for 6 weeks or until released by you surgeon.  °Do not drive while taking narcotics. ° °WEIGHT BEARING °Weight bearing as tolerated with assist device (walker, cane, etc) as directed, use it as long as suggested by your surgeon or therapist, typically at least 4-6 weeks. ° °POSTOPERATIVE CONSTIPATION PROTOCOL °Constipation - defined medically as fewer than three stools per week and severe constipation as less than one stool per week. ° °One of the most common issues patients have following surgery is constipation.  Even if you have a regular bowel pattern at home, your normal regimen is likely to be disrupted due to multiple reasons following surgery.  Combination of anesthesia, postoperative narcotics, change in appetite and fluid intake all can affect your bowels.    In order to avoid complications following surgery, here are some recommendations in order to help you during your recovery period. ° °Colace (docusate) - Pick up an over-the-counter form of Colace or another stool softener and take twice a day as long as you are requiring postoperative pain medications.  Take with a full glass of water daily.  If you experience loose stools or diarrhea, hold the colace until you stool forms  back up.  If your symptoms do not get better within 1 week or if they get worse, check with your doctor. ° °Dulcolax (bisacodyl) - Pick up over-the-counter and take as directed by the product packaging as needed to assist with the movement of your bowels.  Take with a full glass of water.  Use this product as needed if not relieved by Colace only.  ° °MiraLax (polyethylene glycol) - Pick up over-the-counter to have on hand.  MiraLax is a solution that will increase the amount of water in your bowels to assist with bowel movements.  Take as directed and can mix with a glass of water, juice, soda, coffee, or tea.  Take if you go more than two days without a movement. °Do not use MiraLax more than once per day. Call your doctor if you are still constipated or irregular after using this medication for 7 days in a row. ° °If you continue to have problems with postoperative constipation, please contact the office for further assistance and recommendations.  If you experience "the worst abdominal pain ever" or develop nausea or vomiting, please contact the office immediatly for further recommendations for treatment. ° °ITCHING ° If you experience itching with your medications, try taking only a single pain pill, or even half a pain pill at a time.  You can also use Benadryl over the counter for itching or also to help with sleep.  ° °TED HOSE STOCKINGS °Wear the elastic stockings on both legs for three weeks following surgery during the day but you may remove then at night for sleeping. ° °MEDICATIONS °See your medication summary on the “After Visit Summary” that the nursing staff will review with you prior to discharge.  You may have some home medications which will be placed on hold until you complete the course of blood thinner medication.  It is important for you to complete the blood thinner medication as prescribed by your surgeon.  Continue your approved medications as instructed at time of  discharge. ° °PRECAUTIONS °If you experience chest pain or shortness of breath - call 911 immediately for transfer to the hospital emergency department.  °If you develop a fever greater that 101 F, purulent drainage from wound, increased redness or drainage from wound, foul odor from the wound/dressing, or calf pain - CONTACT YOUR SURGEON.   °                                                °FOLLOW-UP APPOINTMENTS °Make sure you keep all of your appointments after your operation with your surgeon and caregivers. You should call the office at the above phone number and make an appointment for approximately two weeks after the date of your surgery or on the date instructed by your surgeon outlined in the "After Visit Summary". ° ° °RANGE OF MOTION AND STRENGTHENING EXERCISES  °Rehabilitation of the knee is important following a knee injury or   an operation. After just a few days of immobilization, the muscles of the thigh which control the knee become weakened and shrink (atrophy). Knee exercises are designed to build up the tone and strength of the thigh muscles and to improve knee motion. Often times heat used for twenty to thirty minutes before working out will loosen up your tissues and help with improving the range of motion but do not use heat for the first two weeks following surgery. These exercises can be done on a training (exercise) mat, on the floor, on a table or on a bed. Use what ever works the best and is most comfortable for you Knee exercises include:  Leg Lifts - While your knee is still immobilized in a splint or cast, you can do straight leg raises. Lift the leg to 60 degrees, hold for 3 sec, and slowly lower the leg. Repeat 10-20 times 2-3 times daily. Perform this exercise against resistance later as your knee gets better.  Quad and Hamstring Sets - Tighten up the muscle on the front of the thigh (Quad) and hold for 5-10 sec. Repeat this 10-20 times hourly. Hamstring sets are done by pushing the  foot backward against an object and holding for 5-10 sec. Repeat as with quad sets.   Leg Slides: Lying on your back, slowly slide your foot toward your buttocks, bending your knee up off the floor (only go as far as is comfortable). Then slowly slide your foot back down until your leg is flat on the floor again.  Angel Wings: Lying on your back spread your legs to the side as far apart as you can without causing discomfort.  A rehabilitation program following serious knee injuries can speed recovery and prevent re-injury in the future due to weakened muscles. Contact your doctor or a physical therapist for more information on knee rehabilitation.   IF YOU ARE TRANSFERRED TO A SKILLED REHAB FACILITY If the patient is transferred to a skilled rehab facility following release from the hospital, a list of the current medications will be sent to the facility for the patient to continue.  When discharged from the skilled rehab facility, please have the facility set up the patient's [place] prior to being released. Also, the skilled facility will be responsible for providing the patient with their medications at time of release from the facility to include their pain medication, the muscle relaxants, and their blood thinner medication. If the patient is still at the rehab facility at time of the two week follow up appointment, the skilled rehab facility will also need to assist the patient in arranging follow up appointment in our office and any transportation needs.  MAKE SURE YOU:  Understand these instructions.  Get help right away if you are not doing well or get worse.    Pick up stool softner and laxative for home use following surgery while on pain medications. Do not submerge incision under water. Please use good hand washing techniques while changing dressing each day. May shower starting three days after surgery. Please use a clean towel to pat the incision dry following  showers. Continue to use ice for pain and swelling after surgery. Do not use any lotions or creams on the incision until instructed by your surgeon.  Take Eliquis twice a day for two and a half more weeks, then discontinue Eliquis. Once the patient has completed the blood thinner regimen, then take a Baby 81 mg Aspirin daily for three more weeks.  Information on my medicine - ELIQUIS (apixaban)  This medication education was reviewed with me or my healthcare representative as part of my discharge preparation.  The pharmacist that spoke with me during my hospital stay was:  Kara Mead, [facility]  Why was Eliquis prescribed for you? Eliquis was prescribed for you to reduce the risk of blood clots forming after orthopedic surgery.    What do You need to know about Eliquis? Take your Eliquis TWICE DAILY - one tablet in the morning and one tablet in the evening with or without food.  It would be best to take the dose about the same time each day.  If you have difficulty swallowing the tablet whole please discuss with your pharmacist how to take the medication safely.  Take Eliquis exactly as prescribed by your doctor and DO NOT stop taking Eliquis without talking to the doctor who prescribed the medication.  Stopping without other medication to take the place of Eliquis may increase your risk of developing a clot.  After discharge, you should have regular check-up appointments with your healthcare provider that is prescribing your Eliquis.  What do you do if you miss a dose? If a dose of ELIQUIS is not taken at the scheduled time, take it as soon as possible on the same day and twice-daily administration should be resumed.  The dose should not be doubled to make up for a missed dose.  Do not take more than one tablet of ELIQUIS at the same time.  Important Safety Information A possible side effect of Eliquis is bleeding. You should call your healthcare provider right away if  you experience any of the following: ? Bleeding from an injury or your nose that does not stop. ? Unusual colored urine (red or dark brown) or unusual colored stools (red or black). ? Unusual bruising for unknown reasons. ? A serious fall or if you hit your head (even if there is no bleeding).  Some medicines may interact with Eliquis and might increase your risk of bleeding or clotting while on Eliquis. To help avoid this, consult your healthcare provider or pharmacist prior to using any new prescription or non-prescription medications, including herbals, vitamins, non-steroidal anti-inflammatory drugs (NSAIDs) and supplements.  This website has more information on Eliquis (apixaban): [email or web address]

## 2015-08-09 NOTE — Discharge Summary (Signed)
Physician Discharge Summary   Patient ID: Dawn Collins MRN: [id number] DOB/AGE: 03-01-1946 70 y.o.  Admit date: 08/08/2015 Discharge date: 08-11-2015  Primary Diagnosis:  Osteoarthritis Left knee(s)  Admission Diagnoses:  Past Medical History  Diagnosis Date  . Hypertension   . Asthma     2 weeks cold,no problems now-well controlled  . GERD (gastroesophageal reflux disease)     tx. pantoprazole  . Diverticulitis of colon 05-28-11    no current problems  . Arthritis     knees, back.  . Bursitis, knee     Bil. Hips, not knee  . Complication of anesthesia     slow to wake up  . PONV (postoperative nausea and vomiting)   . Multiple thyroid nodules   . Sleep apnea     No cpap now -3 months last due to insurance reasons   Discharge Diagnoses:   Principal Problem:   OA (osteoarthritis) of knee  Estimated body mass index is 29.44 kg/(m^2) as calculated from the following:   Height as of this encounter: 5' 5.75" (1.67 m).   Weight as of this encounter: 82.101 kg (181 lb).  Procedure:  Procedure(s) (LRB): TOTAL LEFT KNEE ARTHROPLASTY (Left)   Consults: None  HPI: [name] is a 70 y.o. year old female with end stage OA of her left knee with progressively worsening pain and dysfunction. She has constant pain, with activity and at rest and significant functional deficits with difficulties even with ADLs. She has had extensive non-op management including analgesics, injections of cortisone and viscosupplements, and home exercise program, but remains in significant pain with significant dysfunction. Radiographs show bone on bone arthritis lateral and patellofemoral with significant valgus deformity. She presents now for left Total Knee Arthroplasty.   Laboratory Data: Admission on 08/08/2015  Component Date Value Ref Range Status  . WBC 08/09/2015 9.3  4.0 - 10.5 K/uL Final  . RBC 08/09/2015 3.71* 3.87 - 5.11 MIL/uL Final  . Hemoglobin 08/09/2015 11.2* 12.0 - 15.0 g/dL  Final  . HCT 08/09/2015 33.1* 36.0 - 46.0 % Final  . MCV 08/09/2015 89.2  78.0 - 100.0 fL Final  . MCH 08/09/2015 30.2  26.0 - 34.0 pg Final  . MCHC 08/09/2015 33.8  30.0 - 36.0 g/dL Final  . RDW 08/09/2015 12.7  11.5 - 15.5 % Final  . Platelets 08/09/2015 241  150 - 400 K/uL Final  . Sodium 08/09/2015 136  135 - 145 mmol/L Final  . Potassium 08/09/2015 3.9  3.5 - 5.1 mmol/L Final  . Chloride 08/09/2015 103  101 - 111 mmol/L Final  . CO2 08/09/2015 24  22 - 32 mmol/L Final  . Glucose, Bld 08/09/2015 185* 65 - 99 mg/dL Final  . BUN 08/09/2015 11  6 - 20 mg/dL Final  . Creatinine, Ser 08/09/2015 0.65  0.44 - 1.00 mg/dL Final  . Calcium 08/09/2015 8.9  8.9 - 10.3 mg/dL Final  . GFR calc non Af Amer 08/09/2015 >60  >60 mL/min Final  . GFR calc Af Amer 08/09/2015 >60  >60 mL/min Final   Comment: (NOTE) The eGFR has been calculated using the CKD EPI equation. This calculation has not been validated in all clinical situations. eGFR's persistently <60 mL/min signify possible Chronic Kidney Disease.   Georgiann Hahn gap 08/09/2015 9  5 - 15 Final  Hospital Outpatient Visit on 08/02/2015  Component Date Value Ref Range Status  . aPTT 08/02/2015 27  24 - 37 seconds Final  . WBC 08/02/2015 8.5  4.0 -  10.5 K/uL Final  . RBC 08/02/2015 4.50  3.87 - 5.11 MIL/uL Final  . Hemoglobin 08/02/2015 13.3  12.0 - 15.0 g/dL Final  . HCT 02/43/8506 40.0  36.0 - 46.0 % Final  . MCV 08/02/2015 88.9  78.0 - 100.0 fL Final  . MCH 08/02/2015 29.6  26.0 - 34.0 pg Final  . MCHC 08/02/2015 33.3  30.0 - 36.0 g/dL Final  . RDW 80/52/4501 12.9  11.5 - 15.5 % Final  . Platelets 08/02/2015 246  150 - 400 K/uL Final  . Sodium 08/02/2015 137  135 - 145 mmol/L Final  . Potassium 08/02/2015 3.8  3.5 - 5.1 mmol/L Final  . Chloride 08/02/2015 101  101 - 111 mmol/L Final  . CO2 08/02/2015 27  22 - 32 mmol/L Final  . Glucose, Bld 08/02/2015 104* 65 - 99 mg/dL Final  . BUN 61/88/6783 21* 6 - 20 mg/dL Final  . Creatinine, Ser  08/02/2015 0.98  0.44 - 1.00 mg/dL Final  . Calcium 78/09/5165 9.3  8.9 - 10.3 mg/dL Final  . Total Protein 08/02/2015 7.1  6.5 - 8.1 g/dL Final  . Albumin 99/48/7097 3.9  3.5 - 5.0 g/dL Final  . AST 48/32/8722 18  15 - 41 U/L Final  . ALT 08/02/2015 15  14 - 54 U/L Final  . Alkaline Phosphatase 08/02/2015 49  38 - 126 U/L Final  . Total Bilirubin 08/02/2015 0.8  0.3 - 1.2 mg/dL Final  . GFR calc non Af Amer 08/02/2015 57* >60 mL/min Final  . GFR calc Af Amer 08/02/2015 >60  >60 mL/min Final   Comment: (NOTE) The eGFR has been calculated using the CKD EPI equation. This calculation has not been validated in all clinical situations. eGFR's persistently <60 mL/min signify possible Chronic Kidney Disease.   . Anion gap 08/02/2015 9  5 - 15 Final  . Prothrombin Time 08/02/2015 14.0  11.6 - 15.2 seconds Final  . INR 08/02/2015 1.06  0.00 - 1.49 Final  . ABO/RH(D) 08/02/2015 AB POS   Final  . Antibody Screen 08/02/2015 NEG   Final  . Sample Expiration 08/02/2015 08/16/2015   Final  . Extend sample reason 08/02/2015 NO TRANSFUSIONS OR PREGNANCY IN THE PAST 3 MONTHS   Final  . Color, Urine 08/02/2015 YELLOW  YELLOW Final  . APPearance 08/02/2015 CLEAR  CLEAR Final  . Specific Gravity, Urine 08/02/2015 1.024  1.005 - 1.030 Final  . pH 08/02/2015 7.5  5.0 - 8.0 Final  . Glucose, UA 08/02/2015 NEGATIVE  NEGATIVE mg/dL Final  . Hgb urine dipstick 08/02/2015 NEGATIVE  NEGATIVE Final  . Bilirubin Urine 08/02/2015 NEGATIVE  NEGATIVE Final  . Ketones, ur 08/02/2015 NEGATIVE  NEGATIVE mg/dL Final  . Protein, ur 44/14/7384 NEGATIVE  NEGATIVE mg/dL Final  . Nitrite 71/68/5342 NEGATIVE  NEGATIVE Final  . Leukocytes, UA 08/02/2015 NEGATIVE  NEGATIVE Final   MICROSCOPIC NOT DONE ON URINES WITH NEGATIVE PROTEIN, BLOOD, LEUKOCYTES, NITRITE, OR GLUCOSE <1000 mg/dL.  Marland Kitchen MRSA, PCR 08/02/2015 NEGATIVE  NEGATIVE Final  . Staphylococcus aureus 08/02/2015 NEGATIVE  NEGATIVE Final   Comment:        The Xpert SA  Assay (FDA approved for NASAL specimens in patients over 27 years of age), is one component of a comprehensive surveillance program.  Test performance has been validated by [facility] for patients greater than or equal to 44 year old. It is not intended to diagnose infection nor to guide or monitor treatment.      X-Rays:No results found.  EKG: Orders placed or performed during the hospital encounter of 06/24/11  . EKG     Hospital Course: Brittny Spangle is a 70 y.o. who was admitted to [facility]. They were brought to the operating room on 08/08/2015 and underwent Procedure(s): TOTAL LEFT KNEE ARTHROPLASTY.  Patient tolerated the procedure well and was later transferred to the recovery room and then to the orthopaedic floor for postoperative care.  They were given PO and IV analgesics for pain control following their surgery.  They were given 24 hours of postoperative antibiotics of  Anti-infectives    Start     Dose/Rate Route Frequency Ordered Stop   08/09/15 0100  vancomycin (VANCOCIN) IVPB 1000 mg/200 mL premix     1,000 mg 200 mL/hr over 60 Minutes Intravenous Every 12 hours 08/08/15 1608 08/09/15 0143   08/08/15 1045  ceFAZolin (ANCEF) IVPB 2 g/50 mL premix     2 g 100 mL/hr over 30 Minutes Intravenous On call to O.R. 08/08/15 1024 08/08/15 1320     and started on DVT prophylaxis in the form of Eliquis.   PT and OT were ordered for total joint protocol.  Discharge planning consulted to help with postop disposition and equipment needs.  Patient had a tough night on the evening of surgery due to nausea.  They started to get up OOB with therapy on day one. Hemovac drain was pulled without difficulty.  Continued to work with therapy into day two.  Dressing was changed on day two and the incision was healing well.  By day three, the patient had progressed with therapy and meeting their goals.  Incision was healing well.  Patient was seen in rounds, doing much better, and  was ready to go home.  Discharge home with home health Diet - Cardiac diet Follow up - in 2 weeks Activity - WBAT Disposition - Home Condition Upon Discharge - Good D/C Meds - See DC Summary DVT Prophylaxis - Eliquis  Discharge Instructions    Call MD / Call 911    Complete by:  As directed   If you experience chest pain or shortness of breath, CALL 911 and be transported to the hospital emergency room.  If you develope a fever above 101 F, pus (white drainage) or increased drainage or redness at the wound, or calf pain, call your surgeon's office.     Change dressing    Complete by:  As directed   Change dressing daily with sterile 4 x 4 inch gauze dressing and apply TED hose. Do not submerge the incision under water.     Constipation Prevention    Complete by:  As directed   Drink plenty of fluids.  Prune juice may be helpful.  You may use a stool softener, such as Colace (over the counter) 100 mg twice a day.  Use MiraLax (over the counter) for constipation as needed.     Diet - low sodium heart healthy    Complete by:  As directed      Discharge instructions    Complete by:  As directed   Pick up stool softner and laxative for home use following surgery while on pain medications. Do not submerge incision under water. Please use good hand washing techniques while changing dressing each day. May shower starting three days after surgery. Please use a clean towel to pat the incision dry following showers. Continue to use ice for pain and swelling after surgery. Do not use any lotions or creams on  the incision until instructed by your surgeon.  Take Eliquis twice a day for two and a half more weeks, then discontinue the Eliquis. Once the patient has completed the blood thinner regimen, then take a Baby 81 mg Aspirin daily for three more weeks.  Postoperative Constipation Protocol  Constipation - defined medically as fewer than three stools per week and severe constipation as less  than one stool per week.  One of the most common issues patients have following surgery is constipation.  Even if you have a regular bowel pattern at home, your normal regimen is likely to be disrupted due to multiple reasons following surgery.  Combination of anesthesia, postoperative narcotics, change in appetite and fluid intake all can affect your bowels.  In order to avoid complications following surgery, here are some recommendations in order to help you during your recovery period.  Colace (docusate) - Pick up an over-the-counter form of Colace or another stool softener and take twice a day as long as you are requiring postoperative pain medications.  Take with a full glass of water daily.  If you experience loose stools or diarrhea, hold the colace until you stool forms back up.  If your symptoms do not get better within 1 week or if they get worse, check with your doctor.  Dulcolax (bisacodyl) - Pick up over-the-counter and take as directed by the product packaging as needed to assist with the movement of your bowels.  Take with a full glass of water.  Use this product as needed if not relieved by Colace only.   MiraLax (polyethylene glycol) - Pick up over-the-counter to have on hand.  MiraLax is a solution that will increase the amount of water in your bowels to assist with bowel movements.  Take as directed and can mix with a glass of water, juice, soda, coffee, or tea.  Take if you go more than two days without a movement. Do not use MiraLax more than once per day. Call your doctor if you are still constipated or irregular after using this medication for 7 days in a row.  If you continue to have problems with postoperative constipation, please contact the office for further assistance and recommendations.  If you experience "the worst abdominal pain ever" or develop nausea or vomiting, please contact the office immediatly for further recommendations for treatment.     Do not put a pillow  under the knee. Place it under the heel.    Complete by:  As directed      Do not sit on low chairs, stoools or toilet seats, as it may be difficult to get up from low surfaces    Complete by:  As directed      Driving restrictions    Complete by:  As directed   No driving until released by the physician.     Increase activity slowly as tolerated    Complete by:  As directed      Lifting restrictions    Complete by:  As directed   No lifting until released by the physician.     Patient may shower    Complete by:  As directed   You may shower without a dressing once there is no drainage.  Do not wash over the wound.  If drainage remains, do not shower until drainage stops.     TED hose    Complete by:  As directed   Use stockings (TED hose) for 3 weeks on both leg(s).  You  may remove them at night for sleeping.     Weight bearing as tolerated    Complete by:  As directed   Laterality:  left  Extremity:  Lower            Medication List    STOP taking these medications        estradiol 2 MG tablet  Commonly known as:  ESTRACE     meloxicam 7.5 MG tablet  Commonly known as:  MOBIC     SALONPAS Pads      TAKE these medications        acetaminophen 500 MG tablet  Commonly known as:  TYLENOL  Take 500-1,000 mg by mouth every 6 (six) hours as needed. For arthritis     amiloride-hydrochlorothiazide 5-50 MG tablet  Commonly known as:  MODURETIC  Take 1 tablet by mouth daily.     apixaban 2.5 MG Tabs tablet  Commonly known as:  ELIQUIS  Take 1 tablet (2.5 mg total) by mouth every 12 (twelve) hours. Take Eliquis twice a day for two and a half more weeks, then discontinue the Eliquis. Once the patient has completed the blood thinner regimen, then take a Baby 81 mg Aspirin daily for three more weeks.     cetirizine 10 MG tablet  Commonly known as:  ZYRTEC  Take 10 mg by mouth at bedtime as needed for allergies.     diltiazem 300 MG 24 hr capsule  Commonly known as:   CARDIZEM CD  Take 300 mg by mouth daily.     gabapentin 300 MG capsule  Commonly known as:  NEURONTIN  Take 300 mg by mouth daily as needed. pain     HYDROmorphone 2 MG tablet  Commonly known as:  DILAUDID  Take 1-2 tablets (2-4 mg total) by mouth every 4 (four) hours as needed for moderate pain or severe pain.     latanoprost 0.005 % ophthalmic solution  Commonly known as:  XALATAN  Place 1 drop into both eyes at bedtime.     losartan 50 MG tablet  Commonly known as:  COZAAR  Take 50 mg by mouth daily.     methocarbamol 500 MG tablet  Commonly known as:  ROBAXIN  Take 1 tablet (500 mg total) by mouth every 6 (six) hours as needed for muscle spasms.     pantoprazole 40 MG tablet  Commonly known as:  PROTONIX  Take 40 mg by mouth daily.     promethazine 12.5 MG tablet  Commonly known as:  PHENERGAN  Take 1 tablet (12.5 mg total) by mouth every 6 (six) hours as needed for nausea.     traMADol 50 MG tablet  Commonly known as:  ULTRAM  Take 1-2 tablets (50-100 mg total) by mouth every 6 (six) hours as needed (mild pain).           Follow-up Information    Follow up with Gearlean Alf, MD On 08/23/2015.   Specialty:  Orthopedic Surgery   Why:  Call office at [phone] to setup appointment on Tuesday 08/23/2015.   Contact information:   [address] [place] [address] [phone]       Signed: Arlee Muslim, PA-C Orthopaedic Surgery 08/09/2015, 11:19 PM

## 2015-08-09 NOTE — Evaluation (Signed)
Physical Therapy Evaluation Patient Details Name: Dawn Collins MRN: [id number] DOB: 1946-04-20 Today's Date: 08/09/2015   History of Present Illness  L tka , post op N/V  Clinical Impression  Patient limited by pain and N/V. Patient will benefit from PT to address problems listed in the note below.    Follow Up Recommendations Home health PT;Supervision/Assistance - 24 hour    Equipment Recommendations  Rolling walker with 5" wheels    Recommendations for Other Services       Precautions / Restrictions Precautions Precautions: Knee Required Braces or Orthoses: Knee Immobilizer - Left Knee Immobilizer - Left: Discontinue once straight leg raise with < 10 degree lag Restrictions Weight Bearing Restrictions: No      Mobility  Bed Mobility Overal bed mobility: Needs Assistance Bed Mobility: Supine to Sit;Sit to Supine     Supine to sit: Min assist Sit to supine: Min assist   General bed mobility comments: assist with :left leg  Transfers Overall transfer level: Needs assistance Equipment used: Rolling walker (2 wheeled) Transfers: Sit to/from Stand Sit to Stand: Min assist         General transfer comment: cues for safety and hand placement  Ambulation/Gait Ambulation/Gait assistance: Min assist Ambulation Distance (Feet): 20 Feet Assistive device: Rolling walker (2 wheeled) Gait Pattern/deviations: Step-to pattern;Trunk flexed     General Gait Details: cues for posture  Stairs            Wheelchair Mobility    Modified Rankin (Stroke Patients Only)       Balance                                             Pertinent Vitals/Pain Pain Assessment: 0-10 Pain Score: 7  Pain Location: L knee Pain Descriptors / Indicators: Aching Pain Intervention(s): Limited activity within patient's tolerance;Monitored during session;Ice applied;Premedicated before session;Repositioned    Home Living Family/patient expects to be  discharged to:: Private residence Living Arrangements: Spouse/significant other;Other relatives Available Help at Discharge: Family;Available 24 hours/day Type of Home: House Home Access: Stairs to enter Entrance Stairs-Rails: None Entrance Stairs-Number of Steps: 1 Home Layout: One level Home Equipment: None      Prior Function Level of Independence: Independent               Hand Dominance        Extremity/Trunk Assessment   Upper Extremity Assessment: Overall WFL for tasks assessed           Lower Extremity Assessment: LLE deficits/detail   LLE Deficits / Details: requires assist for  lr=eg  off bed.  Cervical / Trunk Assessment: Normal  Communication   Communication: No difficulties  Cognition Arousal/Alertness: Awake/alert Behavior During Therapy: WFL for tasks assessed/performed Overall Cognitive Status: Within Functional Limits for tasks assessed                      General Comments      Exercises        Assessment/Plan    PT Assessment Patient needs continued PT services  PT Diagnosis Difficulty walking;Acute pain   PT Problem List Decreased strength;Decreased range of motion;Decreased activity tolerance;Decreased mobility;Decreased knowledge of precautions;Decreased safety awareness;Pain  PT Treatment Interventions DME instruction;Gait training;Stair training;Functional mobility training;Therapeutic activities;Patient/family education   PT Goals (Current goals can be found in the Care Plan section) Acute Rehab PT Goals Patient  Stated Goal: to feel better so I can go home PT Goal Formulation: With patient Time For Goal Achievement: 08/13/15 Potential to Achieve Goals: Good    Frequency 7X/week   Barriers to discharge        Co-evaluation               End of Session Equipment Utilized During Treatment: Left knee immobilizer Activity Tolerance: Treatment limited secondary to agitation (nausea) Patient left: in  chair;with call bell/phone within reach Nurse Communication: Mobility status;Patient requests pain meds         Time: 5051-8335 PT Time Calculation (min) (ACUTE ONLY): 30 min   Charges:   PT Evaluation $PT Eval Low Complexity: 1 Procedure PT Treatments $Gait Training: 8-22 mins   PT G Codes:        Claretha Cooper 08/09/2015, 9:52 AM Tresa Endo PT [phone]

## 2015-08-09 NOTE — Progress Notes (Signed)
OT Cancellation Note  Patient Details Name: Dawn Collins MRN: [id number] DOB: [birth year]   Cancelled Treatment:    Reason Eval/Treat Not Completed: Other (comment).  Pt had vomiting earlier and medication has her sleeping soundly. Will check back in the am  Juhi Lagrange 08/09/2015, 1:02 PM  Lesle Chris, OTR/L [phone] 08/09/2015

## 2015-08-09 NOTE — Progress Notes (Signed)
   Subjective: 1 Day Post-Op Procedure(s) (LRB): TOTAL LEFT KNEE ARTHROPLASTY (Left) Patient reports pain as mild.  Had a tough night. Patient seen in rounds with Dr. Wynelle Link.  Biggest complaint is nausea thru the night. Patient is having problems with nausea/vomiting We will start therapy today.  Plan is to go Home after hospital stay.  Objective: Vital signs in last 24 hours: Temp:  [97.4 F (36.3 C)-98.6 F (37 C)] 98.6 F (37 C) (01/17 0437) Pulse Rate:  [51-75] 71 (01/17 0437) Resp:  [14-19] 17 (01/17 0437) BP: (101-162)/(47-108) 131/53 mmHg (01/17 0437) SpO2:  [98 %-100 %] 100 % (01/17 0608) Weight:  [82.101 kg (181 lb)] 82.101 kg (181 lb) (01/16 1600)  Intake/Output from previous day:  Intake/Output Summary (Last 24 hours) at 08/09/15 0908 Last data filed at 08/09/15 0811  Gross per 24 hour  Intake   3715 ml  Output   1385 ml  Net   2330 ml    Intake/Output this shift: Total I/O In: 240 [P.O.:240] Out: -   Labs:  Recent Labs  08/09/15 0424  HGB 11.2*    Recent Labs  08/09/15 0424  WBC 9.3  RBC 3.71*  HCT 33.1*  PLT 241    Recent Labs  08/09/15 0424  NA 136  K 3.9  CL 103  CO2 24  BUN 11  CREATININE 0.65  GLUCOSE 185*  CALCIUM 8.9   No results for input(s): LABPT, INR in the last 72 hours.  EXAM General - Patient is Alert, Appropriate and Oriented Extremity - Neurovascular intact Sensation intact distally Dressing - dressing C/D/I Motor Function - intact, moving foot and toes well on exam.  Hemovac pulled without difficulty.  Past Medical History  Diagnosis Date  . Hypertension   . Asthma     2 weeks cold,no problems now-well controlled  . GERD (gastroesophageal reflux disease)     tx. pantoprazole  . Diverticulitis of colon 05-28-11    no current problems  . Arthritis     knees, back.  . Bursitis, knee     Bil. Hips, not knee  . Complication of anesthesia     slow to wake up  . PONV (postoperative nausea and vomiting)     . Multiple thyroid nodules   . Sleep apnea     No cpap now -3 months last due to insurance reasons    Assessment/Plan: 1 Day Post-Op Procedure(s) (LRB): TOTAL LEFT KNEE ARTHROPLASTY (Left) Principal Problem:   OA (osteoarthritis) of knee  Estimated body mass index is 29.44 kg/(m^2) as calculated from the following:   Height as of this encounter: 5' 5.75" (1.67 m).   Weight as of this encounter: 82.101 kg (181 lb). Advance diet as tolaerated Up with therapy Plan for discharge tomorrow Discharge home with sister with home health if does well today  DVT Prophylaxis - Eliquis Weight-Bearing as tolerated to left leg D/C O2 and Pulse OX and try on [place], PA-C Orthopaedic Surgery 08/09/2015, 9:08 AM

## 2015-08-10 LAB — BASIC METABOLIC PANEL
Anion gap: 8 (ref 5–15)
BUN: 12 mg/dL (ref 6–20)
CHLORIDE: 101 mmol/L (ref 101–111)
CO2: 27 mmol/L (ref 22–32)
CREATININE: 0.61 mg/dL (ref 0.44–1.00)
Calcium: 8.6 mg/dL — ABNORMAL LOW (ref 8.9–10.3)
GFR calc non Af Amer: >60 mL/min (ref >60)
GLUCOSE: 109 mg/dL — AB (ref 65–99)
Potassium: 3.6 mmol/L (ref 3.5–5.1)
Sodium: 136 mmol/L (ref 135–145)

## 2015-08-10 LAB — CBC
HEMATOCRIT: 30.4 % — AB (ref 36.0–46.0)
HEMOGLOBIN: 10.4 g/dL — AB (ref 12.0–15.0)
MCH: 30.3 pg (ref 26.0–34.0)
MCHC: 34.2 g/dL (ref 30.0–36.0)
MCV: 88.6 fL (ref 78.0–100.0)
Platelets: 256 10*3/uL (ref 150–400)
RBC: 3.43 MIL/uL — ABNORMAL LOW (ref 3.87–5.11)
RDW: 12.9 % (ref 11.5–15.5)
WBC: 10.2 10*3/uL (ref 4.0–10.5)

## 2015-08-10 MED ORDER — LIP MEDEX EX OINT
TOPICAL_OINTMENT | CUTANEOUS | Status: AC
  Administered 2015-08-10: 11:00:00
  Filled 2015-08-10: qty 7

## 2015-08-10 NOTE — Care Management Note (Addendum)
Case Management Note  Patient Details  Name: Dawn Collins MRN: [id number] Date of Birth: [birth year]  Subjective/Objective:                  TOTAL LEFT KNEE ARTHROPLASTY (Left)  Action/Plan: Discharge planning Expected Discharge Date:                  Expected Discharge Plan:  [name]  In-House Referral:     Discharge planning Services  CM Consult  Post Acute Care Choice:  Home Health Choice offered to:  Patient  DME Arranged:  Walker rolling DME Agency:  [place] Arranged:  PT [facility] Agency:  [place]  Status of Service:  Completed, signed off  Medicare Important Message Given:    Date Medicare IM Given:    Medicare IM give by:    Date Additional Medicare IM Given:    Additional Medicare Important Message give by:     If discussed at [place] of Stay Meetings, dates discussed:    Additional Comments: Cm met with pt in room to offer choice of home health agency.  Pt chooses [facility] to render HHPT. Pt has elevated commode from last surgery and is need of a rolling walker only.  Referral given to [facility] rep, Tim for [place].  CM called [facility] DME rep, Lecretia to please deliver the rolling walker to room prior to discharge today. CM received callback from [place] stating pt received a walker in 2012 and insurance won't pay for another.  CM met with pt who now states, they have the rolling walker and will not need an additional walker.  No other CM needs were communicated. Dellie Catholic, RN 08/10/2015, 9:23 AM

## 2015-08-10 NOTE — Evaluation (Signed)
Occupational Therapy Evaluation Patient Details Name: Dawn Collins MRN: [id number] DOB: 1945/11/24 Today's Date: 08/10/2015    History of Present Illness L tka , post op N/V   Clinical Impression   This 70 year old female was admitted for the above surgery.  Will follow in acute for toilet transfers/activity tolerance and to further assess need for 3:1 commode.  Pt will have 24/7 assistance at home    Follow Up Recommendations  No OT follow up;Supervision/Assistance - 24 hour    Equipment Recommendations   (likely none, unless she needs 3:1 by bedside.  Pt prefers none)    Recommendations for Other Services       Precautions / Restrictions Precautions Precautions: Knee Required Braces or Orthoses: Knee Immobilizer - Left Knee Immobilizer - Left: Discontinue once straight leg raise with < 10 degree lag Restrictions Weight Bearing Restrictions: No      Mobility Bed Mobility Overal bed mobility: Needs Assistance Bed Mobility: Supine to Sit     Supine to sit: Min assist     General bed mobility comments: assist with :left leg,    Transfers   Equipment used: Rolling walker (2 wheeled) Transfers: Sit to/from Stand Sit to Stand: Min assist;From elevated surface         General transfer comment: assist to rise and steady    Balance                                            ADL Overall ADL's : Needs assistance/impaired                         Toilet Transfer: Minimal assistance;Ambulation;BSC;RW   Toileting- Clothing Manipulation and Hygiene: Minimal assistance;Sit to/from stand         General ADL Comments: pt will have sister's assistance with adls.  She can perform UB adls with set up. Pt got hot and nauseous when in bathroom; felt OK after cold cloth provided.  Brought chair into bathroom.       Vision     Perception     Praxis      Pertinent Vitals/Pain Pain Assessment: 0-10 Pain Score: 6  Pain Location:  lateral side of knee Pain Descriptors / Indicators: Aching Pain Intervention(s): Limited activity within patient's tolerance     Hand Dominance     Extremity/Trunk Assessment Upper Extremity Assessment Upper Extremity Assessment: Overall WFL for tasks assessed           Communication Communication Communication: No difficulties   Cognition Arousal/Alertness: Awake/alert Behavior During Therapy: WFL for tasks assessed/performed Overall Cognitive Status: Within Functional Limits for tasks assessed                     General Comments       Exercises       Shoulder Instructions      Home Living Family/patient expects to be discharged to:: Private residence Living Arrangements: Spouse/significant other;Other relatives Available Help at Discharge: Family;Available 24 hours/day               Bathroom Shower/Tub: Teacher, early years/pre: Handicapped height         Additional Comments: toilet is very high.  Pt will sponge bathe      Prior Functioning/Environment Level of Independence: Independent  OT Diagnosis: Acute pain   OT Problem List: Decreased activity tolerance;Decreased strength;Pain   OT Treatment/Interventions: Self-care/ADL training;DME and/or AE instruction;Patient/family education    OT Goals(Current goals can be found in the care plan section) Acute Rehab OT Goals Patient Stated Goal: to feel better so I can go home OT Goal Formulation: With patient Time For Goal Achievement: 08/17/15 Potential to Achieve Goals: Good ADL Goals Pt Will Perform Grooming: with supervision;standing Pt Will Transfer to Toilet: with min guard assist;ambulating;bedside commode Pt Will Perform Toileting - Clothing Manipulation and hygiene: with supervision;sit to/from stand  OT Frequency: Min 2X/week   Barriers to D/C:            Co-evaluation              End of Session CPM Left Knee CPM Left Knee: Off  Activity  Tolerance: Patient tolerated treatment well Patient left: in chair;with call bell/phone within reach;with chair alarm set   Time: [phone] OT Time Calculation (min): 10 min Charges:  OT General Charges $OT Visit: 1 Procedure OT Evaluation $OT Eval Low Complexity: 1 Procedure G-Codes:    Cylan Borum 2015-09-09, 10:05 AM  Lesle Chris, OTR/L [phone] 09-09-2015

## 2015-08-10 NOTE — Progress Notes (Signed)
Physical Therapy Treatment Patient Details Name: Dawn Collins MRN: [id number] DOB: August 29, 1945 Today's Date: 08/10/2015    History of Present Illness L tka , post op N/V    PT Comments    Patient crying but pushed through and ambulated x 60' . Plans Dc tomorrow.  Follow Up Recommendations  Home health PT;Supervision/Assistance - 24 hour     Equipment Recommendations  Rolling walker with 5" wheels    Recommendations for Other Services       Precautions / Restrictions Precautions Precautions: Knee;Fall Required Braces or Orthoses: Knee Immobilizer - Left    Mobility  Bed Mobility Overal bed mobility: Needs Assistance Bed Mobility: Supine to Sit;Sit to Supine     Supine to sit: Min assist Sit to supine: Min assist   General bed mobility comments: assist with :left leg,  extra time to get trunk upright, did not want assistance  Transfers Overall transfer level: Needs assistance Equipment used: Rolling walker (2 wheeled) Transfers: Sit to/from [facility] Sit to Stand: Min assist Stand pivot transfers: Min assist       General transfer comment: assist to rise and steady from bed and recliner, cues for hand and L leg position. assist  back to bed.  Ambulation/Gait Ambulation/Gait assistance: Min assist;+2 safety/equipment Ambulation Distance (Feet): 60 Feet Assistive device: Rolling walker (2 wheeled) Gait Pattern/deviations: Step-to pattern;Antalgic     General Gait Details: multimodal cues for posture, sequence, tends to rotate trunk to the R.  patient crying during activity,    Stairs            Wheelchair Mobility    Modified Rankin (Stroke Patients Only)       Balance                                    Cognition Arousal/Alertness: Awake/alert Behavior During Therapy:  (tearful)                        Exercises Total Joint Exercises Ankle Circles/Pumps: Right;Left;10 reps;Supine Quad Sets:  Left;Right;10 reps;Supine Towel Squeeze: Left;Right;10 reps Heel Slides: Left;10 reps;Supine Hip ABduction/ADduction: Left;10 reps;Supine Straight Leg Raises: Left;10 reps;Supine    General Comments        Pertinent Vitals/Pain Pain Score: 6  Pain Location: L knee Pain Descriptors / Indicators: Aching Pain Intervention(s): Limited activity within patient's tolerance;Premedicated before session    Home Living                      Prior Function            PT Goals (current goals can now be found in the care plan section) Progress towards PT goals: Progressing toward goals    Frequency  7X/week    PT Plan Current plan remains appropriate    Co-evaluation             End of Session Equipment Utilized During Treatment: Left knee immobilizer Activity Tolerance: Patient limited by fatigue;Patient limited by pain Patient left: in bed;with call bell/phone within reach;with family/visitor present     Time: 1440-1515 PT Time Calculation (min) (ACUTE ONLY): 35 min  Charges:  $Gait Training: 8-22 mins $Therapeutic Exercise: 8-22 mins                    G Codes:      Claretha Cooper 08/10/2015, 4:34 PM Tresa Endo  PT [phone]

## 2015-08-10 NOTE — Progress Notes (Signed)
Physical Therapy Treatment Patient Details Name: Yadhira Mckneely MRN: [id number] DOB: January 23, 1946 Today's Date: 08/10/2015    History of Present Illness L tka , post op N/V    PT Comments    Patient  Progressing but not to level for DC, limited by  Dizziness after ambulated to BR. Continue PT.  Follow Up Recommendations  Home health PT;Supervision/Assistance - 24 hour     Equipment Recommendations  Rolling walker with 5" wheels    Recommendations for Other Services       Precautions / Restrictions Precautions Precautions: Knee;Fall Required Braces or Orthoses: Knee Immobilizer - Left    Mobility  Bed Mobility Overal bed mobility: Needs Assistance Bed Mobility: Supine to Sit;Sit to Supine     Supine to sit: Min assist     General bed mobility comments: assist with :left leg,  extra time to get trunk upright, did not want assistance  Transfers Overall transfer level: Needs assistance Equipment used: Rolling walker (2 wheeled) Transfers: Sit to/from [facility] Sit to Stand: Min assist Stand pivot transfers: Min assist       General transfer comment: assist to rise and steady from bed ,cues for hand and L leg position.  Ambulation/Gait Ambulation/Gait assistance: Min assist;+2 safety/equipment Ambulation Distance (Feet): 20 Feet Assistive device: Rolling walker (2 wheeled) Gait Pattern/deviations: Step-to pattern;Decreased stance time - left;Trunk flexed     General Gait Details: multimodal cues for posture, sequence, tends to rotate trunk to the R.     Stairs            Wheelchair Mobility    Modified Rankin (Stroke Patients Only)       Balance                                    Cognition Arousal/Alertness: Awake/alert Behavior During Therapy:  (emotional, crying)                        Exercises Total Joint Exercises Ankle Circles/Pumps: Right;Left;10 reps;Supine Quad Sets: Left;Right;10  reps;Supine Towel Squeeze: Left;Right;10 reps Heel Slides: Left;10 reps;Supine Hip ABduction/ADduction: Left;10 reps;Supine Straight Leg Raises: Left;10 reps;Supine    General Comments        Pertinent Vitals/Pain Pain Score: 6  Pain Location: L knee Pain Intervention(s): Limited activity within patient's tolerance;Monitored during session;Premedicated before session;Repositioned    Home Living                      Prior Function            PT Goals (current goals can now be found in the care plan section) Progress towards PT goals: Progressing toward goals    Frequency  7X/week    PT Plan Current plan remains appropriate    Co-evaluation             End of Session Equipment Utilized During Treatment: Left knee immobilizer Activity Tolerance: Patient limited by fatigue;Patient limited by pain Patient left: in chair     Time: 0830-0908 PT Time Calculation (min) (ACUTE ONLY): 38 min  Charges:  $Gait Training: 8-22 mins            Therepeutic exercise 8-22       G Codes:      Claretha Cooper 08/10/2015, 4:29 PM

## 2015-08-10 NOTE — Progress Notes (Signed)
   Subjective: 2 Days Post-Op Procedure(s) (LRB): TOTAL LEFT KNEE ARTHROPLASTY (Left) Patient reports pain as mild and moderate.   Patient seen in rounds for Dr. Wynelle Link.  Only able to get up once yesterday due to the nausea. Patient is having problems with nausea/vomiting and pain in the knee, requiring pain medications Plan is to go Home after hospital stay.  Objective: Vital signs in last 24 hours: Temp:  [97.7 F (36.5 C)-98.4 F (36.9 C)] 97.7 F (36.5 C) (01/18 0531) Pulse Rate:  [79-96] 81 (01/18 1025) Resp:  [14-15] 14 (01/18 0531) BP: (145-169)/(65-82) 145/82 mmHg (01/18 1025) SpO2:  [96 %-99 %] 99 % (01/18 0531)  Intake/Output from previous day:  Intake/Output Summary (Last 24 hours) at 08/10/15 1054 Last data filed at 08/10/15 0532  Gross per 24 hour  Intake    480 ml  Output   1150 ml  Net   -670 ml     Labs:  Recent Labs  08/09/15 0424 08/10/15 0408  HGB 11.2* 10.4*    Recent Labs  08/09/15 0424 08/10/15 0408  WBC 9.3 10.2  RBC 3.71* 3.43*  HCT 33.1* 30.4*  PLT 241 256    Recent Labs  08/09/15 0424 08/10/15 0408  NA 136 136  K 3.9 3.6  CL 103 101  CO2 24 27  BUN 11 12  CREATININE 0.65 0.61  GLUCOSE 185* 109*  CALCIUM 8.9 8.6*   No results for input(s): LABPT, INR in the last 72 hours.  EXAM General - Patient is Alert and Appropriate Extremity - Neurovascular intact Sensation intact distally Dressing/Incision - clean, dry, no drainage Motor Function - intact, moving foot and toes well on exam.   Past Medical History  Diagnosis Date  . Hypertension   . Asthma     2 weeks cold,no problems now-well controlled  . GERD (gastroesophageal reflux disease)     tx. pantoprazole  . Diverticulitis of colon 05-28-11    no current problems  . Arthritis     knees, back.  . Bursitis, knee     Bil. Hips, not knee  . Complication of anesthesia     slow to wake up  . PONV (postoperative nausea and vomiting)   . Multiple thyroid nodules     . Sleep apnea     No cpap now -3 months last due to insurance reasons    Assessment/Plan: 2 Days Post-Op Procedure(s) (LRB): TOTAL LEFT KNEE ARTHROPLASTY (Left) Principal Problem:   OA (osteoarthritis) of knee  Estimated body mass index is 29.44 kg/(m^2) as calculated from the following:   Height as of this encounter: 5' 5.75" (1.67 m).   Weight as of this encounter: 82.101 kg (181 lb). Up with therapy Plan for discharge tomorrow Discharge home with home health  DVT Prophylaxis - Eliquis Weight-Bearing as tolerated to left leg  Arlee Muslim, PA-C Orthopaedic Surgery 08/10/2015, 10:54 AM

## 2015-08-11 LAB — CBC
HCT: 30.2 % — ABNORMAL LOW (ref 36.0–46.0)
HEMOGLOBIN: 10.1 g/dL — AB (ref 12.0–15.0)
MCH: 30.2 pg (ref 26.0–34.0)
MCHC: 33.4 g/dL (ref 30.0–36.0)
MCV: 90.4 fL (ref 78.0–100.0)
PLATELETS: 260 10*3/uL (ref 150–400)
RBC: 3.34 MIL/uL — ABNORMAL LOW (ref 3.87–5.11)
RDW: 13.2 % (ref 11.5–15.5)
WBC: 9.4 10*3/uL (ref 4.0–10.5)

## 2015-08-11 NOTE — Progress Notes (Signed)
CRN relays message pt does NOT have rolling walker at home and CRN has already contacted [facility] DME rep. Dawn Collins of change and pt's willingness to pay out of pocket for rolling walker.  No other CM needs were communicated.

## 2015-08-11 NOTE — Progress Notes (Signed)
**Note Dawn-Identified via Obfuscation** Physical Therapy Treatment Patient Details Name: Dawn Collins MRN: [id number] DOB: Nov 30, 1945 Today's Date: 08/11/2015    History of Present Illness L tka , post op N/V    PT Comments    Ready for DC, instruction in step complete.  Follow Up Recommendations  Home health PT;Supervision/Assistance - 24 hour     Equipment Recommendations  Rolling walker with 5" wheels (patient has difficulty accessing hers.)    Recommendations for Other Services       Precautions / Restrictions Precautions Precautions: Knee;Fall Required Braces or Orthoses: Knee Immobilizer - Left Restrictions Weight Bearing Restrictions: No    Mobility  Bed Mobility         Supine to sit: Min assist Sit to supine: Min assist   General bed mobility comments: assist for LLE  Transfers   Equipment used: Rolling walker (2 wheeled) Transfers: Sit to/from Stand Sit to Stand: Min assist         General transfer comment: steadying assistance and cues for UE/LE placement  Ambulation/Gait Ambulation/Gait assistance: Min assist Ambulation Distance (Feet): 10 Feet   Gait Pattern/deviations: Step-to pattern;Trunk flexed     General Gait Details: multimodal cues for posture, sequence, tends to rotate trunk to the R.  patient crying during activity,    Stairs Stairs: Yes Stairs assistance: Min assist Stair Management: No rails;Backwards;With walker Number of Stairs: 1 General stair comments: sister present  for instruction  Wheelchair Mobility    Modified Rankin (Stroke Patients Only)       Balance                                    Cognition Arousal/Alertness: Awake/alert Behavior During Therapy: WFL for tasks assessed/performed Overall Cognitive Status: Within Functional Limits for tasks assessed                      Exercises Total Joint Exercises Ankle Circles/Pumps: Right;Left;10 reps;Supine Quad Sets: Left;Right;10 reps;Supine Towel Squeeze:  Left;Right;10 reps Short Arc Quad: AAROM;Left;10 reps;Supine Heel Slides: Left;10 reps;Supine Hip ABduction/ADduction: Left;10 reps;Supine Straight Leg Raises: Left;10 reps;Supine Goniometric ROM: 10-50 L knee    General Comments        Pertinent Vitals/Pain Pain Score: 4  Pain Location: L knee Pain Descriptors / Indicators: Aching Pain Intervention(s): Limited activity within patient's tolerance;Premedicated before session    Home Living                      Prior Function            PT Goals (current goals can now be found in the care plan section) Progress towards PT goals: Progressing toward goals    Frequency  7X/week    PT Plan Current plan remains appropriate    Co-evaluation             End of Session Equipment Utilized During Treatment: Left knee immobilizer Activity Tolerance: Patient limited by fatigue Patient left:  (w/ OT)     Time: 4287-6811 PT Time Calculation (min) (ACUTE ONLY): 16 min  Charges:  $Gait Training: 8-22 mins $Therapeutic Exercise: 8-22 mins                    G Codes:      Claretha Cooper 08/11/2015, 12:23 PM

## 2015-08-11 NOTE — Care Management Important Message (Signed)
Important Message  Patient Details  Name: Dawn Collins MRN: [id number] Date of Birth: 02-28-1946   Medicare Important Message Given:  Yes    Camillo Flaming 08/11/2015, 1:23 PMImportant Message  Patient Details  Name: Dawn Collins MRN: [id number] Date of Birth: 10/29/1945   Medicare Important Message Given:  Yes    Camillo Flaming 08/11/2015, 1:23 PM

## 2015-08-11 NOTE — Progress Notes (Signed)
RN reviewed discharge instructions with patient and family. All questions answered.   Paperwork and prescriptions given. Patient also discharged with RW.   NT rolled patient down in wheelchair with all belongings.

## 2015-08-11 NOTE — Progress Notes (Signed)
Physical Therapy Treatment Patient Details Name: Dawn Collins MRN: [id number] DOB: 12/19/1945 Today's Date: 08/11/2015    History of Present Illness L tka , post op N/V    PT Comments    Patient feels better but medication has wiped her out. Will  Allow a  Nap and then get up to practice step.  Follow Up Recommendations  Home health PT;Supervision/Assistance - 24 hour     Equipment Recommendations  Rolling walker with 5" wheels    Recommendations for Other Services       Precautions / Restrictions      Mobility  Bed Mobility                  Transfers                    Ambulation/Gait                 Stairs            Wheelchair Mobility    Modified Rankin (Stroke Patients Only)       Balance                                    Cognition Arousal/Alertness: Lethargic;Suspect due to medications                          Exercises Total Joint Exercises Ankle Circles/Pumps: Right;Left;10 reps;Supine Quad Sets: Left;Right;10 reps;Supine Towel Squeeze: Left;Right;10 reps Short Arc Quad: AAROM;Left;10 reps;Supine Heel Slides: Left;10 reps;Supine Hip ABduction/ADduction: Left;10 reps;Supine Straight Leg Raises: Left;10 reps;Supine Goniometric ROM: 10-50 L knee    General Comments        Pertinent Vitals/Pain Pain Score: 3  Pain Location: L knee Pain Descriptors / Indicators: Aching;Sore Pain Intervention(s): Premedicated before session;Limited activity within patient's tolerance    Home Living                      Prior Function            PT Goals (current goals can now be found in the care plan section) Progress towards PT goals: Progressing toward goals    Frequency  7X/week    PT Plan Current plan remains appropriate    Co-evaluation             End of Session   Activity Tolerance: Patient limited by fatigue Patient left: in bed;with bed alarm set;with call  bell/phone within reach     Time: 0910-0923 PT Time Calculation (min) (ACUTE ONLY): 13 min  Charges:  $Therapeutic Exercise: 8-22 mins                    G Codes:      Claretha Cooper 08/11/2015, 9:36 AM Tresa Endo PT [phone]

## 2015-08-11 NOTE — Progress Notes (Signed)
Occupational Therapy Treatment Patient Details Name: Evalina Tabak MRN: [id number] DOB: [birth year] Today's Date: 08/11/2015    History of present illness L tka , post op N/V   OT comments  Pt performed toilet transfer with min guard assistance and cues for safety. She did get hot when in bathroom.  Transferred back to bed and cold cloth provided.  Pt felt better  Follow Up Recommendations  No OT follow up;Supervision/Assistance - 24 hour    Equipment Recommendations  None recommended by OT    Recommendations for Other Services      Precautions / Restrictions Precautions Precautions: Knee;Fall Required Braces or Orthoses: Knee Immobilizer - Left Restrictions Weight Bearing Restrictions: No       Mobility Bed Mobility           Sit to supine: Min assist   General bed mobility comments: assist for LLE  Transfers   Equipment used: Rolling walker (2 wheeled)   Sit to Stand: Min assist         General transfer comment: steadying assistance and cues for UE/LE placement    Balance                                   ADL                           Toilet Transfer: Ambulation;BSC;RW;Minimal assistance             General ADL Comments: min A to manage LLE on commode:  her toilet is very high.  Recommended she use stool or garbage can to support leg.  Pt got hot:  returned to bed and gave her a cool cloth.  Cues for safety with turns.  Friend has new 3:1 she can borrow if needed      Vision                     Perception     Praxis      Cognition   Behavior During Therapy: WFL for tasks assessed/performed Overall Cognitive Status: Within Functional Limits for tasks assessed                       Extremity/Trunk Assessment               Exercises    Shoulder Instructions       General Comments      Pertinent Vitals/ Pain       Pain Score: 7  Pain Location: L knee Pain Descriptors /  Indicators: Aching Pain Intervention(s): Limited activity within patient's tolerance;Monitored during session;Premedicated before session;Repositioned;Ice applied  Home Living                                          Prior Functioning/Environment              Frequency       Progress Toward Goals  OT Goals(current goals can now be found in the care plan section)  Progress towards OT goals: Progressing toward goals     Plan      Co-evaluation                 End of Session CPM Left Knee CPM Left Knee: Off  Activity Tolerance Treatment limited secondary to medical complications (Comment) (got hot)   Patient Left in bed;with call bell/phone within reach;with bed alarm set;with family/visitor present   Nurse Communication          Time: 5868-2574 OT Time Calculation (min): 10 min  Charges: OT General Charges $OT Visit: 1 Procedure OT Treatments $Self Care/Home Management : 8-22 mins  Victoria Henshaw 08/11/2015, 11:46 AM  Lesle Chris, OTR/L [phone] 08/11/2015

## 2015-08-11 NOTE — Progress Notes (Signed)
   Subjective: 3 Days Post-Op Procedure(s) (LRB): TOTAL LEFT KNEE ARTHROPLASTY (Left) Patient reports pain as mild.   Patient seen in rounds by Dr. Wynelle Link.  Doing much better today. Patient is well, and has had no acute complaints or problems Patient is ready to go home today.  Objective: Vital signs in last 24 hours: Temp:  [98.6 F (37 C)-99 F (37.2 C)] 98.7 F (37.1 C) (01/19 0432) Pulse Rate:  [80-87] 83 (01/19 0432) Resp:  [15-16] 15 (01/19 0432) BP: (112-145)/(48-82) 114/48 mmHg (01/19 0432) SpO2:  [96 %-99 %] 97 % (01/19 0432)  Intake/Output from previous day:  Intake/Output Summary (Last 24 hours) at 08/11/15 0842 Last data filed at 08/11/15 0728  Gross per 24 hour  Intake    240 ml  Output    400 ml  Net   -160 ml    Intake/Output this shift: Total I/O In: -  Out: 400 [Urine:400]  Labs:  Recent Labs  08/09/15 0424 08/10/15 0408 08/11/15 0408  HGB 11.2* 10.4* 10.1*    Recent Labs  08/10/15 0408 08/11/15 0408  WBC 10.2 9.4  RBC 3.43* 3.34*  HCT 30.4* 30.2*  PLT 256 260    Recent Labs  08/09/15 0424 08/10/15 0408  NA 136 136  K 3.9 3.6  CL 103 101  CO2 24 27  BUN 11 12  CREATININE 0.65 0.61  GLUCOSE 185* 109*  CALCIUM 8.9 8.6*   No results for input(s): LABPT, INR in the last 72 hours.  EXAM: General - Patient is Alert, Appropriate and Oriented Extremity - Neurovascular intact Sensation intact distally Incision - clean, dry Motor Function - intact, moving foot and toes well on exam.   Assessment/Plan: 3 Days Post-Op Procedure(s) (LRB): TOTAL LEFT KNEE ARTHROPLASTY (Left) Procedure(s) (LRB): TOTAL LEFT KNEE ARTHROPLASTY (Left) Past Medical History  Diagnosis Date  . Hypertension   . Asthma     2 weeks cold,no problems now-well controlled  . GERD (gastroesophageal reflux disease)     tx. pantoprazole  . Diverticulitis of colon 05-28-11    no current problems  . Arthritis     knees, back.  . Bursitis, knee     Bil. Hips,  not knee  . Complication of anesthesia     slow to wake up  . PONV (postoperative nausea and vomiting)   . Multiple thyroid nodules   . Sleep apnea     No cpap now -3 months last due to insurance reasons   Principal Problem:   OA (osteoarthritis) of knee  Estimated body mass index is 29.44 kg/(m^2) as calculated from the following:   Height as of this encounter: 5' 5.75" (1.67 m).   Weight as of this encounter: 82.101 kg (181 lb). Up with therapy Discharge home with home health Diet - Cardiac diet Follow up - in 2 weeks Activity - WBAT Disposition - Home Condition Upon Discharge - Good D/C Meds - See DC Summary DVT Prophylaxis - Eliquis  Arlee Muslim, PA-C Orthopaedic Surgery 08/11/2015, 8:42 AM

## 2015-08-24 ENCOUNTER — Ambulatory Visit: Payer: Medicare Other | Attending: Orthopedic Surgery | Admitting: Physical Therapy

## 2015-08-24 ENCOUNTER — Encounter: Payer: Self-pay | Admitting: Physical Therapy

## 2015-08-24 DIAGNOSIS — M7989 Other specified soft tissue disorders: Secondary | ICD-10-CM | POA: Insufficient documentation

## 2015-08-24 DIAGNOSIS — R262 Difficulty in walking, not elsewhere classified: Secondary | ICD-10-CM | POA: Diagnosis present

## 2015-08-24 DIAGNOSIS — M25662 Stiffness of left knee, not elsewhere classified: Secondary | ICD-10-CM | POA: Diagnosis present

## 2015-08-24 DIAGNOSIS — M25562 Pain in left knee: Secondary | ICD-10-CM | POA: Diagnosis present

## 2015-08-24 NOTE — Therapy (Signed)
[place], [place], [address] Phone: [phone]   Fax:  [phone]  Physical Therapy Evaluation  Patient Details  Name: Dawn Collins MRN: [id number] Date of Birth: Oct 20, 1945 Referring Provider: Wynelle Link  Encounter Date: 08/24/2015      PT End of Session - 08/24/15 1514    Visit Number 1   Date for PT Re-Evaluation 10/22/15   PT Start Time 6301   PT Stop Time 1540   PT Time Calculation (min) 62 min   Activity Tolerance Patient limited by pain   Behavior During Therapy [facility] for tasks assessed/performed      Past Medical History  Diagnosis Date  . Hypertension   . Asthma     2 weeks cold,no problems now-well controlled  . GERD (gastroesophageal reflux disease)     tx. pantoprazole  . Diverticulitis of colon 05-28-11    no current problems  . Arthritis     knees, back.  . Bursitis, knee     Bil. Hips, not knee  . Complication of anesthesia     slow to wake up  . PONV (postoperative nausea and vomiting)   . Multiple thyroid nodules   . Sleep apnea     No cpap now -3 months last due to insurance reasons    Past Surgical History  Procedure Laterality Date  . Cervical disc surgery      1977  . Breast biopsy      several- all benign  . Cardiac catheterization      10'11  . Knee arthroscopy      right '04  . Foot surgery      Bilateral  . 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 Abdominal hysterectomy      partial hyst  . Total knee arthroplasty Left 08/08/2015    Procedure: TOTAL LEFT KNEE ARTHROPLASTY;  Surgeon: Gaynelle Arabian, MD;  Location: [facility];  Service: Orthopedics;  Laterality: Left;    There were no vitals filed for this visit.  Visit Diagnosis:  Left knee pain - Plan: PT plan of care cert/re-cert  Swelling of limb - Plan: PT plan of care  cert/re-cert  Stiffness of left knee - Plan: PT plan of care cert/re-cert  Difficulty walking - Plan: PT plan of care cert/re-cert      Subjective Assessment - 08/24/15 1440    Subjective Pateint underwent a left TKR on 08/08/2015.  Reports 4 day hospital stay, reports some issues with pain meds, she reports that she had fluid drained off of the left knee yesterday.   Limitations Sitting;Standing;Walking;House hold activities   Patient Stated Goals walk better   Currently in Pain? Yes   Pain Score 3    Pain Location Knee   Pain Orientation Left   Pain Descriptors / Indicators Aching;Sore;Tightness   Pain Type Surgical pain   Pain Onset 1 to 4 weeks ago   Pain Frequency Constant   Aggravating Factors  being up on it, bending it, up to 8/10   Pain Relieving Factors rest and tylenol            [facility] PT Assessment - 08/24/15 0001    Assessment   Medical Diagnosis s/p ;left TKR   Referring Provider Aluisio   Onset Date/Surgical Date 08/08/15   Prior Therapy at home   Precautions  Precautions None   Balance Screen   Has the patient fallen in the past 6 months No   Has the patient had a decrease in activity level because of a fear of falling?  No   Is the patient reluctant to leave their home because of a fear of falling?  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   Leisure no exercise   Observation/Other Assessments-Edema    Edema Circumferential   Circumferential Edema   Circumferential - Right 41.5cm   Circumferential - Left  46 cm   AROM   Overall AROM Comments AROM of the left knee 35-85 degrees flexion, seems to be lacking ROM due to the swelling   PROM   Overall PROM Comments PROM of the left knee 10-90 degrees flexion   Strength   Overall Strength Comments left knee extension 2/5  with significant pain, fleixon 4-/5   Palpation   Palpation comment significant edema underthe patella, warm to touch mostly anterior knee redness anterior knee, steristrips in place and bandage over where fluid was drained., ecchymosis anterior ankle and posterior knee   Ambulation/Gait   Gait Comments uses a FWW, very slow, small steps, short stance phase                   [facility] Adult PT Treatment/Exercise - 08/24/15 0001    Exercises   Exercises Knee/Hip   Knee/Hip Exercises: Aerobic   Stationary Bike partial revolutions 5 minutes   Nustep Level 3 x 6 minutes   Modalities   Modalities Vasopneumatic   Vasopneumatic   Number Minutes Vasopneumatic  15 minutes   Vasopnuematic Location  Ankle   Vasopneumatic Pressure Medium   Vasopneumatic Temperature  36                PT Education - 08/24/15 1513    Education provided Yes   Education Details Low load long duration stretch for flexion and extension, ankle pumps and quad sets   Person(s) Educated Patient   Methods Explanation;Demonstration;Handout   Comprehension Verbalized understanding          PT Short Term Goals - 08/24/15 1652    PT SHORT TERM GOAL #1   Title independent with initial HEP   Time 1   Period Weeks   Status New           PT Long Term Goals - 08/24/15 1653    PT LONG TERM GOAL #1   Title decrease pain 50%   Time 8   Period Weeks   Status New   PT LONG TERM GOAL #2   Title increase ROM of the left knee to 5-115 degrees flexion   Time 8   Period Weeks   Status New   PT LONG TERM GOAL #3   Title decrease edema of the left knee by 2 cm   Time 8   Period Weeks   Status New   PT LONG TERM GOAL #4   Title walk without assistive device with minimal deviation   Time 8   Period Weeks   Status New               Plan - 08/24/15 1619    Clinical Impression Statement Patient with a left TKR on 08/08/15.  Had complications form  the pain meds and has had fluid drained off  of the knee, most recent being yesterday.  She is very swollen , red and warm.  She has poor ROM but I feel it is due to the significant swelling.   Pt will benefit from skilled therapeutic intervention in order to improve on the following deficits Abnormal gait;Decreased mobility;Decreased range of motion;Decreased scar mobility;Decreased strength;Increased edema;Difficulty walking;Impaired flexibility;Pain   Rehab Potential Good   PT Frequency 3x / week   PT Duration 4 weeks   PT Treatment/Interventions ADLs/Self Care Home Management;Electrical Stimulation;Cryotherapy;Vasopneumatic Device;Manual techniques;Therapeutic exercise;Functional mobility training;Gait training;Balance training;Patient/family education;Passive range of motion   PT Next Visit Plan add exercises as tolerated   Consulted and Agree with Plan of Care Patient          G-Codes - 2015-09-10 1657    Functional Assessment Tool Used Foto 77% limitation   Functional Limitation Mobility: Walking and moving around   Mobility: Walking and Moving Around Current Status [id number]) At least 60 percent but less than 80 percent impaired, limited or restricted   Mobility: Walking and Moving Around Goal Status [phone]) At least 40 percent but less than 60 percent impaired, limited or restricted       Problem List Patient Active Problem List   Diagnosis Date Noted  . OA (osteoarthritis) of knee 08/08/2015  . Osteoarthritis of right knee 06/04/2011    Sumner Boast., PT 2015/09/10, 5:06 PM  [place], [place], [address] Phone: [phone]   Fax:  [phone]  Name: Jonise Weightman MRN: [id number] Date of Birth: 04-06-1946

## 2015-08-29 ENCOUNTER — Encounter: Payer: Self-pay | Admitting: Physical Therapy

## 2015-08-29 ENCOUNTER — Ambulatory Visit: Payer: Medicare Other | Admitting: Physical Therapy

## 2015-08-29 DIAGNOSIS — M25662 Stiffness of left knee, not elsewhere classified: Secondary | ICD-10-CM

## 2015-08-29 DIAGNOSIS — M25562 Pain in left knee: Secondary | ICD-10-CM | POA: Diagnosis not present

## 2015-08-29 DIAGNOSIS — R262 Difficulty in walking, not elsewhere classified: Secondary | ICD-10-CM

## 2015-08-29 DIAGNOSIS — M7989 Other specified soft tissue disorders: Secondary | ICD-10-CM

## 2015-08-29 NOTE — Therapy (Signed)
[place], [place], [address] Phone: [phone]   Fax:  [phone]  Physical Therapy Treatment  Patient Details  Name: Dawn Collins MRN: [id number] Date of Birth: 1946-04-10 Referring Provider: Wynelle Link  Encounter Date: 08/29/2015      PT End of Session - 08/29/15 1143    Visit Number 2   Date for PT Re-Evaluation 10/22/15   PT Start Time 1100   PT Stop Time 1158   PT Time Calculation (min) 58 min   Activity Tolerance Patient limited by pain;Patient tolerated treatment well      Past Medical History  Diagnosis Date  . Hypertension   . Asthma     2 weeks cold,no problems now-well controlled  . GERD (gastroesophageal reflux disease)     tx. pantoprazole  . Diverticulitis of colon 05-28-11    no current problems  . Arthritis     knees, back.  . Bursitis, knee     Bil. Hips, not knee  . Complication of anesthesia     slow to wake up  . PONV (postoperative nausea and vomiting)   . Multiple thyroid nodules   . Sleep apnea     No cpap now -3 months last due to insurance reasons    Past Surgical History  Procedure Laterality Date  . Cervical disc surgery      1977  . Breast biopsy      several- all benign  . Cardiac catheterization      10'11  . Knee arthroscopy      right '04  . Foot surgery      Bilateral  . 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 Abdominal hysterectomy      partial hyst  . Total knee arthroplasty Left 08/08/2015    Procedure: TOTAL LEFT KNEE ARTHROPLASTY;  Surgeon: Gaynelle Arabian, MD;  [facility]: [facility];  Service: Orthopedics;  Laterality: Left;    There were no vitals filed for this visit.  Visit Diagnosis:  Stiffness of left knee  Swelling of limb  Left knee pain  Difficulty walking      Subjective Assessment - 08/29/15  1059    Subjective "I feel a lot better than I did the first time"   Currently in Pain? No/denies   Pain Score 0-No pain            [facility] PT Assessment - 08/29/15 0001    Circumferential Edema   Circumferential - Left  44 cm   AROM   Overall AROM Comments AROM of the left knee 16-94 degrees flexion   PROM   Overall PROM Comments PROM of the left knee 7-120 degrees flexion                     [facility] Adult PT Treatment/Exercise - 08/29/15 0001    Knee/Hip Exercises: Aerobic   Stationary Bike 5 minutes  full revolutions, seat level 8   Nustep Level 3 x 6 minutes   Knee/Hip Exercises: Machines for Strengthening   Cybex Knee Flexion #20 2x10   Cybex Leg Press #20 2x10   Modalities   Modalities Vasopneumatic   Vasopneumatic   Number Minutes Vasopneumatic  15 minutes   Vasopnuematic Location  Ankle   Vasopneumatic Pressure Medium   Vasopneumatic Temperature  36   Manual Therapy  Manual Therapy Passive ROM   Manual therapy comments PROM taken to end range and held   Passive ROM L knee flex and ext                   PT Short Term Goals - 08/29/15 1147    PT SHORT TERM GOAL #1   Title independent with initial HEP   Status Partially Met           PT Long Term Goals - 08/29/15 1148    PT LONG TERM GOAL #3   Title decrease edema of the left knee by 2 cm   Status Achieved               Plan - 08/29/15 1143    Clinical Impression Statement Pt has L knee swelling with mild warmth although pt has reached edema goal. Tolerated MT well does have more pain with EXT. Pt ale to increase L knee AROM and PROM. Progressed to some gym level intervention this date as well.   Pt will benefit from skilled therapeutic intervention in order to improve on the following deficits Abnormal gait;Decreased mobility;Decreased range of motion;Decreased scar mobility;Decreased strength;Increased edema;Difficulty walking;Impaired flexibility;Pain   Rehab Potential  Good   PT Frequency 3x / week   PT Duration 4 weeks   PT Treatment/Interventions ADLs/Self Care Home Management;Electrical Stimulation;Cryotherapy;Vasopneumatic Device;Manual techniques;Therapeutic exercise;Functional mobility training;Gait training;Balance training;Patient/family education;Passive range of motion   PT Next Visit Plan add exercises as tolerated        Problem List Patient Active Problem List   Diagnosis Date Noted  . OA (osteoarthritis) of knee 08/08/2015  . Osteoarthritis of right knee 06/04/2011    Scot Jun, PTA  08/29/2015, 11:50 AM  [place], [place], [address] Phone: [phone]   Fax:  [phone]  Name: Boni Maclellan MRN: [id number] Date of Birth: [birth year]

## 2015-08-30 ENCOUNTER — Ambulatory Visit: Payer: Medicare Other | Admitting: Physical Therapy

## 2015-08-31 ENCOUNTER — Ambulatory Visit: Payer: Medicare Other | Admitting: Physical Therapy

## 2015-08-31 ENCOUNTER — Encounter: Payer: Self-pay | Admitting: Physical Therapy

## 2015-08-31 DIAGNOSIS — M25562 Pain in left knee: Secondary | ICD-10-CM | POA: Diagnosis not present

## 2015-08-31 DIAGNOSIS — M25662 Stiffness of left knee, not elsewhere classified: Secondary | ICD-10-CM

## 2015-08-31 DIAGNOSIS — M7989 Other specified soft tissue disorders: Secondary | ICD-10-CM

## 2015-08-31 DIAGNOSIS — R262 Difficulty in walking, not elsewhere classified: Secondary | ICD-10-CM

## 2015-08-31 NOTE — Therapy (Signed)
[place], [place], [address] Phone: [phone]   Fax:  [phone]  Physical Therapy Treatment  Patient Details  Name: Dawn Collins MRN: [id number] Date of Birth: 27-Oct-1945 Referring Provider: Wynelle Link  Encounter Date: 08/31/2015      PT End of Session - 08/31/15 1054    Visit Number 3   Date for PT Re-Evaluation 10/22/15   PT Start Time 1007   PT Stop Time 1110   PT Time Calculation (min) 63 min   Activity Tolerance Patient limited by pain;Patient tolerated treatment well   Behavior During Therapy [facility] for tasks assessed/performed      Past Medical History  Diagnosis Date  . Hypertension   . Asthma     2 weeks cold,no problems now-well controlled  . GERD (gastroesophageal reflux disease)     tx. pantoprazole  . Diverticulitis of colon 05-28-11    no current problems  . Arthritis     knees, back.  . Bursitis, knee     Bil. Hips, not knee  . Complication of anesthesia     slow to wake up  . PONV (postoperative nausea and vomiting)   . Multiple thyroid nodules   . Sleep apnea     No cpap now -3 months last due to insurance reasons    Past Surgical History  Procedure Laterality Date  . Cervical disc surgery      1977  . Breast biopsy      several- all benign  . Cardiac catheterization      10'11  . Knee arthroscopy      right '04  . Foot surgery      Bilateral  . 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 Abdominal hysterectomy      partial hyst  . Total knee arthroplasty Left 08/08/2015    Procedure: TOTAL LEFT KNEE ARTHROPLASTY;  Surgeon: Gaynelle Arabian, MD;  [facility]: [facility];  Service: Orthopedics;  Laterality: Left;    There were no vitals filed for this visit.  Visit Diagnosis:  Stiffness of left knee  Swelling of limb  Left knee  pain  Difficulty walking      Subjective Assessment - 08/31/15 1007    Subjective My back is really sore.  Frustrated with the knee swelling and stiffness and the back hurting.   Currently in Pain? Yes   Pain Score 6    Pain Location Back   Pain Orientation Lower   Pain Descriptors / Indicators Aching;Sore   Aggravating Factors  maybe just the way I am walking                         [facility] Adult PT Treatment/Exercise - 08/31/15 0001    Ambulation/Gait   Gait Comments worked with patient on gait with a SPC, cues to decrease trunk lean, decrease antalgic gait and increase step length.  all in an effort to decrease stress on back   Knee/Hip Exercises: Aerobic   Stationary Bike 6 minutes full revolutions seat position #6   Nustep Level 4 x 6 minutes   Knee/Hip Exercises: Machines for Strengthening   Cybex Leg Press #20 2x10, 2x10 left only no weight   Knee/Hip Exercises: Standing   Other Standing Knee Exercises standing weight shifts, standing marching in effort to get  her to trust the leg.   Other Standing Knee Exercises SAQ with assist to get TKE and have VMO fire.   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[employer]   Electrical Stimulation Parameters tolerance    Electrical Stimulation Goals Pain   Vasopneumatic   Number Minutes Vasopneumatic  15 minutes   Vasopnuematic Location  Ankle   Vasopneumatic Pressure Medium   Vasopneumatic Temperature  36   Manual Therapy   Manual therapy comments STM to the knee and scar   Passive ROM PROM of the left knee flexion and extension                  PT Short Term Goals - 08/29/15 1147    PT SHORT TERM GOAL #1   Title independent with initial HEP   Status Partially Met           PT Long Term Goals - 08/31/15 1110    PT LONG TERM GOAL #1   Title decrease pain 50%   Status On-going   PT LONG TERM GOAL #2   Title increase ROM of the left knee to  5-115 degrees flexion   Status On-going               Plan - 08/31/15 1108    Clinical Impression Statement Patient with LBP and c/o stiffness.  She was able to walk with [facility] and without assistive device, she needed cues to work on her trunk posture and trust the left leg, tends to be unsure of the knee and leans forward and to the side.  Has difficulty with TKE.  Very painful woth PROM into extension   PT Next Visit Plan continue to work on gait and TKE   Consulted and Agree with Plan of Care Patient        Problem List Patient Active Problem List   Diagnosis Date Noted  . OA (osteoarthritis) of knee 08/08/2015  . Osteoarthritis of right knee 06/04/2011    Sumner Boast., PT 08/31/2015, 11:13 AM  [place], [place], [address] Phone: [phone]   Fax:  [phone]  Name: Sue Fernicola MRN: [id number] Date of Birth: February 13, 1946

## 2015-09-01 ENCOUNTER — Ambulatory Visit: Payer: Medicare Other | Admitting: Physical Therapy

## 2015-09-02 ENCOUNTER — Encounter: Payer: Self-pay | Admitting: Physical Therapy

## 2015-09-02 ENCOUNTER — Ambulatory Visit: Payer: Medicare Other | Admitting: Physical Therapy

## 2015-09-02 DIAGNOSIS — R262 Difficulty in walking, not elsewhere classified: Secondary | ICD-10-CM

## 2015-09-02 DIAGNOSIS — M7989 Other specified soft tissue disorders: Secondary | ICD-10-CM

## 2015-09-02 DIAGNOSIS — M25562 Pain in left knee: Secondary | ICD-10-CM

## 2015-09-02 DIAGNOSIS — M25662 Stiffness of left knee, not elsewhere classified: Secondary | ICD-10-CM

## 2015-09-02 NOTE — Therapy (Signed)
[place], [place], [address] Phone: [phone]   Fax:  [phone]  Physical Therapy Treatment  Patient Details  Name: Dawn Collins MRN: [id number] Date of Birth: 12-09-1945 Referring Provider: Wynelle Link  Encounter Date: 09/02/2015      PT End of Session - 09/02/15 1142    Visit Number 4   Date for PT Re-Evaluation 10/22/15   PT Start Time 1056   PT Stop Time 1155   PT Time Calculation (min) 59 min   Activity Tolerance Patient limited by pain;Patient tolerated treatment well   Behavior During Therapy [facility] for tasks assessed/performed      Past Medical History  Diagnosis Date  . Hypertension   . Asthma     2 weeks cold,no problems now-well controlled  . GERD (gastroesophageal reflux disease)     tx. pantoprazole  . Diverticulitis of colon 05-28-11    no current problems  . Arthritis     knees, back.  . Bursitis, knee     Bil. Hips, not knee  . Complication of anesthesia     slow to wake up  . PONV (postoperative nausea and vomiting)   . Multiple thyroid nodules   . Sleep apnea     No cpap now -3 months last due to insurance reasons    Past Surgical History  Procedure Laterality Date  . Cervical disc surgery      1977  . Breast biopsy      several- all benign  . Cardiac catheterization      10'11  . Knee arthroscopy      right '04  . Foot surgery      Bilateral  . 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 Abdominal hysterectomy      partial hyst  . Total knee arthroplasty Left 08/08/2015    Procedure: TOTAL LEFT KNEE ARTHROPLASTY;  Surgeon: Gaynelle Arabian, MD;  [facility]: [facility];  Service: Orthopedics;  Laterality: Left;    There were no vitals filed for this visit.  Visit Diagnosis:  Swelling of limb  Stiffness of left knee  Left knee  pain  Difficulty walking      Subjective Assessment - 09/02/15 1059    Subjective "Im feeling good, I think with this knee being swollen is making my back hurt"   Currently in Pain? Yes   Pain Score 6                          [facility] Adult PT Treatment/Exercise - 09/02/15 0001    Ambulation/Gait   Gait Comments worked with patient on gait with a SPC, cues to erect trunk, increase velocity. PT with decrease stance time on LLE   Knee/Hip Exercises: Aerobic   Stationary Bike 8 minutes full revolutions seat position #6   Knee/Hip Exercises: Machines for Strengthening   Cybex Knee Extension #5 2x10   Cybex Knee Flexion #25 2x15   Cybex Leg Press #20 2x10, 2x10 left only no weight   Knee/Hip Exercises: Seated   Long Arc Quad Strengthening;Left;2 sets;10 reps   Manual Therapy   Manual Therapy Passive ROM   Manual therapy comments STM L hamstring   Passive ROM PROM of the left knee flexion and extension  PT Short Term Goals - 08/29/15 1147    PT SHORT TERM GOAL #1   Title independent with initial HEP   Status Partially Met           PT Long Term Goals - 08/31/15 1110    PT LONG TERM GOAL #1   Title decrease pain 50%   Status On-going   PT LONG TERM GOAL #2   Title increase ROM of the left knee to 5-115 degrees flexion   Status On-going               Plan - 09/02/15 1143    Clinical Impression Statement Pt report she she feels better today than previous treatment. Continues to need cues to correct trunk posture when ambulating with SPC. Painful ROM with ext during MT.   Pt will benefit from skilled therapeutic intervention in order to improve on the following deficits Abnormal gait;Decreased mobility;Decreased range of motion;Decreased scar mobility;Decreased strength;Increased edema;Difficulty walking;Impaired flexibility;Pain   Rehab Potential Good   PT Frequency 3x / week   PT Duration 4 weeks   PT  Treatment/Interventions ADLs/Self Care Home Management;Electrical Stimulation;Cryotherapy;Vasopneumatic Device;Manual techniques;Therapeutic exercise;Functional mobility training;Gait training;Balance training;Patient/family education;Passive range of motion   PT Next Visit Plan continue to work on gait and TKE        Problem List Patient Active Problem List   Diagnosis Date Noted  . OA (osteoarthritis) of knee 08/08/2015  . Osteoarthritis of right knee 06/04/2011    Scot Jun, PTA  09/02/2015, 11:48 AM  [place], [place], [address] Phone: [phone]   Fax:  [phone]  Name: Dawn Collins MRN: [id number] Date of Birth: 11-Dec-1945

## 2015-09-05 ENCOUNTER — Ambulatory Visit: Payer: Medicare Other | Admitting: Physical Therapy

## 2015-09-05 ENCOUNTER — Encounter: Payer: Self-pay | Admitting: Physical Therapy

## 2015-09-05 DIAGNOSIS — M25562 Pain in left knee: Secondary | ICD-10-CM

## 2015-09-05 DIAGNOSIS — M7989 Other specified soft tissue disorders: Secondary | ICD-10-CM

## 2015-09-05 DIAGNOSIS — M25662 Stiffness of left knee, not elsewhere classified: Secondary | ICD-10-CM

## 2015-09-05 NOTE — Therapy (Signed)
[place], [place], [address] Phone: [phone]   Fax:  [phone]  Physical Therapy Treatment  Patient Details  Name: Dawn Collins MRN: [id number] Date of Birth: 03-20-1946 Referring Provider: Wynelle Link  Encounter Date: 09/05/2015      PT End of Session - 09/05/15 1101    Visit Number 5   Date for PT Re-Evaluation 10/22/15   PT Start Time 1017   PT Stop Time 1116   PT Time Calculation (min) 59 min   Activity Tolerance Patient tolerated treatment well   Behavior During Therapy [facility] for tasks assessed/performed      Past Medical History  Diagnosis Date  . Hypertension   . Asthma     2 weeks cold,no problems now-well controlled  . GERD (gastroesophageal reflux disease)     tx. pantoprazole  . Diverticulitis of colon 05-28-11    no current problems  . Arthritis     knees, back.  . Bursitis, knee     Bil. Hips, not knee  . Complication of anesthesia     slow to wake up  . PONV (postoperative nausea and vomiting)   . Multiple thyroid nodules   . Sleep apnea     No cpap now -3 months last due to insurance reasons    Past Surgical History  Procedure Laterality Date  . Cervical disc surgery      1977  . Breast biopsy      several- all benign  . Cardiac catheterization      10'11  . Knee arthroscopy      right '04  . Foot surgery      Bilateral  . 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 Abdominal hysterectomy      partial hyst  . Total knee arthroplasty Left 08/08/2015    Procedure: TOTAL LEFT KNEE ARTHROPLASTY;  Surgeon: Gaynelle Arabian, MD;  Location: [facility];  Service: Orthopedics;  Laterality: Left;    There were no vitals filed for this visit.  Visit Diagnosis:  Swelling of limb  Stiffness of left knee  Left knee pain      Subjective  Assessment - 09/05/15 1022    Subjective Pt reports that she had to go to the ED yesterday with heaviness in her chest. PT reports that her knee is ok,Pt reports when she got up this morning she was feeling bad.    Currently in Pain? No/denies   Pain Score 0-No pain   Pain Score 0            [facility] PT Assessment - 09/05/15 0001    AROM   Overall AROM Comments AROM of the left knee 7-115 degrees flexion                     [facility] Adult PT Treatment/Exercise - 09/05/15 0001    Ambulation/Gait   Ambulation/Gait Yes   Ambulation/Gait Assistance 6: Modified independent (Device/Increase time)   Ambulation Distance (Feet) 120 Feet   Assistive device None   Gait Pattern Decreased stance time - left;Decreased hip/knee flexion - left;Antalgic   Ambulation Surface Level;Indoor   Knee/Hip Exercises: Aerobic   Stationary Bike 6 minutes full revolutions seat position #6   Nustep Level 4 x 5 minutes   Knee/Hip Exercises: Machines for Strengthening   Cybex Knee Extension #  5 2x10   Cybex Knee Flexion #25 2x15   Cybex Leg Press #20 3x10, 2x10 left only no weight   Knee/Hip Exercises: Standing   Forward Step Up 2 sets;Left;5 reps;Step Height: 4"   Modalities   Modalities Vasopneumatic;Moist Heat   Moist Heat Therapy   Number Minutes Moist Heat 15 Minutes   Moist Heat Location Lumbar Spine   Vasopneumatic   Number Minutes Vasopneumatic  15 minutes   Vasopnuematic Location  Knee   Vasopneumatic Pressure Medium   Vasopneumatic Temperature  36   Manual Therapy   Manual Therapy Passive ROM   Passive ROM PROM of the left knee flexion and extension                  PT Short Term Goals - 09/05/15 1036    PT SHORT TERM GOAL #1   Title independent with initial HEP   Status Achieved           PT Long Term Goals - 09/05/15 1036    PT LONG TERM GOAL #1   Title decrease pain 50%   Status On-going   PT LONG TERM GOAL #2   Title increase ROM of the left knee to 5-115  degrees flexion   Status Achieved               Plan - 09/05/15 1101    Clinical Impression Statement Pt performed well today, decrease activity tolerance, fatigues with exercises. Pt has progressed towards  goals increasing AROM. Pt able to progress with step ups with LLE.    Pt will benefit from skilled therapeutic intervention in order to improve on the following deficits Abnormal gait;Decreased mobility;Decreased range of motion;Decreased scar mobility;Decreased strength;Increased edema;Difficulty walking;Impaired flexibility;Pain   Rehab Potential Good   PT Frequency 3x / week   PT Duration 4 weeks   PT Treatment/Interventions ADLs/Self Care Home Management;Electrical Stimulation;Cryotherapy;Vasopneumatic Device;Manual techniques;Therapeutic exercise;Functional mobility training;Gait training;Balance training;Patient/family education;Passive range of motion   PT Next Visit Plan continue to work on gait and TKE        Problem List Patient Active Problem List   Diagnosis Date Noted  . OA (osteoarthritis) of knee 08/08/2015  . Osteoarthritis of right knee 06/04/2011    Scot Jun, PTA  09/05/2015, 11:04 AM  [place], [place], [address] Phone: [phone]   Fax:  [phone]  Name: Dawn Collins MRN: [id number] Date of Birth: Jul 09, 1946

## 2015-09-07 ENCOUNTER — Ambulatory Visit: Payer: Medicare Other | Admitting: Physical Therapy

## 2015-09-07 DIAGNOSIS — M7989 Other specified soft tissue disorders: Secondary | ICD-10-CM

## 2015-09-07 DIAGNOSIS — R262 Difficulty in walking, not elsewhere classified: Secondary | ICD-10-CM

## 2015-09-07 DIAGNOSIS — M25562 Pain in left knee: Secondary | ICD-10-CM

## 2015-09-07 DIAGNOSIS — M25662 Stiffness of left knee, not elsewhere classified: Secondary | ICD-10-CM

## 2015-09-07 NOTE — Therapy (Signed)
[place], [place], [address] Phone: [phone]   Fax:  [phone]  Physical Therapy Treatment  Patient Details  Name: Dawn Collins MRN: [id number] Date of Birth: 27-Aug-1945 Referring Provider: Wynelle Link  Encounter Date: 09/07/2015      PT End of Session - 09/07/15 1059    Visit Number 6   Date for PT Re-Evaluation 10/22/15   PT Start Time 1014   PT Stop Time 1114   PT Time Calculation (min) 60 min   Activity Tolerance Patient tolerated treatment well   Behavior During Therapy [facility] for tasks assessed/performed      Past Medical History  Diagnosis Date  . Hypertension   . Asthma     2 weeks cold,no problems now-well controlled  . GERD (gastroesophageal reflux disease)     tx. pantoprazole  . Diverticulitis of colon 05-28-11    no current problems  . Arthritis     knees, back.  . Bursitis, knee     Bil. Hips, not knee  . Complication of anesthesia     slow to wake up  . PONV (postoperative nausea and vomiting)   . Multiple thyroid nodules   . Sleep apnea     No cpap now -3 months last due to insurance reasons    Past Surgical History  Procedure Laterality Date  . Cervical disc surgery      1977  . Breast biopsy      several- all benign  . Cardiac catheterization      10'11  . Knee arthroscopy      right '04  . Foot surgery      Bilateral  . 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 Abdominal hysterectomy      partial hyst  . Total knee arthroplasty Left 08/08/2015    Procedure: TOTAL LEFT KNEE ARTHROPLASTY;  Surgeon: Gaynelle Arabian, MD;  [facility];  Service: Orthopedics;  Laterality: Left;    There were no vitals filed for this visit.  Visit Diagnosis:  Difficulty walking  Left knee pain  Stiffness of left knee  Swelling of limb       Subjective Assessment - 09/07/15 1017    Subjective "Im doing good, Its getting better"   Currently in Pain? No/denies   Pain Score 0-No pain            [facility] PT Assessment - 09/07/15 0001    Circumferential Edema   Circumferential - Left  40cm                     [facility] Adult PT Treatment/Exercise - 09/07/15 0001    Knee/Hip Exercises: Aerobic   Stationary Bike 7 minutes full revolutions seat position #6   Knee/Hip Exercises: Machines for Strengthening   Cybex Leg Press #20 3x10, 2x10 left only no weight   Knee/Hip Exercises: Standing   Other Standing Knee Exercises Seated TKE with green Tband x10   Knee/Hip Exercises: Seated   Long Arc Quad Strengthening;Left;2 sets;10 reps   Long Arc Quad Weight 1 lbs.   Other Seated Knee/Hip Exercises seated march 2x10   Marching Limitations #3 on L thigh   Sit to Sand 2 sets;5 reps;without UE support  cues to use LLE    Moist Heat Therapy   Number Minutes Moist Heat 15  Minutes   Moist Heat Location Lumbar Spine   Vasopneumatic   Number Minutes Vasopneumatic  15 minutes   Vasopnuematic Location  Knee   Vasopneumatic Pressure Medium   Vasopneumatic Temperature  36   Manual Therapy   Manual Therapy Passive ROM   Passive ROM PROM of the left knee flexion and extension                  PT Short Term Goals - 09/05/15 1036    PT SHORT TERM GOAL #1   Title independent with initial HEP   Status Achieved           PT Long Term Goals - 09/05/15 1036    PT LONG TERM GOAL #1   Title decrease pain 50%   Status On-going   PT LONG TERM GOAL #2   Title increase ROM of the left knee to 5-115 degrees flexion   Status Achieved               Plan - 09/07/15 1100    Clinical Impression Statement Pain with MT with ext. L knee edema has decreased. Tolerated all activity well, pt lacks TKE with LLE, but performs all exercises well with available ROM.   Pt will benefit from skilled therapeutic intervention  in order to improve on the following deficits Abnormal gait;Decreased mobility;Decreased range of motion;Decreased scar mobility;Decreased strength;Increased edema;Difficulty walking;Impaired flexibility;Pain   Rehab Potential Good   PT Frequency 3x / week   PT Duration 4 weeks   PT Treatment/Interventions ADLs/Self Care Home Management;Electrical Stimulation;Cryotherapy;Vasopneumatic Device;Manual techniques;Therapeutic exercise;Functional mobility training;Gait training;Balance training;Patient/family education;Passive range of motion        Problem List Patient Active Problem List   Diagnosis Date Noted  . OA (osteoarthritis) of knee 08/08/2015  . Osteoarthritis of right knee 06/04/2011    Scot Jun, PTA  09/07/2015, 11:02 AM  [place], [place], [address] Phone: [phone]   Fax:  [phone]  Name: Layce Sprung MRN: [id number] Date of Birth: [birth year]

## 2015-09-09 ENCOUNTER — Ambulatory Visit: Payer: Medicare Other | Admitting: Physical Therapy

## 2015-09-09 ENCOUNTER — Encounter: Payer: Self-pay | Admitting: Physical Therapy

## 2015-09-09 DIAGNOSIS — M7989 Other specified soft tissue disorders: Secondary | ICD-10-CM

## 2015-09-09 DIAGNOSIS — R262 Difficulty in walking, not elsewhere classified: Secondary | ICD-10-CM

## 2015-09-09 DIAGNOSIS — M25562 Pain in left knee: Secondary | ICD-10-CM

## 2015-09-09 DIAGNOSIS — M25662 Stiffness of left knee, not elsewhere classified: Secondary | ICD-10-CM

## 2015-09-09 NOTE — Therapy (Signed)
[place], [place], [address] Phone: [phone]   Fax:  [phone]  Physical Therapy Treatment  Patient Details  Name: Dawn Collins MRN: [id number] Date of Birth: 10/13/1945 Referring Provider: Wynelle Link  Encounter Date: 09/09/2015      PT End of Session - 09/09/15 1133    Visit Number 7   Date for PT Re-Evaluation 10/22/15   PT Start Time 1053   PT Stop Time 1150   PT Time Calculation (min) 57 min   Activity Tolerance Patient tolerated treatment well   Behavior During Therapy [facility] for tasks assessed/performed      Past Medical History  Diagnosis Date  . Hypertension   . Asthma     2 weeks cold,no problems now-well controlled  . GERD (gastroesophageal reflux disease)     tx. pantoprazole  . Diverticulitis of colon 05-28-11    no current problems  . Arthritis     knees, back.  . Bursitis, knee     Bil. Hips, not knee  . Complication of anesthesia     slow to wake up  . PONV (postoperative nausea and vomiting)   . Multiple thyroid nodules   . Sleep apnea     No cpap now -3 months last due to insurance reasons    Past Surgical History  Procedure Laterality Date  . Cervical disc surgery      1977  . Breast biopsy      several- all benign  . Cardiac catheterization      10'11  . Knee arthroscopy      right '04  . Foot surgery      Bilateral  . 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 Abdominal hysterectomy      partial hyst  . Total knee arthroplasty Left 08/08/2015    Procedure: TOTAL LEFT KNEE ARTHROPLASTY;  Surgeon: Gaynelle Arabian, MD;  Location: [facility];  Service: Orthopedics;  Laterality: Left;    There were no vitals filed for this visit.  Visit Diagnosis:  Difficulty walking  Left knee pain  Stiffness of left knee  Swelling of limb       Subjective Assessment - 09/09/15 1057    Subjective A little sore, I just don't feel all that good with my body   Currently in Pain? Yes   Pain Score 2    Pain Location Knee   Pain Orientation Left  also c/o back pain                         [facility] Adult PT Treatment/Exercise - 09/09/15 0001    Ambulation/Gait   Gait Comments gait around the building, slow with SPC, very fatigued at the end, had to really slow down due to fatigue   Knee/Hip Exercises: Aerobic   Stationary Bike Level 0 x 5 minutes   Nustep Level 4 x 7 minutes   Knee/Hip Exercises: Machines for Strengthening   Other Machine TKE and LAQ with 3# each leg   Knee/Hip Exercises: Standing   Other Standing Knee Exercises feet on ball  K2C and bridges   Knee/Hip Exercises: Seated   Sit to Sand 2 sets;5 reps;without UE support   Moist Heat Therapy   Number Minutes Moist Heat 15 Minutes   Moist Heat Location Lumbar Spine  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   Electrical Stimulation Parameters tolerance   Electrical Stimulation Goals Pain   Manual Therapy   Manual Therapy Passive ROM   Passive ROM PROM of the left knee flexion and extension                  PT Short Term Goals - 09/05/15 1036    PT SHORT TERM GOAL #1   Title independent with initial HEP   Status Achieved           PT Long Term Goals - 09/09/15 1137    PT LONG TERM GOAL #1   Title decrease pain 50%   Status On-going   PT LONG TERM GOAL #2   Title increase ROM of the left knee to 5-115 degrees flexion   Status Achieved               Plan - 09/09/15 1135    Clinical Impression Statement Patient is overall doing much better with gait, still fatigues very easily, SOB with walk around building, needed a rest.  She is still lacking the TKE   PT Next Visit Plan continue to work on gait and TKE   Consulted and Agree with Plan of Care Patient         Problem List Patient Active Problem List   Diagnosis Date Noted  . OA (osteoarthritis) of knee 08/08/2015  . Osteoarthritis of right knee 06/04/2011    Sumner Boast., PT 09/09/2015, 11:39 AM  [place], [place], [address] Phone: [phone]   Fax:  [phone]  Name: Dawn Collins MRN: [id number] Date of Birth: 1945-07-30

## 2015-09-12 ENCOUNTER — Encounter: Payer: Self-pay | Admitting: Physical Therapy

## 2015-09-12 ENCOUNTER — Ambulatory Visit: Payer: Medicare Other | Admitting: Physical Therapy

## 2015-09-12 DIAGNOSIS — M25562 Pain in left knee: Secondary | ICD-10-CM

## 2015-09-12 DIAGNOSIS — M7989 Other specified soft tissue disorders: Secondary | ICD-10-CM

## 2015-09-12 DIAGNOSIS — M25662 Stiffness of left knee, not elsewhere classified: Secondary | ICD-10-CM

## 2015-09-12 DIAGNOSIS — R262 Difficulty in walking, not elsewhere classified: Secondary | ICD-10-CM

## 2015-09-12 NOTE — Therapy (Signed)
[place], [place], [address] Phone: [phone]   Fax:  [phone]  Physical Therapy Treatment  Patient Details  Name: Dawn Collins MRN: [id number] Date of Birth: Oct 17, 1945 Referring Provider: Wynelle Link  Encounter Date: 09/12/2015    Past Medical History  Diagnosis Date  . Hypertension   . Asthma     2 weeks cold,no problems now-well controlled  . GERD (gastroesophageal reflux disease)     tx. pantoprazole  . Diverticulitis of colon 05-28-11    no current problems  . Arthritis     knees, back.  . Bursitis, knee     Bil. Hips, not knee  . Complication of anesthesia     slow to wake up  . PONV (postoperative nausea and vomiting)   . Multiple thyroid nodules   . Sleep apnea     No cpap now -3 months last due to insurance reasons    Past Surgical History  Procedure Laterality Date  . Cervical disc surgery      1977  . Breast biopsy      several- all benign  . Cardiac catheterization      10'11  . Knee arthroscopy      right '04  . Foot surgery      Bilateral  . 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 Abdominal hysterectomy      partial hyst  . Total knee arthroplasty Left 08/08/2015    Procedure: TOTAL LEFT KNEE ARTHROPLASTY;  Surgeon: Gaynelle Arabian, MD;  [facility];  Service: Orthopedics;  Laterality: Left;    There were no vitals filed for this visit.  Visit Diagnosis:  Left knee pain  Stiffness of left knee  Difficulty walking  Swelling of limb      Subjective Assessment - 09/12/15 1105    Subjective Pt ambulated in clinic without AD. "I had a good weekend, my knee don't hurt so bad now"   Currently in Pain? Yes   Pain Score 3    Pain Location Knee   Pain Orientation Left            [facility] PT Assessment - 09/12/15 0001    AROM   Overall AROM Comments AROM of the left knee 5-117 degrees flexion                     [facility] Adult PT Treatment/Exercise - 09/12/15 0001    Knee/Hip Exercises: Aerobic   Stationary Bike Level 0 x 5 minutes   Nustep Level 6 x 6 minutes   Knee/Hip Exercises: Machines for Strengthening   Cybex Leg Press #25 3x10, 2x10 left only no weight   Other Machine TKE and LAQ with 3# each leg   Knee/Hip Exercises: Standing   Other Standing Knee Exercises Walking with sports cord #30 x3    Knee/Hip Exercises: Seated   Hamstring Curl 2 sets;10 reps;Left   Hamstring Limitations green Tband    Sit to Sand 2 sets;5 reps;without UE support   Modalities   Modalities Vasopneumatic;Moist Heat   Moist Heat Therapy   Number Minutes Moist Heat 15 Minutes   Moist Heat Location Lumbar Spine   Vasopneumatic   Number Minutes Vasopneumatic  15 minutes   Vasopnuematic Location  Knee   Vasopneumatic Pressure Medium   Vasopneumatic Temperature  36  Manual Therapy   Manual Therapy Passive ROM   Passive ROM PROM of the left knee flexion and extension                  PT Short Term Goals - 09/05/15 1036    PT SHORT TERM GOAL #1   Title independent with initial HEP   Status Achieved           PT Long Term Goals - 09/12/15 1146    PT LONG TERM GOAL #1   Title decrease pain 50%   Status Partially Met   PT LONG TERM GOAL #2   Title increase ROM of the left knee to 5-115 degrees flexion   Status Achieved   PT LONG TERM GOAL #3   Title decrease edema of the left knee by 2 cm   Status Achieved   PT LONG TERM GOAL #4   Title walk without assistive device with minimal deviation   Status On-going               Plan - 09/12/15 1147    Clinical Impression Statement Continues to lack TKE but doing well overall. Completed all interventions well. Pt able to progress to resisted walking but has some balance issues. Pt  has a decrease activity tolerance with exercises and  requires some rest.    Pt will benefit from skilled therapeutic intervention in order to improve on the following deficits Abnormal gait;Decreased mobility;Decreased range of motion;Decreased scar mobility;Decreased strength;Increased edema;Difficulty walking;Impaired flexibility;Pain   Rehab Potential Good   PT Frequency 3x / week   PT Duration 4 weeks   PT Treatment/Interventions ADLs/Self Care Home Management;Electrical Stimulation;Cryotherapy;Vasopneumatic Device;Manual techniques;Therapeutic exercise;Functional mobility training;Gait training;Balance training;Patient/family education;Passive range of motion   PT Next Visit Plan continue to work on gait and TKE        Problem List Patient Active Problem List   Diagnosis Date Noted  . OA (osteoarthritis) of knee 08/08/2015  . Osteoarthritis of right knee 06/04/2011    Scot Jun, PTA  09/12/2015, 11:51 AM  [place], [place], [address] Phone: [phone]   Fax:  [phone]  Name: Dawn Collins MRN: [id number] Date of Birth: [birth year]

## 2015-09-13 ENCOUNTER — Telehealth: Payer: Self-pay

## 2015-09-13 NOTE — Telephone Encounter (Signed)
09/13/15 sister called, patient in hospital with infection

## 2015-09-14 ENCOUNTER — Ambulatory Visit: Payer: Medicare Other | Admitting: Physical Therapy

## 2015-09-16 ENCOUNTER — Ambulatory Visit: Payer: Medicare Other | Admitting: Physical Therapy

## 2015-09-19 ENCOUNTER — Encounter: Payer: Self-pay | Admitting: Physical Therapy

## 2015-09-19 ENCOUNTER — Ambulatory Visit: Payer: Medicare Other | Admitting: Physical Therapy

## 2015-09-19 DIAGNOSIS — M25662 Stiffness of left knee, not elsewhere classified: Secondary | ICD-10-CM

## 2015-09-19 DIAGNOSIS — M25562 Pain in left knee: Secondary | ICD-10-CM | POA: Diagnosis not present

## 2015-09-19 DIAGNOSIS — M7989 Other specified soft tissue disorders: Secondary | ICD-10-CM

## 2015-09-19 DIAGNOSIS — R262 Difficulty in walking, not elsewhere classified: Secondary | ICD-10-CM

## 2015-09-19 NOTE — Therapy (Signed)
[place], [place], [address] Phone: [phone]   Fax:  [phone]  Physical Therapy Treatment  Patient Details  Name: Dawn Collins MRN: [id number] Date of Birth: [birth year] Referring Provider: Wynelle Link  Encounter Date: 09/19/2015      PT End of Session - 09/19/15 1049    Visit Number 8   PT Start Time 6962   PT Stop Time 1105   PT Time Calculation (min) 50 min   Activity Tolerance Patient tolerated treatment well   Behavior During Therapy [facility] for tasks assessed/performed      Past Medical History  Diagnosis Date  . Hypertension   . Asthma     2 weeks cold,no problems now-well controlled  . GERD (gastroesophageal reflux disease)     tx. pantoprazole  . Diverticulitis of colon 05-28-11    no current problems  . Arthritis     knees, back.  . Bursitis, knee     Bil. Hips, not knee  . Complication of anesthesia     slow to wake up  . PONV (postoperative nausea and vomiting)   . Multiple thyroid nodules   . Sleep apnea     No cpap now -3 months last due to insurance reasons    Past Surgical History  Procedure Laterality Date  . Cervical disc surgery      1977  . Breast biopsy      several- all benign  . Cardiac catheterization      10'11  . Knee arthroscopy      right '04  . Foot surgery      Bilateral  . 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 Abdominal hysterectomy      partial hyst  . Total knee arthroplasty Left 08/08/2015    Procedure: TOTAL LEFT KNEE ARTHROPLASTY;  Surgeon: Gaynelle Arabian, MD;  [facility]: [facility];  Service: Orthopedics;  Laterality: Left;    There were no vitals filed for this visit.  Visit Diagnosis:  Left knee pain  Stiffness of left knee  Difficulty walking  Swelling of limb      Subjective Assessment - 09/19/15  1014    Subjective "Its not that bad, Im not taking that much stuff for pain" "After Wednesday I am on my own"   Currently in Pain? Yes   Pain Score 3    Pain Location Knee   Pain Orientation Left            [facility] PT Assessment - 09/19/15 0001    AROM   Overall AROM Comments AROM of the left knee 5-117 degrees flexion                     [facility] Adult PT Treatment/Exercise - 09/19/15 0001    Ambulation/Gait   Stairs Yes   Stairs Assistance 5: Supervision   Stair Management Technique One rail Right;Alternating pattern;Step to pattern   Number of Stairs 12   Height of Stairs 6   Gait Comments Fatigues easily    Knee/Hip Exercises: Aerobic   Stationary Bike Level 0 x 5 minutes   Nustep Level 5 x 6 minutes   Knee/Hip Exercises: Machines for Strengthening   Cybex Knee Extension #5 2x10   Cybex Knee Flexion #25 2x15   Cybex Leg Press #15 2x15 LLE only  Modalities   Modalities Vasopneumatic;Moist Heat   Moist Heat Therapy   Number Minutes Moist Heat 15 Minutes   Moist Heat Location Lumbar Spine   Vasopneumatic   Number Minutes Vasopneumatic  15 minutes   Vasopnuematic Location  Knee   Vasopneumatic Pressure Medium   Vasopneumatic Temperature  36                  PT Short Term Goals - 09/05/15 1036    PT SHORT TERM GOAL #1   Title independent with initial HEP   Status Achieved           PT Long Term Goals - 09/12/15 1146    PT LONG TERM GOAL #1   Title decrease pain 50%   Status Partially Met   PT LONG TERM GOAL #2   Title increase ROM of the left knee to 5-115 degrees flexion   Status Achieved   PT LONG TERM GOAL #3   Title decrease edema of the left knee by 2 cm   Status Achieved   PT LONG TERM GOAL #4   Title walk without assistive device with minimal deviation   Status On-going               Plan - 09/19/15 1050    Clinical Impression Statement Pt with a recent hospital stay with a stomach bug unable to attend therapy  last week. today pt with a decrease activity tolerance with stair negotiation and exercises. Pt reports that her MD said it will be her last week of PT since she is going well.    Pt will benefit from skilled therapeutic intervention in order to improve on the following deficits Abnormal gait;Decreased mobility;Decreased range of motion;Decreased scar mobility;Decreased strength;Increased edema;Difficulty walking;Impaired flexibility;Pain   Rehab Potential Good   PT Frequency 3x / week   PT Duration 4 weeks   PT Treatment/Interventions ADLs/Self Care Home Management;Electrical Stimulation;Cryotherapy;Vasopneumatic Device;Manual techniques;Therapeutic exercise;Functional mobility training;Gait training;Balance training;Patient/family education;Passive range of motion   PT Next Visit Plan D/C PT        Problem List Patient Active Problem List   Diagnosis Date Noted  . OA (osteoarthritis) of knee 08/08/2015  . Osteoarthritis of right knee 06/04/2011    Scot Jun, PTA  09/19/2015, 10:53 AM  [place], [place], [address] Phone: [phone]   Fax:  [phone]  Name: Dawn Collins MRN: [id number] Date of Birth: [birth year]

## 2015-09-21 ENCOUNTER — Ambulatory Visit: Payer: Medicare Other | Attending: Orthopedic Surgery | Admitting: Physical Therapy

## 2015-09-21 ENCOUNTER — Encounter: Payer: Self-pay | Admitting: Physical Therapy

## 2015-09-21 DIAGNOSIS — R262 Difficulty in walking, not elsewhere classified: Secondary | ICD-10-CM

## 2015-09-21 DIAGNOSIS — M25662 Stiffness of left knee, not elsewhere classified: Secondary | ICD-10-CM | POA: Insufficient documentation

## 2015-09-21 DIAGNOSIS — M25562 Pain in left knee: Secondary | ICD-10-CM | POA: Insufficient documentation

## 2015-09-21 DIAGNOSIS — M7989 Other specified soft tissue disorders: Secondary | ICD-10-CM | POA: Diagnosis present

## 2015-09-21 NOTE — Therapy (Signed)
[address] [place], [place], [address] Phone: [phone]   Fax:  [phone]  Physical Therapy Treatment  Patient Details  Name: Dawn Collins MRN: [id number] Date of Birth: 05-Apr-1946 Referring Provider: Wynelle Link  Encounter Date: 09/21/2015      PT End of Session - 09/21/15 1047    Visit Number 9   Date for PT Re-Evaluation 10/22/15   PT Start Time 1012   PT Stop Time 1110   PT Time Calculation (min) 58 min   Activity Tolerance Patient tolerated treatment well   Behavior During Therapy [facility] for tasks assessed/performed      Past Medical History  Diagnosis Date  . Hypertension   . Asthma     2 weeks cold,no problems now-well controlled  . GERD (gastroesophageal reflux disease)     tx. pantoprazole  . Diverticulitis of colon 05-28-11    no current problems  . Arthritis     knees, back.  . Bursitis, knee     Bil. Hips, not knee  . Complication of anesthesia     slow to wake up  . PONV (postoperative nausea and vomiting)   . Multiple thyroid nodules   . Sleep apnea     No cpap now -3 months last due to insurance reasons    Past Surgical History  Procedure Laterality Date  . Cervical disc surgery      1977  . Breast biopsy      several- all benign  . Cardiac catheterization      10'11  . Knee arthroscopy      right '04  . Foot surgery      Bilateral  . 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 Abdominal hysterectomy      partial hyst  . Total knee arthroplasty Left 08/08/2015    Procedure: TOTAL LEFT KNEE ARTHROPLASTY;  Surgeon: Gaynelle Arabian, MD;  Location: [facility];  Service: Orthopedics;  Laterality: Left;    There were no vitals filed for this visit.  Visit Diagnosis:  Left knee pain  Stiffness of left knee  Difficulty walking  Swelling of limb       Subjective Assessment - 09/21/15 1013    Subjective Feeling better.  I think I am able to try on my own   Currently in Pain? Yes   Pain Score 1    Pain Location Knee   Pain Orientation Left   Aggravating Factors  jsut walking a lot   Pain Relieving Factors rest                         [facility] Adult PT Treatment/Exercise - 09/21/15 0001    Ambulation/Gait   Gait Comments gait around the building, she tends to take very small steps, she does not have a heel strike, has mid foot strike, need cues to correct but has difficulty with this, she also fatigues easily, needed two rest when going around the building   Knee/Hip Exercises: Stretches   Gastroc Stretch 3 reps;10 seconds   Knee/Hip Exercises: Aerobic   Stationary Bike Level 0 x 5 minutes   Nustep Level 5 x 6 minutes   Knee/Hip Exercises: Machines for Strengthening   Cybex Knee Extension #5 2x10   Cybex Knee Flexion #25 2x15   Other Machine TKE and  LAQ with 3# each leg   Vasopneumatic   Number Minutes Vasopneumatic  15 minutes   Vasopnuematic Location  Knee   Vasopneumatic Pressure Medium   Vasopneumatic Temperature  36   Manual Therapy   Passive ROM PROM of the left knee flexion and extension                PT Education - 10-02-15 1047    Education provided Yes   Education Details went over with patient RICE, the exercises to continue to do at home and really worked on her gait pattern   Person(s) Educated Patient   Methods Explanation;Demonstration;Verbal cues   Comprehension Verbal cues required;Returned demonstration;Verbalized understanding          PT Short Term Goals - 09/05/15 1036    PT SHORT TERM GOAL #1   Title independent with initial HEP   Status Achieved           PT Long Term Goals - 10/02/15 1052    PT LONG TERM GOAL #1   Title decrease pain 50%   Status Achieved   PT LONG TERM GOAL #2   Title increase ROM of the left knee to 5-115 degrees flexion   Status Achieved    PT LONG TERM GOAL #3   Title decrease edema of the left knee by 2 cm   Status Achieved   PT LONG TERM GOAL #4   Title walk without assistive device with minimal deviation   Status Partially Met               Plan - 10-02-15 1050    Clinical Impression Statement Patient's MD feels that she can do on her own.  She has good ROM, my only concern is she is still very fatigued with walking, she also has a poor gait pattern, very slow, small steps, no heel strike only mid foot strike.  We went over all things for her to do at home to help with the gait and what to do as she returns to work with [employer] and shorter days.   PT Next Visit Plan D/C PT   Consulted and Agree with Plan of Care Patient          G-Codes - 10-02-15 1052    Functional Assessment Tool Used foto 51% limitation   Functional Limitation Mobility: Walking and moving around   Mobility: Walking and Moving Around Current Status [phone]) At least 40 percent but less than 60 percent impaired, limited or restricted   Mobility: Walking and Moving Around Goal Status [phone]) At least 40 percent but less than 60 percent impaired, limited or restricted   Mobility: Walking and Moving Around Discharge Status [phone]) At least 40 percent but less than 60 percent impaired, limited or restricted      Problem List Patient Active Problem List   Diagnosis Date Noted  . OA (osteoarthritis) of knee 08/08/2015  . Osteoarthritis of right knee 06/04/2011    PHYSICAL THERAPY DISCHARGE SUMMARY   Plan: Patient agrees to discharge.  Patient goals were met. Patient is being discharged due to meeting the stated rehab goals.  ?????       Sumner Boast., PT 02-Oct-2015, 10:54 AM  [address] [place], [place], [address] Phone: [phone]   Fax:  [phone]  Name: Dawn Collins MRN: [id number] Date of Birth: [birth year]

## 2016-01-02 ENCOUNTER — Emergency Department (HOSPITAL_BASED_OUTPATIENT_CLINIC_OR_DEPARTMENT_OTHER): Payer: Medicare Other

## 2016-01-02 ENCOUNTER — Emergency Department (HOSPITAL_BASED_OUTPATIENT_CLINIC_OR_DEPARTMENT_OTHER)
Admission: EM | Admit: 2016-01-02 | Discharge: 2016-01-02 | Disposition: A | Discharge status: Home / Self Care | Payer: Medicare Other | Attending: Emergency Medicine | Admitting: Emergency Medicine

## 2016-01-02 ENCOUNTER — Encounter (HOSPITAL_BASED_OUTPATIENT_CLINIC_OR_DEPARTMENT_OTHER): Payer: Self-pay

## 2016-01-02 DIAGNOSIS — M1990 Unspecified osteoarthritis, unspecified site: Secondary | ICD-10-CM | POA: Diagnosis not present

## 2016-01-02 DIAGNOSIS — Z7982 Long term (current) use of aspirin: Secondary | ICD-10-CM | POA: Insufficient documentation

## 2016-01-02 DIAGNOSIS — R0602 Shortness of breath: Secondary | ICD-10-CM | POA: Diagnosis not present

## 2016-01-02 DIAGNOSIS — J45909 Unspecified asthma, uncomplicated: Secondary | ICD-10-CM | POA: Insufficient documentation

## 2016-01-02 DIAGNOSIS — I10 Essential (primary) hypertension: Secondary | ICD-10-CM | POA: Diagnosis not present

## 2016-01-02 DIAGNOSIS — R0789 Other chest pain: Secondary | ICD-10-CM | POA: Diagnosis present

## 2016-01-02 DIAGNOSIS — Z7951 Long term (current) use of inhaled steroids: Secondary | ICD-10-CM | POA: Diagnosis not present

## 2016-01-02 DIAGNOSIS — Z79899 Other long term (current) drug therapy: Secondary | ICD-10-CM | POA: Diagnosis not present

## 2016-01-02 DIAGNOSIS — R079 Chest pain, unspecified: Secondary | ICD-10-CM

## 2016-01-02 HISTORY — DX: Pneumonia, unspecified organism: J18.9

## 2016-01-02 LAB — URINALYSIS, ROUTINE W REFLEX MICROSCOPIC
Bilirubin Urine: NEGATIVE
GLUCOSE, UA: NEGATIVE mg/dL
HGB URINE DIPSTICK: NEGATIVE
KETONES UR: NEGATIVE mg/dL
Leukocytes, UA: NEGATIVE
Nitrite: NEGATIVE
PH: 8 (ref 5.0–8.0)
Protein, ur: NEGATIVE mg/dL
SPECIFIC GRAVITY, URINE: 1.016 (ref 1.005–1.030)

## 2016-01-02 LAB — CBC WITH DIFFERENTIAL/PLATELET
BASOS PCT: 0 %
Basophils Absolute: 0 10*3/uL (ref 0.0–0.1)
EOS ABS: 0.1 10*3/uL (ref 0.0–0.7)
EOS PCT: 2 %
HEMATOCRIT: 37.4 % (ref 36.0–46.0)
Hemoglobin: 12.8 g/dL (ref 12.0–15.0)
LYMPHS ABS: 2 10*3/uL (ref 0.7–4.0)
LYMPHS PCT: 21 %
MCH: 30 pg (ref 26.0–34.0)
MCHC: 34.2 g/dL (ref 30.0–36.0)
MCV: 87.6 fL (ref 78.0–100.0)
Monocytes Absolute: 0.8 10*3/uL (ref 0.1–1.0)
Monocytes Relative: 8 %
NEUTROS ABS: 6.5 10*3/uL (ref 1.7–7.7)
NEUTROS PCT: 69 %
Platelets: 289 10*3/uL (ref 150–400)
RBC: 4.27 MIL/uL (ref 3.87–5.11)
RDW: 13.4 % (ref 11.5–15.5)
WBC: 9.4 10*3/uL (ref 4.0–10.5)

## 2016-01-02 LAB — TROPONIN I
Troponin I: <0.03 ng/mL (ref <0.031)
Troponin I: <0.03 ng/mL (ref <0.031)

## 2016-01-02 LAB — COMPREHENSIVE METABOLIC PANEL
ALBUMIN: 3.9 g/dL (ref 3.5–5.0)
ALT: 12 U/L — ABNORMAL LOW (ref 14–54)
ANION GAP: 8 (ref 5–15)
AST: 17 U/L (ref 15–41)
Alkaline Phosphatase: 53 U/L (ref 38–126)
BILIRUBIN TOTAL: 0.6 mg/dL (ref 0.3–1.2)
BUN: 11 mg/dL (ref 6–20)
CALCIUM: 9.1 mg/dL (ref 8.9–10.3)
CO2: 26 mmol/L (ref 22–32)
CREATININE: 0.74 mg/dL (ref 0.44–1.00)
Chloride: 102 mmol/L (ref 101–111)
GFR calc non Af Amer: >60 mL/min (ref >60)
GLUCOSE: 86 mg/dL (ref 65–99)
Potassium: 3.3 mmol/L — ABNORMAL LOW (ref 3.5–5.1)
SODIUM: 136 mmol/L (ref 135–145)
TOTAL PROTEIN: 6.9 g/dL (ref 6.5–8.1)

## 2016-01-02 LAB — LIPASE, BLOOD: Lipase: 22 U/L (ref 11–51)

## 2016-01-02 LAB — BRAIN NATRIURETIC PEPTIDE: B Natriuretic Peptide: 136.7 pg/mL — ABNORMAL HIGH (ref 0.0–100.0)

## 2016-01-02 MED ORDER — POTASSIUM CHLORIDE CRYS ER 20 MEQ PO TBCR
40.0000 meq | EXTENDED_RELEASE_TABLET | Freq: Once | ORAL | Status: AC
  Administered 2016-01-02: 40 meq via ORAL
  Filled 2016-01-02: qty 2

## 2016-01-02 MED ORDER — IOPAMIDOL (ISOVUE-370) INJECTION 76%
100.0000 mL | Freq: Once | INTRAVENOUS | Status: AC | PRN
  Administered 2016-01-02: 100 mL via INTRAVENOUS

## 2016-01-02 NOTE — Discharge Instructions (Signed)
Chest Pain Observation °It is often hard to give a specific diagnosis for the cause of chest pain. Among other possibilities your symptoms might be caused by inadequate oxygen delivery to your heart (angina). Angina that is not treated or evaluated can lead to a heart attack (myocardial infarction) or death. °Blood tests, electrocardiograms, and X-rays may have been done to help determine a possible cause of your chest pain. After evaluation and observation, your health care provider has determined that it is unlikely your pain was caused by an unstable condition that requires hospitalization. However, a full evaluation of your pain may need to be completed, with additional diagnostic testing as directed. It is very important to keep your follow-up appointments. Not keeping your follow-up appointments could result in permanent heart damage, disability, or death. If there is any problem keeping your follow-up appointments, you must call your health care provider. °HOME CARE INSTRUCTIONS  °Due to the slight chance that your pain could be angina, it is important to follow your health care provider's treatment plan and also maintain a healthy lifestyle: °· Maintain or work toward achieving a healthy weight. °· Stay physically active and exercise regularly. °· Decrease your salt intake. °· Eat a balanced, healthy diet. Talk to a dietitian to learn about heart-healthy foods. °· Increase your fiber intake by including whole grains, vegetables, fruits, and nuts in your diet. °· Avoid situations that cause stress, anger, or depression. °· Take medicines as advised by your health care provider. Report any side effects to your health care provider. Do not stop medicines or adjust the dosages on your own. °· Quit smoking. Do not use nicotine patches or gum until you check with your health care provider. °· Keep your blood pressure, blood sugar, and cholesterol levels within normal limits. °· Limit alcohol intake to no more than  1 drink per day for women who are not pregnant and 2 drinks per day for men. °· Do not abuse drugs. °SEEK IMMEDIATE MEDICAL CARE IF: °You have severe chest pain or pressure which may include symptoms such as: °· You feel pain or pressure in your arms, neck, jaw, or back. °· You have severe back or abdominal pain, feel sick to your stomach (nauseous), or throw up (vomit). °· You are sweating profusely. °· You are having a fast or irregular heartbeat. °· You feel short of breath while at rest. °· You notice increasing shortness of breath during rest, sleep, or with activity. °· You have chest pain that does not get better after rest or after taking your usual medicine. °· You wake from sleep with chest pain. °· You are unable to sleep because you cannot breathe. °· You develop a frequent cough or you are coughing up blood. °· You feel dizzy, faint, or experience extreme fatigue. °· You develop severe weakness, dizziness, fainting, or chills. °Any of these symptoms may represent a serious problem that is an emergency. Do not wait to see if the symptoms will go away. Call your local emergency services (911 in the [place] Do not drive yourself to the hospital. °MAKE SURE YOU: °· Understand these instructions. °· Will watch your condition. °· Will get help right away if you are not doing well or get worse. °  °This information is not intended to replace advice given to you by your health care provider. Make sure you discuss any questions you have with your health care provider. °  °Document Released: 08/11/2010 Document Revised: 07/14/2013 Document Reviewed: 01/08/2013 °Elsevier Interactive Patient   Education 2016 [employer].  Shortness of Breath Shortness of breath means you have trouble breathing. It could also mean that you have a medical problem. You should get immediate medical care for shortness of breath. CAUSES   Not enough oxygen in the air such as with high altitudes or a smoke-filled room.  Certain  lung diseases, infections, or problems.  Heart disease or conditions, such as angina or heart failure.  Low red blood cells (anemia).  Poor physical fitness, which can cause shortness of breath when you exercise.  Chest or back injuries or stiffness.  Being overweight.  Smoking.  Anxiety, which can make you feel like you are not getting enough air. DIAGNOSIS  Serious medical problems can often be found during your physical exam. Tests may also be done to determine why you are having shortness of breath. Tests may include:  Chest X-rays.  Lung function tests.  Blood tests.  An electrocardiogram (ECG).  An ambulatory electrocardiogram. An ambulatory ECG records your heartbeat patterns over a 24-hour period.  Exercise testing.  A transthoracic echocardiogram (TTE). During echocardiography, sound waves are used to evaluate how blood flows through your heart.  A transesophageal echocardiogram (TEE).  Imaging scans. Your health care provider may not be able to find a cause for your shortness of breath after your exam. In this case, it is important to have a follow-up exam with your health care provider as directed.  TREATMENT  Treatment for shortness of breath depends on the cause of your symptoms and can vary greatly. HOME CARE INSTRUCTIONS   Do not smoke. Smoking is a common cause of shortness of breath. If you smoke, ask for help to quit.  Avoid being around chemicals or things that may bother your breathing, such as paint fumes and dust.  Rest as needed. Slowly resume your usual activities.  If medicines were prescribed, take them as directed for the full length of time directed. This includes oxygen and any inhaled medicines.  Keep all follow-up appointments as directed by your health care provider. SEEK MEDICAL CARE IF:   Your condition does not improve in the time expected.  You have a hard time doing your normal activities even with rest.  You have any new  symptoms. SEEK IMMEDIATE MEDICAL CARE IF:   Your shortness of breath gets worse.  You feel light-headed, faint, or develop a cough not controlled with medicines.  You start coughing up blood.  You have pain with breathing.  You have chest pain or pain in your arms, shoulders, or abdomen.  You have a fever.  You are unable to walk up stairs or exercise the way you normally do. MAKE SURE YOU:  Understand these instructions.  Will watch your condition.  Will get help right away if you are not doing well or get worse.   This information is not intended to replace advice given to you by your health care provider. Make sure you discuss any questions you have with your health care provider.   Document Released: 04/03/2001 Document Revised: 07/14/2013 Document Reviewed: 09/24/2011 Elsevier Interactive Patient Education [employer].

## 2016-01-02 NOTE — ED Notes (Signed)
MD at bedside. 

## 2016-01-02 NOTE — ED Notes (Addendum)
C/o SOB "for month"-hx of PNE in March-states f/u with pulmonologist in May-states SOB is worse today-also c/o recent pain to right leg but denies at present-NAD-steady gait

## 2016-01-02 NOTE — ED Provider Notes (Signed)
CSN: [id number]     Arrival date & time 01/02/16  1537 History   First MD Initiated Contact with Patient 01/02/16 1553     Chief Complaint  Patient presents with  . Chest Pain   HPI  Dawn Collins is a 70 year old female with PMHx of HTN, asthma, GERD, arthritis and OSA presenting with SOB. Pt reports intermittent SOB over the past few months. She has seen her PCP and pulmonologist for this and most recently started on Breo. She was also hospitalized in March for CAP. She states that typically her shortness of breath resolves with her albuterol inhaler but this did not improve her symptoms today. She states her SOB has been intermittent with no specific exacerbating factor. She denies increasing SOB on exertion. She does note that laying flat seemed to worsen the SOB so she had to lay with a few more pillows behind her head. She endorses a dry cough but states this is her normal cough. She also complains of a sensation of heaviness underneath her breasts and in her epigastric region. She states this also started today. The chest pain is not exertional and does not radiate. She states this is different from the chest tightness sensation she gets with her asthma exacerbations. She is also concerned about intermittent right calf pain x 2 months. She states that the pain is not present today and she is worried that a blood clot is causing her SOB symptoms. Denies fevers, chills, diaphoresis, headache, dizziness, syncope, URI symptoms, abdominal pain, nausea, vomiting, unilateral calf swelling, weakness, numbness or difficulty walking.    Chart review: pt evaluated multiple times by her PCP in the past few months for SOB that was assumed to be related to asthma. She was hospitalized at [facility] for CAP on 3/25. She has followed up with her pulmonologist and had Breo added to her asthma medications.   Past Medical History  Diagnosis Date  . Hypertension   . Asthma     2 weeks cold,no problems now-well controlled   . GERD (gastroesophageal reflux disease)     tx. pantoprazole  . Diverticulitis of colon 05-28-11    no current problems  . Arthritis     knees, back.  . Bursitis, knee     Bil. Hips, not knee  . Complication of anesthesia     slow to wake up  . PONV (postoperative nausea and vomiting)   . Multiple thyroid nodules   . Sleep apnea     No cpap now -3 months last due to insurance reasons  . Pneumonia    Past Surgical History  Procedure Laterality Date  . Cervical disc surgery      1977  . Breast biopsy      several- all benign  . Cardiac catheterization      10'11  . Knee arthroscopy      right '04  . Foot surgery      Bilateral  . 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 Abdominal hysterectomy      partial hyst  . Total knee arthroplasty Left 08/08/2015    Procedure: TOTAL LEFT KNEE ARTHROPLASTY;  Surgeon: Gaynelle Arabian, MD;  Location: [facility];  Service: Orthopedics;  Laterality: Left;   No family history on file. Social History  Substance Use Topics  . Smoking status: Never Smoker   . Smokeless tobacco:  None  . Alcohol Use: No   OB History    No data available     Review of Systems  All other systems reviewed and are negative.     Allergies  Morphine and related; Avelox; Celebrex; Clarithromycin; Codeine; Food; Hycodan; Pentazocine; Septra; Sertraline hcl; Stadol; Duraphen; Floxin; and Penicillins  Home Medications   Prior to Admission medications   Medication Sig Start Date End Date Taking? Authorizing Provider  acetaminophen (TYLENOL) 500 MG tablet Take 500-1,000 mg by mouth every 6 (six) hours as needed. For arthritis   Yes Historical Provider, MD  ALBUTEROL IN Inhale into the lungs.   Yes Historical Provider, MD  amiloride-hydrochlorothiazide (MODURETIC) 5-50 MG tablet Take 1 tablet by mouth daily.     Yes Historical Provider, MD   aspirin EC 81 MG tablet Take 81 mg by mouth daily.   Yes Historical Provider, MD  cetirizine (ZYRTEC) 10 MG tablet Take 10 mg by mouth at bedtime as needed for allergies.   Yes Historical Provider, MD  diltiazem (CARDIZEM CD) 300 MG 24 hr capsule Take 300 mg by mouth daily.     Yes Historical Provider, MD  estradiol (ESTRACE) 2 MG tablet Take 2 mg by mouth daily.   Yes Historical Provider, MD  Fluticasone Furoate-Vilanterol (BREO ELLIPTA IN) Inhale into the lungs.   Yes Historical Provider, MD  gabapentin (NEURONTIN) 300 MG capsule Take 300 mg by mouth daily as needed. pain 06/28/15  Yes Historical Provider, MD  hydrochlorothiazide (HYDRODIURIL) 12.5 MG tablet Take 12.5 mg by mouth daily.   Yes Historical Provider, MD  latanoprost (XALATAN) 0.005 % ophthalmic solution Place 1 drop into both eyes at bedtime. 06/20/15  Yes Historical Provider, MD  losartan (COZAAR) 50 MG tablet Take 50 mg by mouth daily.   Yes Historical Provider, MD  pantoprazole (PROTONIX) 40 MG tablet Take 40 mg by mouth daily.     Yes Historical Provider, MD  apixaban (ELIQUIS) 2.5 MG TABS tablet Take 1 tablet (2.5 mg total) by mouth every 12 (twelve) hours. Take Eliquis twice a day for two and a half more weeks, then discontinue the Eliquis. Once the patient has completed the blood thinner regimen, then take a Baby 81 mg Aspirin daily for three more weeks. 08/09/15   Arlee Muslim, PA-C  HYDROmorphone (DILAUDID) 2 MG tablet Take 1-2 tablets (2-4 mg total) by mouth every 4 (four) hours as needed for moderate pain or severe pain. 08/09/15   Arlee Muslim, PA-C   BP 140/68 mmHg  Pulse 76  Temp(Src) 98.9 F (37.2 C) (Oral)  Resp 14  Ht 5' 5.75" (1.67 m)  Wt 76.658 kg  BMI 27.49 kg/m2  SpO2 98% Physical Exam  Constitutional: She appears well-developed and well-nourished. No distress.  HENT:  Head: Normocephalic and atraumatic.  Eyes: Conjunctivae are normal. Right eye exhibits no discharge. Left eye exhibits no discharge. No  scleral icterus.  Neck: Normal range of motion.  Cardiovascular: Normal rate, regular rhythm, normal heart sounds and intact distal pulses.   Pulmonary/Chest: Effort normal and breath sounds normal. No respiratory distress. She has no wheezes.  Breathing unlabored. Pt states she is not currently SOB. Lungs CTAB. No wheeze.   Abdominal: Soft. Bowel sounds are normal. She exhibits no distension. There is no tenderness.  Musculoskeletal: Normal range of motion.  No tenderness of right posterior calf. No unilateral calf swelling. No erythema, warmth or palpable cords of right calf. Negative Homans.   Neurological: She is alert. Coordination normal.  Skin: Skin  is warm and dry.  Psychiatric: She has a normal mood and affect. Her behavior is normal.  Nursing note and vitals reviewed.   ED Course  Procedures (including critical care time) Labs Review Labs Reviewed  COMPREHENSIVE METABOLIC PANEL - Abnormal; Notable for the following:    Potassium 3.3 (*)    ALT 12 (*)    All other components within normal limits  URINALYSIS, ROUTINE W REFLEX MICROSCOPIC (NOT AT [facility]) - Abnormal; Notable for the following:    APPearance CLOUDY (*)    All other components within normal limits  BRAIN NATRIURETIC PEPTIDE - Abnormal; Notable for the following:    B Natriuretic Peptide 136.7 (*)    All other components within normal limits  CBC WITH DIFFERENTIAL/PLATELET  LIPASE, BLOOD  TROPONIN I  TROPONIN I    Imaging Review Dg Chest 2 View  01/02/2016  CLINICAL DATA:  Increased shortness of breath with chest tightness since this morning, history asthma, hypertension EXAM: CHEST  2 VIEW COMPARISON:  12/19/2015 FINDINGS: Normal heart size, mediastinal contours, and pulmonary vascularity. Lungs clear. No pleural effusion or pneumothorax. Multilevel endplate spur formation thoracic spine. IMPRESSION: No acute abnormalities. Electronically Signed   By: Lavonia Dana M.D.   On: 01/02/2016 16:36   Ct Angio Chest Pe  W/cm &/or Wo Cm  01/02/2016  CLINICAL DATA:  Shortness of breath, chest tightness, right lower leg pain. Recent history of pneumonia. EXAM: CT ANGIOGRAPHY CHEST WITH CONTRAST TECHNIQUE: Multidetector CT imaging of the chest was performed using the standard protocol during bolus administration of intravenous contrast. Multiplanar CT image reconstructions and MIPs were obtained to evaluate the vascular anatomy. CONTRAST:  100 mL Isovue 370 IV COMPARISON:  Chest radiographs dated 01/02/2016. CTA chest dated 06/24/2011. FINDINGS: No evidence of pulmonary embolism to the segmental level. Although not tailored for evaluation of the thoracic aorta, there is no evidence of thoracic aortic aneurysm or dissection. Mediastinum/Nodes: Heart is normal in size. No pericardial effusion. Mild coronary atherosclerosis the LAD. Mild atherosclerotic calcification the aortic arch. No suspicious mediastinal lymphadenopathy. Visualized thyroid is unremarkable. Lungs/Pleura: Evaluation of the lung parenchyma is constrained by respiratory motion. No suspicious pulmonary nodules. Mild dependent atelectasis in the bilateral lower lobes. Mild compressive atelectasis in the medial right lower lobe. No focal consolidation. No pleural effusion or pneumothorax. Upper abdomen: Visualized upper abdomen is within normal limits. Musculoskeletal: Degenerative changes of the visualized thoracolumbar spine. Review of the MIP images confirms the above findings. IMPRESSION: No evidence of pulmonary embolism. No evidence of acute cardiopulmonary disease. Electronically Signed   By: Julian Hy M.D.   On: 01/02/2016 17:51   I have personally reviewed and evaluated these images and lab results as part of my medical decision-making.   EKG Interpretation   Date/Time:  Monday January 02 2016 16:05:44 EDT Ventricular Rate:  70 PR Interval:  129 QRS Duration: 98 QT Interval:  388 QTC Calculation: 419 R Axis:   9 Text Interpretation:  Sinus  rhythm Ventricular premature complex Abnormal  R-wave progression, early transition PVC new from prior No other  significant change from prior ECG Confirmed by [facility] MD, ERIN ([id number])  on 01/02/2016 4:30:49 PM      MDM   Final diagnoses:  SOB (shortness of breath)  Chest pain, unspecified chest pain type   70 year old female presenting with SOB x multiple months with worsening today and chest pain. Afebrile and nontoxic appearing. Lungs CTAB and pt is in no respiratory distress. Heart RRR. No unilateral calf  swelling, erythema, warmth or TTP. Remaining exam benign. CBC unremarkable. Mildly kypokalemic which was repleted in ED. BNP mildly elevated at 136; BNP elevated to 119 in 2015. Initial troponin negative with nonischemic ECG. CXR negative. CT angio negative for PE. Patient monitored in ED for 3 hours with a negative delta troponin. Given length of respiratory symptoms and pt remaining asymptomatic in ED, unlikely to be acute pulm emergency. Pt is stable for cardiac and pulm outpatient follow up. Pt has been advised to return to the ED if CP becomes exertional, associated with diaphoresis or nausea, radiates to left jaw/arm, worsens or becomes concerning in any way. Pt appears reliable for follow up and is agreeable to discharge.   Case has been discussed with and seen by Dr. Billy Fischer who agrees with the above plan to discharge.      Dawn Collins Dmiya Malphrus, PA-C 01/02/16 2056  Gareth Morgan, MD 01/04/16 1701

## 2016-01-02 NOTE — ED Notes (Signed)
Pt states she has long history of respiratory illness, most recent pna treated in may, pt reports today she has felt sob, but with pna and asthma attacks the sob resolved. Per patient this sob is more of a heaviness feeling on her chest, that prevents her from getting deep breath. Reports remote history of cath pain to left leg last week and wants to ensure she has no clot. + cms to feet, no pain at this time, pa currently at bedside

## 2016-04-02 ENCOUNTER — Emergency Department (HOSPITAL_BASED_OUTPATIENT_CLINIC_OR_DEPARTMENT_OTHER)
Admission: EM | Admit: 2016-04-02 | Discharge: 2016-04-02 | Disposition: A | Discharge status: Home / Self Care | Payer: Medicare Other | Attending: Emergency Medicine | Admitting: Emergency Medicine

## 2016-04-02 ENCOUNTER — Emergency Department (HOSPITAL_BASED_OUTPATIENT_CLINIC_OR_DEPARTMENT_OTHER): Payer: Medicare Other

## 2016-04-02 ENCOUNTER — Encounter (HOSPITAL_BASED_OUTPATIENT_CLINIC_OR_DEPARTMENT_OTHER): Payer: Self-pay | Admitting: Adult Health

## 2016-04-02 DIAGNOSIS — Z79899 Other long term (current) drug therapy: Secondary | ICD-10-CM | POA: Diagnosis not present

## 2016-04-02 DIAGNOSIS — I10 Essential (primary) hypertension: Secondary | ICD-10-CM | POA: Diagnosis not present

## 2016-04-02 DIAGNOSIS — R079 Chest pain, unspecified: Secondary | ICD-10-CM

## 2016-04-02 DIAGNOSIS — R0789 Other chest pain: Secondary | ICD-10-CM | POA: Diagnosis present

## 2016-04-02 DIAGNOSIS — J45909 Unspecified asthma, uncomplicated: Secondary | ICD-10-CM | POA: Diagnosis not present

## 2016-04-02 DIAGNOSIS — Z7982 Long term (current) use of aspirin: Secondary | ICD-10-CM | POA: Diagnosis not present

## 2016-04-02 LAB — COMPREHENSIVE METABOLIC PANEL
ALK PHOS: 53 U/L (ref 38–126)
ALT: 15 U/L (ref 14–54)
AST: 23 U/L (ref 15–41)
Albumin: 3.7 g/dL (ref 3.5–5.0)
Anion gap: 6 (ref 5–15)
BILIRUBIN TOTAL: 0.5 mg/dL (ref 0.3–1.2)
BUN: 11 mg/dL (ref 6–20)
CALCIUM: 9.2 mg/dL (ref 8.9–10.3)
CO2: 27 mmol/L (ref 22–32)
CREATININE: 0.86 mg/dL (ref 0.44–1.00)
Chloride: 104 mmol/L (ref 101–111)
GFR calc non Af Amer: >60 mL/min (ref >60)
GLUCOSE: 138 mg/dL — AB (ref 65–99)
Potassium: 3 mmol/L — ABNORMAL LOW (ref 3.5–5.1)
SODIUM: 137 mmol/L (ref 135–145)
Total Protein: 7 g/dL (ref 6.5–8.1)

## 2016-04-02 LAB — CBC WITH DIFFERENTIAL/PLATELET
Basophils Absolute: 0 10*3/uL (ref 0.0–0.1)
Basophils Relative: 0 %
EOS ABS: 0.1 10*3/uL (ref 0.0–0.7)
Eosinophils Relative: 2 %
HEMATOCRIT: 37.7 % (ref 36.0–46.0)
HEMOGLOBIN: 13.1 g/dL (ref 12.0–15.0)
LYMPHS ABS: 1.6 10*3/uL (ref 0.7–4.0)
LYMPHS PCT: 23 %
MCH: 30.1 pg (ref 26.0–34.0)
MCHC: 34.7 g/dL (ref 30.0–36.0)
MCV: 86.7 fL (ref 78.0–100.0)
Monocytes Absolute: 0.7 10*3/uL (ref 0.1–1.0)
Monocytes Relative: 9 %
NEUTROS ABS: 4.7 10*3/uL (ref 1.7–7.7)
NEUTROS PCT: 66 %
Platelets: 266 10*3/uL (ref 150–400)
RBC: 4.35 MIL/uL (ref 3.87–5.11)
RDW: 12.6 % (ref 11.5–15.5)
WBC: 7.2 10*3/uL (ref 4.0–10.5)

## 2016-04-02 LAB — TROPONIN I: Troponin I: <0.03 ng/mL (ref <0.03)

## 2016-04-02 MED ORDER — NITROGLYCERIN 0.4 MG SL SUBL
0.4000 mg | SUBLINGUAL_TABLET | SUBLINGUAL | Status: DC | PRN
  Administered 2016-04-02: 0.4 mg via SUBLINGUAL
  Filled 2016-04-02: qty 1

## 2016-04-02 MED ORDER — ASPIRIN 81 MG PO CHEW
162.0000 mg | CHEWABLE_TABLET | Freq: Once | ORAL | Status: AC
  Administered 2016-04-02: 162 mg via ORAL
  Filled 2016-04-02: qty 2

## 2016-04-02 NOTE — ED Notes (Signed)
Patient transported to X-ray 

## 2016-04-02 NOTE — ED Triage Notes (Signed)
Presents with sternal chest pain that radiates into left neck and left shoulder associated with nausea, sob and feeling hot. The pain began a little while and ususally goes away with rest, today it is persistent and described as pressure. Has an appointment with cardiology tomorrow. Took 2 ASA and protonix with no relief.

## 2016-04-02 NOTE — ED Provider Notes (Signed)
[facility] Provider Note   CSN: [id number] Arrival date & time: 04/02/16  1949  By signing my name below, I, Emmanuella Mensah, attest that this documentation has been prepared under the direction and in the presence of Veryl Speak, MD. Electronically Signed: Judithann Sauger, ED Scribe. 04/02/16. 8:39 PM.   History   Chief Complaint Chief Complaint  Patient presents with  . Chest Pain   HPI Comments: Dawn Collins is a 70 y.o. female with a hx of hypertension and GERD who presents to the Emergency Department complaining of waxing and weaning constant chest pain with pressure that radiates up her anterior neck onset this morning. She reports associated palpitation, diaphoresis, and nausea. She explains that she has been having these symptoms intermittently for a while but today it has been constant. No alleviating factors noted. Pt has tried 2 ASA and Protonix with temporary relief. She reports a family hx of heart issues and a personal hx of cardiac catheterization. She denies that she is a current smoker. She denies any fever, SOB, or bilateral leg swelling. Pt has an upcoming appointment with a Cardiologist tomorrow.   The history is provided by the patient. No language interpreter was used.    Past Medical History:  Diagnosis Date  . Arthritis    knees, back.  . Asthma    2 weeks cold,no problems now-well controlled  . Bursitis, knee    Bil. Hips, not knee  . Complication of anesthesia    slow to wake up  . Diverticulitis of colon 05-28-11   no current problems  . GERD (gastroesophageal reflux disease)    tx. pantoprazole  . Hypertension   . Multiple thyroid nodules   . Pneumonia   . PONV (postoperative nausea and vomiting)   . Sleep apnea    No cpap now -3 months last due to insurance reasons    Patient Active Problem List   Diagnosis Date Noted  . OA (osteoarthritis) of knee 08/08/2015  . Osteoarthritis of right knee 06/04/2011    Past Surgical  History:  Procedure Laterality Date  . ABDOMINAL HYSTERECTOMY     partial hyst  . BREAST BIOPSY     several- all benign  . CARDIAC CATHETERIZATION     10'11  . [place]  . FOOT SURGERY     Bilateral  . KNEE ARTHROSCOPY     right '04  . TOTAL KNEE ARTHROPLASTY  06/04/2011   Procedure: TOTAL KNEE ARTHROPLASTY;  Surgeon: Gearlean Alf;  Location: [facility];  Service: Orthopedics;  Laterality: Right;  . TOTAL KNEE ARTHROPLASTY Left 08/08/2015   Procedure: TOTAL LEFT KNEE ARTHROPLASTY;  Surgeon: Gaynelle Arabian, MD;  Location: [facility];  Service: Orthopedics;  Laterality: Left;    OB History    No data available       Home Medications    Prior to Admission medications   Medication Sig Start Date End Date Taking? Authorizing Provider  aspirin EC 81 MG tablet Take 81 mg by mouth daily.   Yes Historical Provider, MD  diltiazem (CARDIZEM CD) 300 MG 24 hr capsule Take 300 mg by mouth daily.     Yes Historical Provider, MD  Fluticasone Furoate-Vilanterol (BREO ELLIPTA IN) Inhale into the lungs.   Yes Historical Provider, MD  gabapentin (NEURONTIN) 300 MG capsule Take 300 mg by mouth daily as needed. pain 06/28/15  Yes Historical Provider, MD  losartan (COZAAR) 50 MG tablet Take 50 mg by mouth daily.  Yes Historical Provider, MD  pantoprazole (PROTONIX) 40 MG tablet Take 40 mg by mouth daily.     Yes Historical Provider, MD  acetaminophen (TYLENOL) 500 MG tablet Take 500-1,000 mg by mouth every 6 (six) hours as needed. For arthritis    Historical Provider, MD  ALBUTEROL IN Inhale into the lungs.    Historical Provider, MD  amiloride-hydrochlorothiazide (MODURETIC) 5-50 MG tablet Take 1 tablet by mouth daily.      Historical Provider, MD  apixaban (ELIQUIS) 2.5 MG TABS tablet Take 1 tablet (2.5 mg total) by mouth every 12 (twelve) hours. Take Eliquis twice a day for two and a half more weeks, then discontinue the Eliquis. Once the patient has completed the blood thinner  regimen, then take a Baby 81 mg Aspirin daily for three more weeks. 08/09/15   Arlee Muslim, PA-C  cetirizine (ZYRTEC) 10 MG tablet Take 10 mg by mouth at bedtime as needed for allergies.    Historical Provider, MD  estradiol (ESTRACE) 2 MG tablet Take 2 mg by mouth daily.    Historical Provider, MD  hydrochlorothiazide (HYDRODIURIL) 12.5 MG tablet Take 12.5 mg by mouth daily.    Historical Provider, MD  HYDROmorphone (DILAUDID) 2 MG tablet Take 1-2 tablets (2-4 mg total) by mouth every 4 (four) hours as needed for moderate pain or severe pain. 08/09/15   Arlee Muslim, PA-C  latanoprost (XALATAN) 0.005 % ophthalmic solution Place 1 drop into both eyes at bedtime. 06/20/15   Historical Provider, MD    Family History History reviewed. No pertinent family history.  Social History Social History  Substance Use Topics  . Smoking status: Never Smoker  . Smokeless tobacco: Not on file  . Alcohol use No     Allergies   Morphine and related; Avelox [moxifloxacin hcl in nacl]; Celebrex [celecoxib]; Clarithromycin; Codeine; Food; Hycodan [hydrocodone-homatropine]; Pentazocine; Septra [bactrim]; Sertraline hcl; Stadol [butorphanol tartrate]; Duraphen [phenylephrine-guaifenesin]; Floxin [ofloxacin]; and Penicillins   Review of Systems Review of Systems  A complete 10 system review of systems was obtained and all systems are negative except as noted in the HPI and PMH.    Physical Exam Updated Vital Signs BP 145/72   Pulse 73   Resp 18   Ht 5' 5.75" (1.67 m)   Wt 170 lb (77.1 kg)   SpO2 100%   BMI 27.65 kg/m   Physical Exam  Constitutional: She is oriented to person, place, and time. She appears well-developed and well-nourished. No distress.  HENT:  Head: Normocephalic and atraumatic.  Eyes: EOM are normal.  Neck: Normal range of motion.  Cardiovascular: Normal rate, regular rhythm and normal heart sounds.   Pulmonary/Chest: Effort normal and breath sounds normal.  Abdominal: Soft.  She exhibits no distension. There is no tenderness.  Musculoskeletal: Normal range of motion.  Neurological: She is alert and oriented to person, place, and time.  Skin: Skin is warm and dry.  Psychiatric: She has a normal mood and affect. Judgment normal.  Nursing note and vitals reviewed.    ED Treatments / Results  DIAGNOSTIC STUDIES: Oxygen Saturation is 100% on RA, normal by my interpretation.    COORDINATION OF CARE: 8:21 PM- Pt advised of plan for treatment and pt agrees. Pt will receive lab work, chest x-ray, and EKG for further evaluation.   Labs (all labs ordered are listed, but only abnormal results are displayed) Labs Reviewed  CBC WITH DIFFERENTIAL/PLATELET  COMPREHENSIVE METABOLIC PANEL  TROPONIN I    EKG  EKG Interpretation  Date/Time:  Monday April 02 2016 19:55:44 EDT Ventricular Rate:  69 PR Interval:  126 QRS Duration: 100 QT Interval:  396 QTC Calculation: 424 R Axis:   -9 Text Interpretation:  Normal sinus rhythm Possible Lateral infarct , age undetermined Inferior-posterior infarct , age undetermined Abnormal ECG Confirmed by Aaronmichael Brumbaugh  MD, Rashay Barnette ([id number]) on 04/02/2016 8:06:38 PM       Radiology No results found.  Procedures Procedures (including critical care time)  Medications Ordered in ED Medications - No data to display   Initial Impression / Assessment and Plan / ED Course  Veryl Speak, MD has reviewed the triage vital signs and the nursing notes.  Pertinent labs & imaging results that were available during my care of the patient were reviewed by me and considered in my medical decision making (see chart for details).  Clinical Course    Patient with no prior cardiac history. She presents today with chest pain that has been ongoing for several weeks. It became worse this afternoon. She describes this as a pressure that radiates into her neck. She feels short of breath but denies nausea or diaphoresis.  Her cardiac workup is  negative including troponin despite discomfort all day. Her EKG is unchanged from prior studies. She has an appointment tomorrow morning with cardiology and I feel as though it is appropriate for her to follow-up with them tomorrow.  Final Clinical Impressions(s) / ED Diagnoses   Final diagnoses:  None    New Prescriptions New Prescriptions   No medications on file   I personally performed the services described in this documentation, which was scribed in my presence. The recorded information has been reviewed and is accurate.        Veryl Speak, MD 04/02/16 2202

## 2016-04-02 NOTE — Discharge Instructions (Signed)
Follow-up with your cardiologist tomorrow as scheduled, and return to the ER if your symptoms significantly worsen or change in the meantime.

## 2016-04-02 NOTE — ED Notes (Addendum)
Pt has had similar chest pain for the last several months, but states that it normally goes away.  Today, it was much worse and radiated to her left neck with some nausea.  She has had 2 cardiac caths in the past that were normal and has an appointment with her cardiologist tomorrow.  Pt states the pain is a constant pressure this time.

## 2016-04-02 NOTE — ED Notes (Signed)
MD at bedside. 

## 2016-10-02 ENCOUNTER — Ambulatory Visit: Payer: Medicare Other | Attending: Internal Medicine | Admitting: Physical Therapy

## 2016-10-02 ENCOUNTER — Encounter: Payer: Self-pay | Admitting: Physical Therapy

## 2016-10-02 DIAGNOSIS — M25552 Pain in left hip: Secondary | ICD-10-CM | POA: Diagnosis present

## 2016-10-02 DIAGNOSIS — M25551 Pain in right hip: Secondary | ICD-10-CM | POA: Diagnosis present

## 2016-10-02 DIAGNOSIS — R262 Difficulty in walking, not elsewhere classified: Secondary | ICD-10-CM

## 2016-10-02 NOTE — Therapy (Signed)
[facility] [place], [place], [address] Phone: [phone]   Fax:  [phone]  Physical Therapy Evaluation  Patient Details  Name: Dawn Collins MRN: [id number] Date of Birth: 06-11-1946 Referring Provider: Gerrit Halls  Encounter Date: 10/02/2016      PT End of Session - 10/02/16 1502    Visit Number 1   Date for PT Re-Evaluation 12/02/16   PT Start Time 1435   PT Stop Time 1530   PT Time Calculation (min) 55 min   Activity Tolerance Patient tolerated treatment well   Behavior During Therapy [facility] for tasks assessed/performed      Past Medical History:  Diagnosis Date  . Arthritis    knees, back.  . Asthma    2 weeks cold,no problems now-well controlled  . Bursitis, knee    Bil. Hips, not knee  . Complication of anesthesia    slow to wake up  . Diverticulitis of colon 05-28-11   no current problems  . GERD (gastroesophageal reflux disease)    tx. pantoprazole  . Hypertension   . Multiple thyroid nodules   . Pneumonia   . PONV (postoperative nausea and vomiting)   . Sleep apnea    No cpap now -3 months last due to insurance reasons    Past Surgical History:  Procedure Laterality Date  . ABDOMINAL HYSTERECTOMY     partial hyst  . BREAST BIOPSY     several- all benign  . CARDIAC CATHETERIZATION     10'11  . [place]  . FOOT SURGERY     Bilateral  . KNEE ARTHROSCOPY     right '04  . TOTAL KNEE ARTHROPLASTY  06/04/2011   Procedure: TOTAL KNEE ARTHROPLASTY;  Surgeon: Gearlean Alf;  Location: [facility];  Service: Orthopedics;  Laterality: Right;  . TOTAL KNEE ARTHROPLASTY Left 08/08/2015   Procedure: TOTAL LEFT KNEE ARTHROPLASTY;  Surgeon: Gaynelle Arabian, MD;  Location: [facility];  Service: Orthopedics;  Laterality: Left;    There were no vitals filed for this visit.       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  MD order is for bursitis of the hips, weakness and stretching of the knee and hips   Limitations Sitting;Lifting;Standing;Walking   Patient Stated Goals have less pain   Currently in Pain? Yes   Pain Score 8    Pain Location Hip   Pain Orientation Right;Left  left is worse than the right   Pain Descriptors / Indicators Aching;Tightness   Pain Type Chronic pain   Pain Onset More than a month ago   Pain Frequency Constant   Aggravating Factors  standing and walking, any housework pain will be up 9/10   Pain Relieving Factors tylenol helps, rest helps down to a 4/10   Effect of Pain on Daily Activities limits ability to stand and or walk            [facility] PT Assessment - 10/02/16 0001      Assessment   Medical Diagnosis bilateral hip bursitis   Referring Provider Gerrit Halls   Onset Date/Surgical Date 09/21/16   Prior Therapy for the left TKR  a year ago     Precautions   Precautions None     Balance Screen   Has the patient fallen in the past 6 months No   Has the patient had a decrease in activity level because of a fear of falling?  No   Is the patient reluctant to leave their home because of a fear of falling?  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   Leisure no exercise     AROM   Overall AROM Comments AROM of the hips WFL's but painful with hip abduction and extension     Strength   Overall Strength Comments 4-/5 with a lot of pain for hip abduction     Flexibility   Soft Tissue Assessment /Muscle Length --  very tight HS, calf, piriformis and ITB bilaterally     Palpation   Palpation  comment she is very tight and tender in the ITB from the buttock to the lateral knee, very tender in the lumbar parapsinals     Ambulation/Gait   Gait Comments no device, significant antalgic gait on the left for the first 5 steps then gets a little better but still hurting and still limping     Standardized Balance Assessment   Standardized Balance Assessment Timed Up and Go Test     Timed Up and Go Test   Normal TUG (seconds) 20                   [facility] Adult PT Treatment/Exercise - 10/02/16 0001      Modalities   Modalities Iontophoresis     Moist Heat Therapy   Number Minutes Moist Heat 15 Minutes   Moist Heat Location Hip     Electrical Stimulation   Electrical Stimulation Location left GT area   Electrical Stimulation Action IFC   Electrical Stimulation Parameters supine   Electrical Stimulation Goals Pain     Iontophoresis   Type of Iontophoresis Dexamethasone   Location left GT area   Dose 41m dose   Time 4 hour patch #1                  PT Short Term Goals - 10/02/16 1507      PT SHORT TERM GOAL #1   Title independent with initial HEP   Period Days   Status New           PT Long Term Goals - 10/02/16 1507      PT LONG TERM GOAL #1   Title decrease pain 50%   Time 8   Period Weeks   Status New     PT LONG TERM GOAL #2   Title increase strength of the hips to 4+/5    Time 8   Period Weeks   Status New     PT LONG TERM GOAL #3   Title decrease TUG time to 15 seconds   Time 8   Period Weeks   Status New     PT LONG TERM GOAL #4   Title walk with minimal deviation   Time 8   Period Weeks   Status New               Plan - 10/02/16 1502    Clinical  Impression Statement Patient reports that over the past 6 months she has been having significant hip pain, bilateral, left worse than the right.  She is working 3days a week standing on her feet.  She had a left TKR a year ago.  She is very tender and sore in the GT  and ITB area some in the buttocks.  Her TUG time was 20 seconds, she has significant antalgic gait on the left.   Rehab Potential Good   PT Frequency 2x / week   PT Duration 8 weeks   PT Treatment/Interventions ADLs/Self Care Home Management;Electrical Stimulation;Cryotherapy;Iontophoresis '4mg'$ /ml Dexamethasone;Functional mobility training;Gait training;Ultrasound;Moist Heat;Therapeutic activities;Therapeutic exercise;Neuromuscular re-education;Patient/family education;Manual techniques   PT Next Visit Plan See if todays treatment helps, will have to slowly add exercises as she if very tender and sore with any motions   Consulted and Agree with Plan of Care Patient      Patient will benefit from skilled therapeutic intervention in order to improve the following deficits and impairments:  Abnormal gait, Decreased mobility, Decreased range of motion, Decreased scar mobility, Decreased strength, Increased edema, Difficulty walking, Impaired flexibility, Pain  Visit Diagnosis: Pain in left hip - Plan: PT plan of care cert/re-cert  Pain in right hip - Plan: PT plan of care cert/re-cert  Difficulty in walking, not elsewhere classified - Plan: PT plan of care cert/re-cert      G-Codes - 38/25/05 11/01/1507    Functional Assessment Tool Used (Outpatient Only) foto 69% limitation   Functional Limitation Mobility: Walking and moving around   Mobility: Walking and Moving Around Current Status [phone]) At least 60 percent but less than 80 percent impaired, limited or restricted   Mobility: Walking and Moving Around Goal Status [phone]) At least 40 percent but less than 60 percent impaired, limited or restricted       Problem List Patient Active Problem List   Diagnosis Date Noted  . OA (osteoarthritis) of knee 08/08/2015  . Osteoarthritis of right knee 06/04/2011    Sumner Boast., PT 10/02/2016, 3:13 PM  [place] [address] [place]  [place], [place], [address] Phone: [phone]   Fax:  [phone]  Name: Feliz Herard MRN: [id number] Date of Birth: 1946-03-10

## 2016-10-08 ENCOUNTER — Ambulatory Visit: Payer: Medicare Other | Admitting: Physical Therapy

## 2016-10-08 ENCOUNTER — Encounter: Payer: Self-pay | Admitting: Physical Therapy

## 2016-10-08 DIAGNOSIS — M25551 Pain in right hip: Secondary | ICD-10-CM

## 2016-10-08 DIAGNOSIS — M25552 Pain in left hip: Secondary | ICD-10-CM

## 2016-10-08 DIAGNOSIS — R262 Difficulty in walking, not elsewhere classified: Secondary | ICD-10-CM

## 2016-10-08 NOTE — Therapy (Signed)
[place], [place], [address] Phone: [phone]   Fax:  [phone]  Physical Therapy Treatment  Patient Details  Name: Dawn Collins MRN: [id number] Date of Birth: February 15, 1946 Referring Provider: Gerrit Collins  Encounter Date: 10/08/2016      PT End of Session - 10/08/16 1511    Visit Number 2   Date for PT Re-Evaluation 12/02/16   PT Start Time 1426   PT Stop Time 1530   PT Time Calculation (min) 64 min   Activity Tolerance Patient tolerated treatment well   Behavior During Therapy [facility] for tasks assessed/performed      Past Medical History:  Diagnosis Date  . Arthritis    knees, back.  . Asthma    2 weeks cold,no problems now-well controlled  . Bursitis, knee    Bil. Hips, not knee  . Complication of anesthesia    slow to wake up  . Diverticulitis of colon 05-28-11   no current problems  . GERD (gastroesophageal reflux disease)    tx. pantoprazole  . Hypertension   . Multiple thyroid nodules   . Pneumonia   . PONV (postoperative nausea and vomiting)   . Sleep apnea    No cpap now -3 months last due to insurance reasons    Past Surgical History:  Procedure Laterality Date  . ABDOMINAL HYSTERECTOMY     partial hyst  . BREAST BIOPSY     several- all benign  . CARDIAC CATHETERIZATION     10'11  . Dawn Collins  . FOOT SURGERY     Bilateral  . KNEE ARTHROSCOPY     right '04  . TOTAL KNEE ARTHROPLASTY  06/04/2011   Procedure: TOTAL KNEE ARTHROPLASTY;  Surgeon: Gearlean Alf;  Location: [facility];  Service: Orthopedics;  Laterality: Right;  . TOTAL KNEE ARTHROPLASTY Left 08/08/2015   Procedure: TOTAL LEFT KNEE ARTHROPLASTY;  Surgeon: Dawn Arabian, MD;  Location: [facility];  Service: Orthopedics;  Laterality: Left;    There were no vitals filed for this visit.      Subjective Assessment - 10/08/16 1425    Subjective Pt reports that the patch did help a  little bit   Currently in Pain? Yes   Pain Score 8    Pain Location Back   Pain Orientation Left                         [facility] Adult PT Treatment/Exercise - 10/08/16 0001      Exercises   Exercises Knee/Hip     Knee/Hip Exercises: Stretches   Passive Hamstring Stretch Both;4 reps;10 seconds   Piriformis Stretch 3 reps;Both;10 seconds     Knee/Hip Exercises: Aerobic   Nustep L4 x6 min      Knee/Hip Exercises: Machines for Strengthening   Other Machine fitter presses      Knee/Hip Exercises: Standing   Other Standing Knee Exercises Hip ext 2x10      Knee/Hip Exercises: Seated   Other Seated Knee/Hip Exercises ball squeezes 2x10; Hip abd add green tband 2x15     Knee/Hip Exercises: Supine   Bridges 2 sets;10 reps     Modalities   Modalities Iontophoresis     Moist Heat Therapy   Number Minutes Moist Heat 15 Minutes   Moist Heat Location Hip     Electrical Stimulation   Electrical Stimulation Location left GT area  Electrical Stimulation Action IFC   Electrical Stimulation Parameters supine   Electrical Stimulation Goals Pain     Iontophoresis   Type of Iontophoresis Dexamethasone   Location left GT area   Dose 71m dose   Time 4 hour patch #1                  PT Short Term Goals - 10/02/16 1507      PT SHORT TERM GOAL #1   Title independent with initial HEP   Period Days   Status New           PT Long Term Goals - 10/02/16 1507      PT LONG TERM GOAL #1   Title decrease pain 50%   Time 8   Period Weeks   Status New     PT LONG TERM GOAL #2   Title increase strength of the hips to 4+/5    Time 8   Period Weeks   Status New     PT LONG TERM GOAL #3   Title decrease TUG time to 15 seconds   Time 8   Period Weeks   Status New     PT LONG TERM GOAL #4   Title walk with minimal deviation   Time 8   Period Weeks   Status New               Plan - 10/08/16 1514    Clinical Impression Statement Pt able  to complete all of today's exercises, pt reports that she feels like her pain gas moved to the L side. Pt with some increase I pain when transferring from supine to sit. Pt reports posterior knee pain with supine HS stretch.   Rehab Potential Good   PT Frequency 2x / week   PT Duration 8 weeks   PT Treatment/Interventions ADLs/Self Care Home Management;Electrical Stimulation;Cryotherapy;Iontophoresis '4mg'$ /ml Dexamethasone;Functional mobility training;Gait training;Ultrasound;Moist Heat;Therapeutic activities;Therapeutic exercise;Neuromuscular re-education;Patient/family education;Manual techniques   PT Next Visit Plan See if todays treatment helps, will have to slowly add exercises as she if very tender and sore with any motions      Patient will benefit from skilled therapeutic intervention in order to improve the following deficits and impairments:  Abnormal gait, Decreased mobility, Decreased range of motion, Decreased scar mobility, Decreased strength, Increased edema, Difficulty walking, Impaired flexibility, Pain  Visit Diagnosis: Pain in left hip  Pain in right hip  Difficulty in walking, not elsewhere classified     Problem List Patient Active Problem List   Diagnosis Date Noted  . OA (osteoarthritis) of knee 08/08/2015  . Osteoarthritis of right knee 06/04/2011    RScot Jun3/19/2018, 3:15 PM  [place] [address]Phone: [phone]  Fax:  [phone] Name: LKimala HorneMRN: [id number] of Birth: [birth year]

## 2016-10-09 ENCOUNTER — Ambulatory Visit: Payer: Medicare Other | Admitting: Physical Therapy

## 2016-10-09 ENCOUNTER — Encounter: Payer: Self-pay | Admitting: Physical Therapy

## 2016-10-09 DIAGNOSIS — R262 Difficulty in walking, not elsewhere classified: Secondary | ICD-10-CM

## 2016-10-09 DIAGNOSIS — M25552 Pain in left hip: Secondary | ICD-10-CM | POA: Diagnosis not present

## 2016-10-09 DIAGNOSIS — M25551 Pain in right hip: Secondary | ICD-10-CM

## 2016-10-09 NOTE — Therapy (Signed)
[place], [place], [address] Phone: [phone]   Fax:  [phone]  Physical Therapy Treatment  Patient Details  Name: Dawn Collins MRN: [id number] Date of Birth: 1946/03/09 Referring Provider: Gerrit Halls  Encounter Date: 10/09/2016      PT End of Session - 10/09/16 1642    Visit Number 3   Date for PT Re-Evaluation 12/02/16   PT Start Time 1400   PT Stop Time 1459   PT Time Calculation (min) 59 min   Activity Tolerance Patient tolerated treatment well   Behavior During Therapy [facility] for tasks assessed/performed      Past Medical History:  Diagnosis Date  . Arthritis    knees, back.  . Asthma    2 weeks cold,no problems now-well controlled  . Bursitis, knee    Bil. Hips, not knee  . Complication of anesthesia    slow to wake up  . Diverticulitis of colon 05-28-11   no current problems  . GERD (gastroesophageal reflux disease)    tx. pantoprazole  . Hypertension   . Multiple thyroid nodules   . Pneumonia   . PONV (postoperative nausea and vomiting)   . Sleep apnea    No cpap now -3 months last due to insurance reasons    Past Surgical History:  Procedure Laterality Date  . ABDOMINAL HYSTERECTOMY     partial hyst  . BREAST BIOPSY     several- all benign  . CARDIAC CATHETERIZATION     10'11  . [place]  . FOOT SURGERY     Bilateral  . KNEE ARTHROSCOPY     right '04  . TOTAL KNEE ARTHROPLASTY  06/04/2011   Procedure: TOTAL KNEE ARTHROPLASTY;  Surgeon: Gearlean Alf;  [facility];  Service: Orthopedics;  Laterality: Right;  . TOTAL KNEE ARTHROPLASTY Left 08/08/2015   Procedure: TOTAL LEFT KNEE ARTHROPLASTY;  Surgeon: Gaynelle Arabian, MD;  [facility];  Service: Orthopedics;  Laterality: Left;    There were no vitals filed for this visit.      Subjective Assessment - 10/09/16 1601    Subjective "It felt good yesterday, It not too  bad but it still hurts."   Currently in Pain? Yes   Pain Score 8    Pain Location Back   Pain Orientation Left                         [facility] Adult PT Treatment/Exercise - 10/09/16 0001      Exercises   Exercises Lumbar;Knee/Hip     Lumbar Exercises: Machines for Strengthening   Other Lumbar Machine Exercise Rows and lats 20lb 2x10      Knee/Hip Exercises: Standing   Hip Abduction 1 set;10 reps;Both   Abduction Limitations 2   Hip Extension Both;1 set;10 reps;Knee straight   Extension Limitations 2   Other Standing Knee Exercises standing straight arm pull downs 20lb 2x10      Knee/Hip Exercises: Seated   Sit to Sand 2 sets;10 reps;without UE support     Modalities   Modalities Iontophoresis;Moist Heat;Electrical Stimulation     Moist Heat Therapy   Number Minutes Moist Heat 15 Minutes   Moist Heat Location Lumbar Spine     Electrical Stimulation   Electrical Stimulation Location lumbar spine   Electrical Stimulation Action IFC   Electrical Stimulation Parameters supine   Electrical Stimulation Goals  Pain     Iontophoresis   Type of Iontophoresis Dexamethasone   Location left GT area   Dose 13m dose   Time 4 hour patch #1                  PT Short Term Goals - 10/02/16 1507      PT SHORT TERM GOAL #1   Title independent with initial HEP   Period Days   Status New           PT Long Term Goals - 10/02/16 1507      PT LONG TERM GOAL #1   Title decrease pain 50%   Time 8   Period Weeks   Status New     PT LONG TERM GOAL #2   Title increase strength of the hips to 4+/5    Time 8   Period Weeks   Status New     PT LONG TERM GOAL #3   Title decrease TUG time to 15 seconds   Time 8   Period Weeks   Status New     PT LONG TERM GOAL #4   Title walk with minimal deviation   Time 8   Period Weeks   Status New               Plan - 10/09/16 1644    Clinical Impression Statement Pt reports fatigue with today  activities. Progress to more postural strengthening interventions such as rows, lat pull downs, and standing hip extensions. pt reports that her back did feel a little better after activity.    Rehab Potential Good   PT Frequency 2x / week   PT Duration 8 weeks   PT Treatment/Interventions ADLs/Self Care Home Management;Electrical Stimulation;Cryotherapy;Iontophoresis '4mg'$ /ml Dexamethasone;Functional mobility training;Gait training;Ultrasound;Moist Heat;Therapeutic activities;Therapeutic exercise;Neuromuscular re-education;Patient/family education;Manual techniques   PT Next Visit Plan See if todays treatment helps, will have to slowly add exercises as she if very tender and sore with any motions      Patient will benefit from skilled therapeutic intervention in order to improve the following deficits and impairments:  Abnormal gait, Decreased mobility, Decreased range of motion, Decreased scar mobility, Decreased strength, Increased edema, Difficulty walking, Impaired flexibility, Pain  Visit Diagnosis: Pain in left hip  Pain in right hip  Difficulty in walking, not elsewhere classified     Problem List Patient Active Problem List   Diagnosis Date Noted  . OA (osteoarthritis) of knee 08/08/2015  . Osteoarthritis of right knee 06/04/2011    RScot Jun PTA  10/09/2016, 4:53 PM  [place] [address]Phone: [phone]  Fax:  [phone] Name: LShalimar McclainMRN: [id number] of Birth: [birth year]

## 2016-10-15 ENCOUNTER — Encounter: Payer: Self-pay | Admitting: Physical Therapy

## 2016-10-15 ENCOUNTER — Ambulatory Visit: Payer: Medicare Other | Admitting: Physical Therapy

## 2016-10-15 DIAGNOSIS — M25552 Pain in left hip: Secondary | ICD-10-CM

## 2016-10-15 DIAGNOSIS — R262 Difficulty in walking, not elsewhere classified: Secondary | ICD-10-CM

## 2016-10-15 DIAGNOSIS — M25551 Pain in right hip: Secondary | ICD-10-CM

## 2016-10-15 NOTE — Therapy (Signed)
[place], [place], [address] Phone: [phone]   Fax:  [phone]  Physical Therapy Treatment  Patient Details  Name: Dawn Collins MRN: [id number] Date of Birth: [birth year] Referring Provider: Gerrit Halls  Encounter Date: 10/15/2016      PT End of Session - 10/15/16 1516    Visit Number 4   Date for PT Re-Evaluation 12/02/16   PT Start Time 1430   PT Stop Time 1530   PT Time Calculation (min) 60 min   Activity Tolerance Patient tolerated treatment well   Behavior During Therapy [facility] for tasks assessed/performed      Past Medical History:  Diagnosis Date  . Arthritis    knees, back.  . Asthma    2 weeks cold,no problems now-well controlled  . Bursitis, knee    Bil. Hips, not knee  . Complication of anesthesia    slow to wake up  . Diverticulitis of colon 05-28-11   no current problems  . GERD (gastroesophageal reflux disease)    tx. pantoprazole  . Hypertension   . Multiple thyroid nodules   . Pneumonia   . PONV (postoperative nausea and vomiting)   . Sleep apnea    No cpap now -3 months last due to insurance reasons    Past Surgical History:  Procedure Laterality Date  . ABDOMINAL HYSTERECTOMY     partial hyst  . BREAST BIOPSY     several- all benign  . CARDIAC CATHETERIZATION     10'11  . [place]  . FOOT SURGERY     Bilateral  . KNEE ARTHROSCOPY     right '04  . TOTAL KNEE ARTHROPLASTY  06/04/2011   Procedure: TOTAL KNEE ARTHROPLASTY;  Surgeon: Gearlean Alf;  [facility];  Service: Orthopedics;  Laterality: Right;  . TOTAL KNEE ARTHROPLASTY Left 08/08/2015   Procedure: TOTAL LEFT KNEE ARTHROPLASTY;  Surgeon: Gaynelle Arabian, MD;  [facility];  Service: Orthopedics;  Laterality: Left;    There were no vitals filed for this visit.      Subjective Assessment - 10/15/16 1430    Subjective "Pretty good"   Currently in Pain?  Yes   Pain Score 8    Pain Location Back   Pain Orientation Left                         [facility] Adult PT Treatment/Exercise - 10/15/16 0001      Lumbar Exercises: Stretches   Passive Hamstring Stretch 3 reps;10 seconds   Piriformis Stretch 3 reps;10 seconds     Lumbar Exercises: Machines for Strengthening   Cybex Knee Flexion 25lb 2x10   Other Lumbar Machine Exercise Rows 20lb x10, 15lb x10; Lats 20lb 2x15     Lumbar Exercises: Supine   Ab Set 10 reps;3 seconds   Bridge 20 reps;2 seconds   Straight Leg Raise 5 reps;2 seconds     Knee/Hip Exercises: Aerobic   Nustep L4 x6 min      Modalities   Modalities Iontophoresis;Moist Heat;Electrical Stimulation     Moist Heat Therapy   Number Minutes Moist Heat 15 Minutes   Moist Heat Location Lumbar Spine     Electrical Stimulation   Electrical Stimulation Location lumbar spine   Electrical Stimulation Action IFC   Electrical Stimulation Parameters supinr     Iontophoresis   Type of Iontophoresis Dexamethasone   Location  left GT area   Dose 87m dose   Time 4 hour patch #4                  PT Short Term Goals - 10/02/16 1507      PT SHORT TERM GOAL #1   Title independent with initial HEP   Period Days   Status New           PT Long Term Goals - 10/15/16 1521      PT LONG TERM GOAL #1   Title decrease pain 50%   Status On-going     PT LONG TERM GOAL #2   Title increase strength of the hips to 4+/5    Status On-going     PT LONG TERM GOAL #3   Title decrease TUG time to 15 seconds   Status On-going     PT LONG TERM GOAL #4   Title walk with minimal deviation   Status On-going               Plan - 10/15/16 1516    Clinical Impression Statement Pt able to perform all of today exercises. Pt muscles does fatigue quick with isolation exercises. Pt reports overall fatigue after treatment. Pt does reports that she feels like PT is helping.   Rehab Potential Good   PT  Frequency 2x / week   PT Treatment/Interventions ADLs/Self Care Home Management;Electrical Stimulation;Cryotherapy;Iontophoresis '4mg'$ /ml Dexamethasone;Functional mobility training;Gait training;Ultrasound;Moist Heat;Therapeutic activities;Therapeutic exercise;Neuromuscular re-education;Patient/family education;Manual techniques   PT Next Visit Plan See if today's treatment helps, will have to slowly add exercises as she if very tender and sore with any motions      Patient will benefit from skilled therapeutic intervention in order to improve the following deficits and impairments:  Abnormal gait, Decreased mobility, Decreased range of motion, Decreased scar mobility, Decreased strength, Increased edema, Difficulty walking, Impaired flexibility, Pain  Visit Diagnosis: Pain in left hip  Pain in right hip  Difficulty in walking, not elsewhere classified     Problem List Patient Active Problem List   Diagnosis Date Noted  . OA (osteoarthritis) of knee 08/08/2015  . Osteoarthritis of right knee 06/04/2011    RScot Jun PTA 10/15/2016, 3:21 PM  [place] [address]Phone: [phone]  Fax:  [phone] Name: LLeona AlenMRN: [id number] of Birth: [birth year]

## 2016-10-16 ENCOUNTER — Ambulatory Visit: Payer: Medicare Other | Admitting: Physical Therapy

## 2016-10-16 ENCOUNTER — Encounter: Payer: Self-pay | Admitting: Physical Therapy

## 2016-10-16 DIAGNOSIS — R262 Difficulty in walking, not elsewhere classified: Secondary | ICD-10-CM

## 2016-10-16 DIAGNOSIS — M25551 Pain in right hip: Secondary | ICD-10-CM

## 2016-10-16 DIAGNOSIS — M25552 Pain in left hip: Secondary | ICD-10-CM | POA: Diagnosis not present

## 2016-10-16 NOTE — Therapy (Signed)
[place], [place], [address] Phone: [phone]   Fax:  [phone]  Physical Therapy Treatment  Patient Details  Name: Dawn Collins MRN: [id number] Date of Birth: February 21, 1946 Referring Provider: Gerrit Halls  Encounter Date: 10/16/2016      PT End of Session - 10/16/16 1521    Visit Number 5   Date for PT Re-Evaluation 12/02/16   PT Start Time 1430   PT Stop Time 1533   PT Time Calculation (min) 63 min   Activity Tolerance Patient tolerated treatment well   Behavior During Therapy [facility] for tasks assessed/performed      Past Medical History:  Diagnosis Date  . Arthritis    knees, back.  . Asthma    2 weeks cold,no problems now-well controlled  . Bursitis, knee    Bil. Hips, not knee  . Complication of anesthesia    slow to wake up  . Diverticulitis of colon 05-28-11   no current problems  . GERD (gastroesophageal reflux disease)    tx. pantoprazole  . Hypertension   . Multiple thyroid nodules   . Pneumonia   . PONV (postoperative nausea and vomiting)   . Sleep apnea    No cpap now -3 months last due to insurance reasons    Past Surgical History:  Procedure Laterality Date  . ABDOMINAL HYSTERECTOMY     partial hyst  . BREAST BIOPSY     several- all benign  . CARDIAC CATHETERIZATION     10'11  . [place]  . FOOT SURGERY     Bilateral  . KNEE ARTHROSCOPY     right '04  . TOTAL KNEE ARTHROPLASTY  06/04/2011   Procedure: TOTAL KNEE ARTHROPLASTY;  Surgeon: Gearlean Alf;  [facility];  Service: Orthopedics;  Laterality: Right;  . TOTAL KNEE ARTHROPLASTY Left 08/08/2015   Procedure: TOTAL LEFT KNEE ARTHROPLASTY;  Surgeon: Gaynelle Arabian, MD;  [facility];  Service: Orthopedics;  Laterality: Left;    There were no vitals filed for this visit.      Subjective Assessment - 10/16/16 1434    Subjective pt reports taking 2 tylenol before  therapy today   Currently in Pain? Yes   Pain Score 6    Pain Location Back                         [facility] Adult PT Treatment/Exercise - 10/16/16 0001      Lumbar Exercises: Stretches   Passive Hamstring Stretch 5 reps;10 seconds   Single Knee to Chest Stretch 2 reps;10 seconds   Double Knee to Chest Stretch 1 rep;10 seconds   Lower Trunk Rotation 3 reps;10 seconds   Piriformis Stretch 3 reps;10 seconds     Lumbar Exercises: Machines for Strengthening   Cybex Knee Flexion 25lb 2x10   Other Lumbar Machine Exercise Rows 15lb 2x10     Lumbar Exercises: Supine   Bridge 20 reps;2 seconds     Knee/Hip Exercises: Aerobic   Nustep L4 x6 min      Knee/Hip Exercises: Supine   Other Supine Knee/Hip Exercises Hook lying hip abd blue tband Proofreader Location L GT, R lumbar spine   Electrical Stimulation Action premod   Electrical Stimulation Parameters supine   Electrical Stimulation Goals Pain     Iontophoresis   Type of  Iontophoresis Dexamethasone   Location left GT area   Dose 67m dose   Time 4 hour patch #5     Manual Therapy   Manual Therapy Soft tissue mobilization   Soft tissue mobilization ITB stripping foam roll                   PT Short Term Goals - 10/02/16 1507      PT SHORT TERM GOAL #1   Title independent with initial HEP   Period Days   Status New           PT Long Term Goals - 10/15/16 1521      PT LONG TERM GOAL #1   Title decrease pain 50%   Status On-going     PT LONG TERM GOAL #2   Title increase strength of the hips to 4+/5    Status On-going     PT LONG TERM GOAL #3   Title decrease TUG time to 15 seconds   Status On-going     PT LONG TERM GOAL #4   Title walk with minimal deviation   Status On-going               Plan - 10/16/16 1522    Clinical Impression Statement Positive response to MT and LE stretching. Fatigues with exercises, reports no  increase I pain.   Rehab Potential Good   PT Frequency 2x / week   PT Duration 8 weeks   PT Treatment/Interventions ADLs/Self Care Home Management;Electrical Stimulation;Cryotherapy;Iontophoresis '4mg'$ /ml Dexamethasone;Functional mobility training;Gait training;Ultrasound;Moist Heat;Therapeutic activities;Therapeutic exercise;Neuromuscular re-education;Patient/family education;Manual techniques   PT Next Visit Plan LE stretching, MT to L ITB      Patient will benefit from skilled therapeutic intervention in order to improve the following deficits and impairments:  Abnormal gait, Decreased mobility, Decreased range of motion, Decreased scar mobility, Decreased strength, Increased edema, Difficulty walking, Impaired flexibility, Pain  Visit Diagnosis: Pain in left hip  Pain in right hip  Difficulty in walking, not elsewhere classified     Problem List Patient Active Problem List   Diagnosis Date Noted  . OA (osteoarthritis) of knee 08/08/2015  . Osteoarthritis of right knee 06/04/2011    RScot Jun3/27/2018, 3:25 PM  [place] [address]Phone: [phone]  Fax:  [phone] Name: Dawn KulinskiMRN: [id number] of Birth: [birth year]

## 2016-10-23 ENCOUNTER — Ambulatory Visit: Payer: Medicare Other | Admitting: Physical Therapy

## 2016-10-24 ENCOUNTER — Ambulatory Visit: Payer: Medicare Other | Admitting: Physical Therapy

## 2016-10-29 ENCOUNTER — Ambulatory Visit: Payer: Medicare Other | Attending: Internal Medicine | Admitting: Physical Therapy

## 2016-10-29 ENCOUNTER — Encounter: Payer: Self-pay | Admitting: Physical Therapy

## 2016-10-29 DIAGNOSIS — M25552 Pain in left hip: Secondary | ICD-10-CM | POA: Insufficient documentation

## 2016-10-29 DIAGNOSIS — R262 Difficulty in walking, not elsewhere classified: Secondary | ICD-10-CM | POA: Diagnosis present

## 2016-10-29 DIAGNOSIS — M25551 Pain in right hip: Secondary | ICD-10-CM | POA: Diagnosis present

## 2016-10-29 NOTE — Therapy (Signed)
[place], [place], [address] Phone: [phone]   Fax:  [phone]  Physical Therapy Treatment  Patient Details  Name: Dawn Collins MRN: [id number] Date of Birth: 1945/10/16 Referring Provider: Gerrit Halls  Encounter Date: 10/29/2016      PT End of Session - 10/29/16 1603    Visit Number 6   PT Start Time 3976   PT Stop Time 1603   PT Time Calculation (min) 48 min   Activity Tolerance Patient tolerated treatment well   Behavior During Therapy [facility] for tasks assessed/performed      Past Medical History:  Diagnosis Date  . Arthritis    knees, back.  . Asthma    2 weeks cold,no problems now-well controlled  . Bursitis, knee    Bil. Hips, not knee  . Complication of anesthesia    slow to wake up  . Diverticulitis of colon 05-28-11   no current problems  . GERD (gastroesophageal reflux disease)    tx. pantoprazole  . Hypertension   . Multiple thyroid nodules   . Pneumonia   . PONV (postoperative nausea and vomiting)   . Sleep apnea    No cpap now -3 months last due to insurance reasons    Past Surgical History:  Procedure Laterality Date  . ABDOMINAL HYSTERECTOMY     partial hyst  . BREAST BIOPSY     several- all benign  . CARDIAC CATHETERIZATION     10'11  . [place]  . FOOT SURGERY     Bilateral  . KNEE ARTHROSCOPY     right '04  . TOTAL KNEE ARTHROPLASTY  06/04/2011   Procedure: TOTAL KNEE ARTHROPLASTY;  Surgeon: Gearlean Alf;  [facility];  Service: Orthopedics;  Laterality: Right;  . TOTAL KNEE ARTHROPLASTY Left 08/08/2015   Procedure: TOTAL LEFT KNEE ARTHROPLASTY;  Surgeon: Gaynelle Arabian, MD;  [facility];  Service: Orthopedics;  Laterality: Left;    There were no vitals filed for this visit.      Subjective Assessment - 10/29/16 1519    Subjective Pt reports that she had a allergic reaction to the ionto patch. Pt reports  that it made her legs swell up. Pt reports that sometimes her back feels good sometimes it does not.   Currently in Pain? Yes   Pain Score 8    Pain Location Back   Pain Orientation Left                         [facility] Adult PT Treatment/Exercise - 10/29/16 0001      Lumbar Exercises: Machines for Strengthening   Cybex Knee Extension 5lb2x10    Cybex Knee Flexion 20lb 2x10     Lumbar Exercises: Standing   Row 20 reps;Both;Theraband   Theraband Level (Row) Level 2 (Red)     Modalities   Modalities Ultrasound     Ultrasound   Ultrasound Location L GT   Ultrasound Parameters 1MHz 1.0w/cm2    Ultrasound Goals Edema;Pain     Manual Therapy   Manual Therapy Soft tissue mobilization   Soft tissue mobilization ITB stripping foam roll                   PT Short Term Goals - 10/02/16 1507      PT SHORT TERM GOAL #1   Title independent with initial HEP   Period  Days   Status New           PT Long Term Goals - 10/15/16 1521      PT LONG TERM GOAL #1   Title decrease pain 50%   Status On-going     PT LONG TERM GOAL #2   Title increase strength of the hips to 4+/5    Status On-going     PT LONG TERM GOAL #3   Title decrease TUG time to 15 seconds   Status On-going     PT LONG TERM GOAL #4   Title walk with minimal deviation   Status On-going               Plan - 10/29/16 1603    Clinical Impression Statement Pt with a decrease activity tolerance, fatigues quick requiring multiple rest breaks. Discontinues ionto patch due to pt c/o being allergic to it. some tenderness during MT. Attempted UN on L GT   Rehab Potential Good   PT Frequency 2x / week   PT Duration 8 weeks   PT Next Visit Plan LE stretching, MT to L ITB      Patient will benefit from skilled therapeutic intervention in order to improve the following deficits and impairments:  Abnormal gait, Decreased mobility, Decreased range of motion, Decreased scar mobility,  Decreased strength, Increased edema, Difficulty walking, Impaired flexibility, Pain  Visit Diagnosis: Pain in left hip  Difficulty in walking, not elsewhere classified     Problem List Patient Active Problem List   Diagnosis Date Noted  . OA (osteoarthritis) of knee 08/08/2015  . Osteoarthritis of right knee 06/04/2011    Scot Jun, PTA 10/29/2016, 4:07 PM  [place], [place], [address] Phone: [phone]   Fax:  [phone]  Name: Dawn Collins MRN: [id number] Date of Birth: [birth year]

## 2016-10-30 ENCOUNTER — Ambulatory Visit: Payer: Medicare Other | Admitting: Physical Therapy

## 2016-10-30 ENCOUNTER — Encounter: Payer: Self-pay | Admitting: Physical Therapy

## 2016-10-30 DIAGNOSIS — M25551 Pain in right hip: Secondary | ICD-10-CM

## 2016-10-30 DIAGNOSIS — R262 Difficulty in walking, not elsewhere classified: Secondary | ICD-10-CM

## 2016-10-30 DIAGNOSIS — M25552 Pain in left hip: Secondary | ICD-10-CM

## 2016-10-30 NOTE — Therapy (Signed)
[place], [place], [address] Phone: [phone]   Fax:  [phone]  Physical Therapy Treatment  Patient Details  Name: Dawn Collins MRN: [id number] Date of Birth: April 30, 1946 Referring Provider: Gerrit Halls  Encounter Date: 10/30/2016      PT End of Session - 10/30/16 1605    Visit Number 7   Date for PT Re-Evaluation 12/02/16   PT Start Time 9024   PT Stop Time 1605   PT Time Calculation (min) 50 min   Activity Tolerance Patient tolerated treatment well   Behavior During Therapy [facility] for tasks assessed/performed      Past Medical History:  Diagnosis Date  . Arthritis    knees, back.  . Asthma    2 weeks cold,no problems now-well controlled  . Bursitis, knee    Bil. Hips, not knee  . Complication of anesthesia    slow to wake up  . Diverticulitis of colon 05-28-11   no current problems  . GERD (gastroesophageal reflux disease)    tx. pantoprazole  . Hypertension   . Multiple thyroid nodules   . Pneumonia   . PONV (postoperative nausea and vomiting)   . Sleep apnea    No cpap now -3 months last due to insurance reasons    Past Surgical History:  Procedure Laterality Date  . ABDOMINAL HYSTERECTOMY     partial hyst  . BREAST BIOPSY     several- all benign  . CARDIAC CATHETERIZATION     10'11  . [place]  . FOOT SURGERY     Bilateral  . KNEE ARTHROSCOPY     right '04  . TOTAL KNEE ARTHROPLASTY  06/04/2011   Procedure: TOTAL KNEE ARTHROPLASTY;  Surgeon: Gearlean Alf;  [facility];  Service: Orthopedics;  Laterality: Right;  . TOTAL KNEE ARTHROPLASTY Left 08/08/2015   Procedure: TOTAL LEFT KNEE ARTHROPLASTY;  Surgeon: Gaynelle Arabian, MD;  [facility];  Service: Orthopedics;  Laterality: Left;    There were no vitals filed for this visit.      Subjective Assessment - 10/30/16 1511    Subjective "Pretty good cant complain"   Currently in Pain? Yes   Pain Score 6    Pain Location Back  L hip                         [facility] Adult PT Treatment/Exercise - 10/30/16 0001      Lumbar Exercises: Machines for Strengthening   Cybex Knee Flexion 20lb 2x10   Leg Press 20lb 2x10     Lumbar Exercises: Seated   Sit to Stand 10 reps  no UE     Lumbar Exercises: Supine   Bridge 2 seconds;15 reps   Straight Leg Raise 5 reps;2 seconds     Knee/Hip Exercises: Stretches   Passive Hamstring Stretch Both;4 reps;10 seconds     Knee/Hip Exercises: Aerobic   Nustep L4 x6 min      Modalities   Modalities Ultrasound     Ultrasound   Ultrasound Location L Gt   Ultrasound Parameters 1MHz 1.0w/cm2   Ultrasound Goals Pain;Edema     Manual Therapy   Manual Therapy Soft tissue mobilization   Soft tissue mobilization ITB stripping foam roll                   PT Short Term Goals - 10/02/16 1507  PT SHORT TERM GOAL #1   Title independent with initial HEP   Period Days   Status New           PT Long Term Goals - 10/15/16 1521      PT LONG TERM GOAL #1   Title decrease pain 50%   Status On-going     PT LONG TERM GOAL #2   Title increase strength of the hips to 4+/5    Status On-going     PT LONG TERM GOAL #3   Title decrease TUG time to 15 seconds   Status On-going     PT LONG TERM GOAL #4   Title walk with minimal deviation   Status On-going               Plan - 10/30/16 1605    Clinical Impression Statement Pt continues to fatigue quick with activity. Pt with less tenderness in lateral L thigh with foam rolling. Tolerated all machine level interventions well. Reports LE soreness with passive HS stretching.   Rehab Potential Good   PT Frequency 2x / week   PT Duration 8 weeks   PT Treatment/Interventions ADLs/Self Care Home Management;Electrical Stimulation;Cryotherapy;Iontophoresis '4mg'$ /ml Dexamethasone;Functional mobility training;Gait training;Ultrasound;Moist  Heat;Therapeutic activities;Therapeutic exercise;Neuromuscular re-education;Patient/family education;Manual techniques   PT Next Visit Plan LE stretching, MT to L ITB      Patient will benefit from skilled therapeutic intervention in order to improve the following deficits and impairments:  Abnormal gait, Decreased mobility, Decreased range of motion, Decreased scar mobility, Decreased strength, Increased edema, Difficulty walking, Impaired flexibility, Pain  Visit Diagnosis: Pain in left hip  Difficulty in walking, not elsewhere classified  Pain in right hip     Problem List Patient Active Problem List   Diagnosis Date Noted  . OA (osteoarthritis) of knee 08/08/2015  . Osteoarthritis of right knee 06/04/2011    Scot Jun, PTA 10/30/2016, 4:15 PM  [place], [place], [address] Phone: [phone]   Fax:  [phone]  Name: Dawn Collins MRN: [id number] Date of Birth: May 23, 1946

## 2016-11-06 ENCOUNTER — Ambulatory Visit: Payer: Medicare Other | Admitting: Physical Therapy

## 2016-11-06 ENCOUNTER — Encounter: Payer: Self-pay | Admitting: Physical Therapy

## 2016-11-06 DIAGNOSIS — M25552 Pain in left hip: Secondary | ICD-10-CM | POA: Diagnosis not present

## 2016-11-06 DIAGNOSIS — M25551 Pain in right hip: Secondary | ICD-10-CM

## 2016-11-06 DIAGNOSIS — R262 Difficulty in walking, not elsewhere classified: Secondary | ICD-10-CM

## 2016-11-06 NOTE — Therapy (Signed)
[place], [place], [address] Phone: [phone]   Fax:  [phone]  Physical Therapy Treatment  Patient Details  Name: Dawn Collins MRN: [id number] Date of Birth: 06/27/1946 Referring Provider: Gerrit Halls  Encounter Date: 11/06/2016      PT End of Session - 11/06/16 1426    Visit Number 8   Date for PT Re-Evaluation 12/02/16   PT Start Time 1345   PT Stop Time 1438   PT Time Calculation (min) 53 min   Activity Tolerance No increased pain   Behavior During Therapy [facility] for tasks assessed/performed      Past Medical History:  Diagnosis Date  . Arthritis    knees, back.  . Asthma    2 weeks cold,no problems now-well controlled  . Bursitis, knee    Bil. Hips, not knee  . Complication of anesthesia    slow to wake up  . Diverticulitis of colon 05-28-11   no current problems  . GERD (gastroesophageal reflux disease)    tx. pantoprazole  . Hypertension   . Multiple thyroid nodules   . Pneumonia   . PONV (postoperative nausea and vomiting)   . Sleep apnea    No cpap now -3 months last due to insurance reasons    Past Surgical History:  Procedure Laterality Date  . ABDOMINAL HYSTERECTOMY     partial hyst  . BREAST BIOPSY     several- all benign  . CARDIAC CATHETERIZATION     10'11  . [place]  . FOOT SURGERY     Bilateral  . KNEE ARTHROSCOPY     right '04  . TOTAL KNEE ARTHROPLASTY  06/04/2011   Procedure: TOTAL KNEE ARTHROPLASTY;  Surgeon: Gearlean Alf;  Location: [facility];  Service: Orthopedics;  Laterality: Right;  . TOTAL KNEE ARTHROPLASTY Left 08/08/2015   Procedure: TOTAL LEFT KNEE ARTHROPLASTY;  Surgeon: Gaynelle Arabian, MD;  Location: [facility];  Service: Orthopedics;  Laterality: Left;    There were no vitals filed for this visit.      Subjective Assessment - 11/06/16 1343    Subjective "Not going to good"   Currently in Pain? Yes   Pain Score 10-Worst pain ever   Pain Location Back                         [facility] Adult PT Treatment/Exercise - 11/06/16 0001      Lumbar Exercises: Stretches   ITB Stretch 3 reps;10 seconds     Lumbar Exercises: Supine   Clam 15 reps;2 seconds  2x10   Bridge 2 seconds;15 reps     Knee/Hip Exercises: Stretches   Passive Hamstring Stretch Both;4 reps;10 seconds     Knee/Hip Exercises: Aerobic   Nustep L4 x6 min      Modalities   Modalities Ultrasound;Social worker Location L GT, R lumbar spine   Electrical Stimulation Action IFC   Electrical Stimulation Parameters supine   Electrical Stimulation Goals Pain     Ultrasound   Ultrasound Location L Gt   Ultrasound Parameters 1MHz 1w/cm2   Ultrasound Goals Pain;Edema                  PT Short Term Goals - 11/06/16 1431      PT SHORT TERM GOAL #1   Title independent with initial  HEP   Status Achieved           PT Long Term Goals - 11/06/16 1431      PT LONG TERM GOAL #1   Title decrease pain 50%   Status On-going     PT LONG TERM GOAL #2   Title increase strength of the hips to 4+/5    Status On-going     PT LONG TERM GOAL #3   Title decrease TUG time to 15 seconds   Status On-going     PT LONG TERM GOAL #4   Title walk with minimal deviation   Status Partially Met               Plan - 11/06/16 1427    Clinical Impression Statement Pt enters clinic reporting increase pain. Continues to have soreness with passive HS stretching. Tender L GT with palpation. Less intense treatment session due to pt complaints.   Rehab Potential Good   PT Frequency 2x / week   PT Duration 8 weeks   PT Treatment/Interventions ADLs/Self Care Home Management;Electrical Stimulation;Cryotherapy;Iontophoresis '4mg'$ /ml Dexamethasone;Functional mobility training;Gait training;Ultrasound;Moist Heat;Therapeutic activities;Therapeutic  exercise;Neuromuscular re-education;Patient/family education;Manual techniques   PT Next Visit Plan LE stretching, MT to L ITB      Patient will benefit from skilled therapeutic intervention in order to improve the following deficits and impairments:  Abnormal gait, Decreased mobility, Decreased range of motion, Decreased scar mobility, Decreased strength, Increased edema, Difficulty walking, Impaired flexibility, Pain  Visit Diagnosis: Pain in left hip  Difficulty in walking, not elsewhere classified  Pain in right hip     Problem List Patient Active Problem List   Diagnosis Date Noted  . OA (osteoarthritis) of knee 08/08/2015  . Osteoarthritis of right knee 06/04/2011    Scot Jun 11/06/2016, 2:32 PM  [place], [place], [address] Phone: [phone]   Fax:  [phone]  Name: Mirielle Byrum MRN: [id number] Date of Birth: 23-Aug-1945

## 2016-11-12 ENCOUNTER — Ambulatory Visit: Payer: Medicare Other | Admitting: Physical Therapy

## 2016-11-13 ENCOUNTER — Encounter: Payer: Self-pay | Admitting: Physical Therapy

## 2016-11-13 ENCOUNTER — Ambulatory Visit: Payer: Medicare Other | Admitting: Physical Therapy

## 2016-11-13 DIAGNOSIS — M25551 Pain in right hip: Secondary | ICD-10-CM

## 2016-11-13 DIAGNOSIS — R262 Difficulty in walking, not elsewhere classified: Secondary | ICD-10-CM

## 2016-11-13 DIAGNOSIS — M25552 Pain in left hip: Secondary | ICD-10-CM

## 2016-11-13 NOTE — Therapy (Signed)
[address] [place], [place], [address] Phone: [phone]   Fax:  [phone]  Physical Therapy Treatment  Patient Details  Name: Dawn Collins MRN: [id number] Date of Birth: 08-30-1945 Referring Provider: Gerrit Halls  Encounter Date: 11/13/2016      PT End of Session - 11/13/16 1422    Visit Number 9   Date for PT Re-Evaluation 12/02/16   PT Start Time 4503   PT Stop Time 1433   PT Time Calculation (min) 48 min   Activity Tolerance No increased pain;Patient tolerated treatment well   Behavior During Therapy [facility] for tasks assessed/performed      Past Medical History:  Diagnosis Date  . Arthritis    knees, back.  . Asthma    2 weeks cold,no problems now-well controlled  . Bursitis, knee    Bil. Hips, not knee  . Complication of anesthesia    slow to wake up  . Diverticulitis of colon 05-28-11   no current problems  . GERD (gastroesophageal reflux disease)    tx. pantoprazole  . Hypertension   . Multiple thyroid nodules   . Pneumonia   . PONV (postoperative nausea and vomiting)   . Sleep apnea    No cpap now -3 months last due to insurance reasons    Past Surgical History:  Procedure Laterality Date  . ABDOMINAL HYSTERECTOMY     partial hyst  . BREAST BIOPSY     several- all benign  . CARDIAC CATHETERIZATION     10'11  . [place]  . FOOT SURGERY     Bilateral  . KNEE ARTHROSCOPY     right '04  . TOTAL KNEE ARTHROPLASTY  06/04/2011   Procedure: TOTAL KNEE ARTHROPLASTY;  Surgeon: Gearlean Alf;  [facility];  Service: Orthopedics;  Laterality: Right;  . TOTAL KNEE ARTHROPLASTY Left 08/08/2015   Procedure: TOTAL LEFT KNEE ARTHROPLASTY;  Surgeon: Gaynelle Arabian, MD;  Location[facility];  Service: Orthopedics;  Laterality: Left;    There were no vitals filed for this visit.                       [facility] Adult PT Treatment/Exercise -  11/13/16 0001      Knee/Hip Exercises: Aerobic   Nustep L4 x 12 min      Electrical Stimulation   Electrical Stimulation Location L GT, R lumbar spine   Electrical Stimulation Action IFC   Electrical Stimulation Parameters supine   Electrical Stimulation Goals Pain     Ultrasound   Ultrasound Location L Gt   Ultrasound Parameters 1MHz 1w/cm2   Ultrasound Goals Pain;Edema                  PT Short Term Goals - 11/06/16 1431      PT SHORT TERM GOAL #1   Title independent with initial HEP   Status Achieved           PT Long Term Goals - 11/06/16 1431      PT LONG TERM GOAL #1   Title decrease pain 50%   Status On-going     PT LONG TERM GOAL #2   Title increase strength of the hips to 4+/5    Status On-going     PT LONG TERM GOAL #3   Title decrease TUG time to 15 seconds   Status On-going     PT  LONG TERM GOAL #4   Title walk with minimal deviation   Status Partially Met               Plan - 11/13/16 1425    Clinical Impression Statement Pt reports that she has felt the best since last treatment so interventions remained the same. Used modalities for pain.    Rehab Potential Good   PT Frequency 2x / week   PT Duration 8 weeks   PT Treatment/Interventions ADLs/Self Care Home Management;Electrical Stimulation;Cryotherapy;Iontophoresis 23m/ml Dexamethasone;Functional mobility training;Gait training;Ultrasound;Moist Heat;Therapeutic activities;Therapeutic exercise;Neuromuscular re-education;Patient/family education;Manual techniques   PT Next Visit Plan LE stretching, MT to L ITB      Patient will benefit from skilled therapeutic intervention in order to improve the following deficits and impairments:  Abnormal gait, Decreased mobility, Decreased range of motion, Decreased scar mobility, Decreased strength, Increased edema, Difficulty walking, Impaired flexibility, Pain  Visit Diagnosis: Pain in left hip  Difficulty in walking, not elsewhere  classified  Pain in right hip     Problem List Patient Active Problem List   Diagnosis Date Noted  . OA (osteoarthritis) of knee 08/08/2015  . Osteoarthritis of right knee 06/04/2011    RScot Jun PTA 11/13/2016, 2:29 PM  [place] [address]Phone: [phone]  Fax:  [phone] Name: Dawn KorandaMRN: [id number] of Birth: [birth year]

## 2016-11-19 ENCOUNTER — Ambulatory Visit: Payer: Medicare Other | Admitting: Physical Therapy

## 2016-11-19 ENCOUNTER — Encounter: Payer: Self-pay | Admitting: Physical Therapy

## 2016-11-19 DIAGNOSIS — R262 Difficulty in walking, not elsewhere classified: Secondary | ICD-10-CM

## 2016-11-19 DIAGNOSIS — M25552 Pain in left hip: Secondary | ICD-10-CM | POA: Diagnosis not present

## 2016-11-19 NOTE — Therapy (Addendum)
[place], [place], [address] Phone: [phone]   Fax:  [phone]  Physical Therapy Treatment  Patient Details  Name: Dawn Collins MRN: [id number] Date of Birth: July 13, 1946 Referring Provider: Gerrit Halls  Encounter Date: 11/19/2016      PT End of Session - 11/19/16 1638    Visit Number 10   Date for PT Re-Evaluation 12/02/16   PT Start Time 6195   PT Stop Time 1648   PT Time Calculation (min) 57 min   Activity Tolerance No increased pain;Patient tolerated treatment well   Behavior During Therapy [facility] for tasks assessed/performed      Past Medical History:  Diagnosis Date  . Arthritis    knees, back.  . Asthma    2 weeks cold,no problems now-well controlled  . Bursitis, knee    Bil. Hips, not knee  . Complication of anesthesia    slow to wake up  . Diverticulitis of colon 05-28-11   no current problems  . GERD (gastroesophageal reflux disease)    tx. pantoprazole  . Hypertension   . Multiple thyroid nodules   . Pneumonia   . PONV (postoperative nausea and vomiting)   . Sleep apnea    No cpap now -3 months last due to insurance reasons    Past Surgical History:  Procedure Laterality Date  . ABDOMINAL HYSTERECTOMY     partial hyst  . BREAST BIOPSY     several- all benign  . CARDIAC CATHETERIZATION     10'11  . [place]  . FOOT SURGERY     Bilateral  . KNEE ARTHROSCOPY     right '04  . TOTAL KNEE ARTHROPLASTY  06/04/2011   Procedure: TOTAL KNEE ARTHROPLASTY;  Surgeon: Gearlean Alf;  [facility];  Service: Orthopedics;  Laterality: Right;  . TOTAL KNEE ARTHROPLASTY Left 08/08/2015   Procedure: TOTAL LEFT KNEE ARTHROPLASTY;  Surgeon: Gaynelle Arabian, MD;  [facility];  Service: Orthopedics;  Laterality: Left;    There were no vitals filed for this visit.      Subjective Assessment - 11/19/16 1552    Subjective "It has been doing  pretty good" " can tell it has helped a little bit, but I still have to take my medicine"   Currently in Pain? Yes   Pain Score 4    Pain Location Back   Pain Orientation Left                         [facility] Adult PT Treatment/Exercise - 11/19/16 0001      Knee/Hip Exercises: Aerobic   Nustep L4 x 12 min      Modalities   Modalities Ultrasound;Associate Professor IFC   Electrical Stimulation Parameters sidelying   Electrical Stimulation Goals Pain     Ultrasound   Ultrasound Location L GT   Ultrasound Parameters 1MHz 1.1w/cm2   Ultrasound Goals Pain;Edema     Manual Therapy   Manual Therapy Soft tissue mobilization   Soft tissue mobilization ITB stripping foam roll                   PT Short Term Goals - 11/06/16 1431      PT SHORT TERM GOAL #1   Title independent with initial HEP  Status Achieved           PT Long Term Goals - 11/06/16 1431      PT LONG TERM GOAL #1   Title decrease pain 50%   Status On-going     PT LONG TERM GOAL #2   Title increase strength of the hips to 4+/5    Status On-going     PT LONG TERM GOAL #3   Title decrease TUG time to 15 seconds   Status On-going     PT LONG TERM GOAL #4   Title walk with minimal deviation   Status Partially Met               Plan - 11/19/16 1640    Clinical Impression Statement Pt enters clinic reporting a continues positive response to last treatment interventions. Attempted some passive HS stretching, reports pain in L HS when stretching. Some tenderness in L ITB and glute area with foam rolling.   Rehab Potential Good   PT Frequency 2x / week   PT Duration 8 weeks   PT Treatment/Interventions ADLs/Self Care Home Management;Electrical Stimulation;Cryotherapy;Iontophoresis '4mg'$ /ml Dexamethasone;Functional mobility training;Gait training;Ultrasound;Moist  Heat;Therapeutic activities;Therapeutic exercise;Neuromuscular re-education;Patient/family education;Manual techniques   PT Next Visit Plan LE stretching, MT to L ITB      Patient will benefit from skilled therapeutic intervention in order to improve the following deficits and impairments:  Abnormal gait, Decreased mobility, Decreased range of motion, Decreased scar mobility, Decreased strength, Increased edema, Difficulty walking, Impaired flexibility, Pain  Visit Diagnosis: Pain in left hip  Difficulty in walking, not elsewhere classified     Problem List Patient Active Problem List   Diagnosis Date Noted  . OA (osteoarthritis) of knee 08/08/2015  . Osteoarthritis of right knee 06/04/2011    PHYSICAL THERAPY DISCHARGE SUMMARY  Visits from Start of Care: 10  Plan: Patient agrees to discharge.  Patient goals were partially met. Patient is being discharged due to being pleased with the current functional level.  ?????     Scot Jun, PTA 11/19/2016, 4:43 PM  [address] [place], [place], [address] Phone: [phone]   Fax:  [phone]  Name: Dayleen Beske MRN: [id number] Date of Birth: 03-17-1946

## 2016-11-20 ENCOUNTER — Ambulatory Visit: Payer: Medicare Other | Admitting: Physical Therapy

## 2016-11-26 ENCOUNTER — Ambulatory Visit: Payer: Medicare Other | Admitting: Physical Therapy

## 2016-11-27 ENCOUNTER — Ambulatory Visit: Payer: Medicare Other | Admitting: Physical Therapy

## 2018-05-24 ENCOUNTER — Encounter (HOSPITAL_BASED_OUTPATIENT_CLINIC_OR_DEPARTMENT_OTHER): Payer: Self-pay | Admitting: Emergency Medicine

## 2018-05-24 ENCOUNTER — Emergency Department (HOSPITAL_BASED_OUTPATIENT_CLINIC_OR_DEPARTMENT_OTHER): Payer: Medicare Other

## 2018-05-24 ENCOUNTER — Other Ambulatory Visit: Payer: Self-pay

## 2018-05-24 ENCOUNTER — Emergency Department (HOSPITAL_BASED_OUTPATIENT_CLINIC_OR_DEPARTMENT_OTHER)
Admission: EM | Admit: 2018-05-24 | Discharge: 2018-05-24 | Disposition: A | Discharge status: Home / Self Care | Payer: Medicare Other | Attending: Emergency Medicine | Admitting: Emergency Medicine

## 2018-05-24 DIAGNOSIS — J209 Acute bronchitis, unspecified: Secondary | ICD-10-CM | POA: Diagnosis not present

## 2018-05-24 DIAGNOSIS — R0602 Shortness of breath: Secondary | ICD-10-CM | POA: Diagnosis present

## 2018-05-24 DIAGNOSIS — Z79899 Other long term (current) drug therapy: Secondary | ICD-10-CM | POA: Insufficient documentation

## 2018-05-24 DIAGNOSIS — J45909 Unspecified asthma, uncomplicated: Secondary | ICD-10-CM | POA: Insufficient documentation

## 2018-05-24 DIAGNOSIS — Z7901 Long term (current) use of anticoagulants: Secondary | ICD-10-CM | POA: Insufficient documentation

## 2018-05-24 DIAGNOSIS — I10 Essential (primary) hypertension: Secondary | ICD-10-CM | POA: Insufficient documentation

## 2018-05-24 MED ORDER — AEROCHAMBER PLUS FLO-VU MEDIUM MISC
1.0000 | Freq: Once | Status: AC
  Administered 2018-05-24: 1
  Filled 2018-05-24: qty 1

## 2018-05-24 NOTE — ED Notes (Signed)
Pt/family verbalized understanding of discharge instructions.   

## 2018-05-24 NOTE — ED Triage Notes (Signed)
SOB, cough, x 1 week. Has been on abx and prednisone this week without relief.

## 2018-05-24 NOTE — Discharge Instructions (Signed)
1.  Continue to use your albuterol inhaler every 4-6 hours with the spacer. 2.  Finish antibiotics that you were previously prescribed. 3.  See your doctor for recheck at the beginning of the week. 4.  Return to the emergency department if you have any new or concerning symptoms or worsening symptoms.

## 2018-05-24 NOTE — ED Provider Notes (Signed)
[facility] EMERGENCY DEPARTMENT Provider Note   CSN: [id number] Arrival date & time: 05/24/18  1711     History   Chief Complaint Chief Complaint  Patient presents with  . Shortness of Breath    HPI Dawn Collins is a 72 y.o. female.  HPI Patient has had approximately 2 weeks of cough.  She reports that she started treatment last week from her doctor.  Has been taking doxycycline and prednisone.  She has an albuterol inhaler to use.  She was getting better but then a couple of days ago, cough increased and she started to feel more congestion again.  No fever developed.  No pain radiating to arms or neck.  No nausea or vomiting.  No lower extremity swelling or calf pain.  Patient had had pneumonia in the past, she and her companion were worried that maybe she was developing pneumonia and thought she should get rechecked. Past Medical History:  Diagnosis Date  . Arthritis    knees, back.  . Asthma    2 weeks cold,no problems now-well controlled  . Bursitis, knee    Bil. Hips, not knee  . Complication of anesthesia    slow to wake up  . Diverticulitis of colon 05-28-11   no current problems  . GERD (gastroesophageal reflux disease)    tx. pantoprazole  . Hypertension   . Multiple thyroid nodules   . Pneumonia   . PONV (postoperative nausea and vomiting)   . Sleep apnea    No cpap now -3 months last due to insurance reasons    Patient Active Problem List   Diagnosis Date Noted  . OA (osteoarthritis) of knee 08/08/2015  . Osteoarthritis of right knee 06/04/2011    Past Surgical History:  Procedure Laterality Date  . ABDOMINAL HYSTERECTOMY     partial hyst  . BREAST BIOPSY     several- all benign  . CARDIAC CATHETERIZATION     10'11  . [place]  . FOOT SURGERY     Bilateral  . KNEE ARTHROSCOPY     right '04  . TOTAL KNEE ARTHROPLASTY  06/04/2011   Procedure: TOTAL KNEE ARTHROPLASTY;  Surgeon: Gearlean Alf;  [facility];  Service: Orthopedics;  Laterality: Right;  . TOTAL KNEE ARTHROPLASTY Left 08/08/2015   Procedure: TOTAL LEFT KNEE ARTHROPLASTY;  Surgeon: Gaynelle Arabian, MD;  [facility];  Service: Orthopedics;  Laterality: Left;     OB History   None      Home Medications    Prior to Admission medications   Medication Sig Start Date End Date Taking? Authorizing Provider  apixaban (ELIQUIS) 2.5 MG TABS tablet Take 1 tablet (2.5 mg total) by mouth every 12 (twelve) hours. Take Eliquis twice a day for two and a half more weeks, then discontinue the Eliquis. Once the patient has completed the blood thinner regimen, then take a Baby 81 mg Aspirin daily for three more weeks. 08/09/15  Yes Perkins, Alexzandrew L, PA-C  cetirizine (ZYRTEC) 10 MG tablet Take 10 mg by mouth at bedtime as needed for allergies.   Yes [provider]  HYDROmorphone (DILAUDID) 2 MG tablet Take 1-2 tablets (2-4 mg total) by mouth every 4 (four) hours as needed for moderate pain or severe pain. 08/09/15  Yes Perkins, Alexzandrew L, PA-C  montelukast (SINGULAIR) 10 MG tablet Take 10 mg by mouth at bedtime.   Yes [provider]  acetaminophen (TYLENOL) 500 MG tablet  Take 500-1,000 mg by mouth every 6 (six) hours as needed. For arthritis    [provider]  ALBUTEROL IN Inhale into the lungs.    [provider]  amiloride-hydrochlorothiazide (MODURETIC) 5-50 MG tablet Take 1 tablet by mouth daily.      [provider]  aspirin EC 81 MG tablet Take 81 mg by mouth daily.    [provider]  diltiazem (CARDIZEM CD) 300 MG 24 hr capsule Take 300 mg by mouth daily.      [provider]  estradiol (ESTRACE) 2 MG tablet Take 2 mg by mouth daily.    [provider]  Fluticasone Furoate-Vilanterol (BREO ELLIPTA IN) Inhale into the lungs.    [provider]  gabapentin (NEURONTIN) 300 MG capsule Take 300 mg by mouth daily as needed. pain 06/28/15   [provider]  hydrochlorothiazide (HYDRODIURIL) 12.5 MG tablet Take 12.5 mg by mouth daily.    [provider]  latanoprost (XALATAN) 0.005 % ophthalmic solution Place 1 drop into both eyes at bedtime. 06/20/15   [provider]  losartan (COZAAR) 50 MG tablet Take 50 mg by mouth daily.    [provider]  pantoprazole (PROTONIX) 40 MG tablet Take 40 mg by mouth daily.      [provider]    Family History No family history on file.  Social History Social History   Tobacco Use  . Smoking status: Never Smoker  . Smokeless tobacco: Never Used  Substance Use Topics  . Alcohol use: No  . Drug use: No     Allergies   Morphine and related; Avelox [moxifloxacin hcl in nacl]; Celebrex [celecoxib]; Clarithromycin; Codeine; Food; Hycodan [hydrocodone-homatropine]; Pentazocine; Septra [bactrim]; Sertraline hcl; Stadol [butorphanol tartrate]; Duraphen [phenylephrine-guaifenesin]; Floxin [ofloxacin]; and Penicillins   Review of Systems Review of Systems 10 Systems reviewed and are negative for acute change except as noted in the HPI.   Physical Exam Updated Vital Signs BP (!) 148/60 (BP Location: Left Arm)   Pulse 68   Temp 98.2 F (36.8 C) (Oral)   Resp 18   Ht 5\' 5"  (1.651 m)   Wt 76.2 kg   SpO2 97%   BMI 27.96 kg/m   Physical Exam  Constitutional: She is oriented to person, place, and time.  Patient is clinically well in appearance.  She is alert and nontoxic.  No respiratory distress.  HENT:  Head: Normocephalic.  Eyes: EOM are normal.  Neck: Neck supple.  Cardiovascular: Normal rate, regular rhythm, normal heart sounds and intact distal pulses.  Pulmonary/Chest: Effort normal and breath sounds normal.  Abdominal: Soft. She exhibits no distension. There is no tenderness.  Musculoskeletal: Normal range of motion. She exhibits no edema or tenderness.  Neurological: She is alert and oriented to person, place, and time. No cranial nerve  deficit. She exhibits normal muscle tone. Coordination normal.  Skin: Skin is warm and dry.  Psychiatric: She has a normal mood and affect.     ED Treatments / Results  Labs (all labs ordered are listed, but only abnormal results are displayed) Labs Reviewed - No data to display  EKG EKG Interpretation  Date/Time:  Saturday May 24 2018 17:22:11 EDT Ventricular Rate:  69 PR Interval:  120 QRS Duration: 98 QT Interval:  394 QTC Calculation: 422 R Axis:   1 Text Interpretation:  Normal sinus rhythm Nonspecific ST abnormality Abnormal ECG no sig change from previous Confirmed by Charlesetta Shanks [phone]) on 05/24/2018 7:39:40 PM  Radiology Dg Chest 2 View  Result Date: 05/24/2018 CLINICAL DATA:  Cough and congestion for 1 week EXAM: CHEST - 2 VIEW COMPARISON:  04/02/2016 FINDINGS: Cardiac shadows within normal limits. The lungs are well aerated bilaterally. No focal infiltrate or sizable effusion is noted. Degenerative changes of the thoracic spine are seen. IMPRESSION: No acute abnormality noted. Electronically Signed   By: Inez Catalina M.D.   On: 05/24/2018 18:28    Procedures Procedures (including critical care time)  Medications Ordered in ED Medications  AEROCHAMBER PLUS FLO-VU MEDIUM MISC 1 each (has no administration in time range)     Initial Impression / Assessment and Plan / ED Course  I have reviewed the triage vital signs and the nursing notes.  Pertinent labs & imaging results that were available during my care of the patient were reviewed by me and considered in my medical decision making (see chart for details).     Patient is well in appearance.  Physical examination is normal.  She is not wheezing, no rales.  X-ray does not show any development of pneumonia.  EKG is unchanged from previous.  Patient reports her main concern was to make sure there was no pneumonia that had developed on x-ray.  Patient is currently under treatment and finishing a  prescription for doxycycline.  He does not have any active wheezing or respiratory distress.  I do not suspect cardiac ischemic symptoms.  Patient had a normal stress test within the past year by report.  Not describe any ischemic symptoms such as exertional shortness of breath or chest pain.  At this time, I feel she is stable to follow-up with her PCP and return precautions are reviewed.  Final Clinical Impressions(s) / ED Diagnoses   Final diagnoses:  Acute bronchitis, unspecified organism    ED Discharge Orders    None       Charlesetta Shanks, MD 05/24/18 2021

## 2018-07-29 ENCOUNTER — Encounter: Payer: Self-pay | Admitting: Physical Therapy

## 2018-07-29 ENCOUNTER — Other Ambulatory Visit: Payer: Self-pay

## 2018-07-29 ENCOUNTER — Ambulatory Visit: Payer: Medicare Other | Attending: Physician Assistant | Admitting: Physical Therapy

## 2018-07-29 DIAGNOSIS — M545 Low back pain, unspecified: Secondary | ICD-10-CM

## 2018-07-29 DIAGNOSIS — M25552 Pain in left hip: Secondary | ICD-10-CM | POA: Insufficient documentation

## 2018-07-29 DIAGNOSIS — R262 Difficulty in walking, not elsewhere classified: Secondary | ICD-10-CM | POA: Insufficient documentation

## 2018-07-29 DIAGNOSIS — M25551 Pain in right hip: Secondary | ICD-10-CM | POA: Diagnosis present

## 2018-07-29 NOTE — Therapy (Signed)
[place], [place], [address] Phone: [phone]   Fax:  [phone]  Physical Therapy Evaluation  Patient Details  Name: Dawn Collins MRN: [id number] Date of Birth: [birth year] Referring Provider (PT): Gerrit Halls, [place]   Encounter Date: 07/29/2018  PT End of Session - 07/29/18 1340    Visit Number  1    Date for PT Re-Evaluation  09/27/18    PT Start Time  1310    PT Stop Time  1400    PT Time Calculation (min)  50 min    Activity Tolerance  Patient tolerated treatment well    Behavior During Therapy  [facility] for tasks assessed/performed       Past Medical History:  Diagnosis Date  . Arthritis    knees, back.  . Asthma    2 weeks cold,no problems now-well controlled  . Bursitis, knee    Bil. Hips, not knee  . Complication of anesthesia    slow to wake up  . Diverticulitis of colon 05-28-11   no current problems  . GERD (gastroesophageal reflux disease)    tx. pantoprazole  . Hypertension   . Multiple thyroid nodules   . Pneumonia   . PONV (postoperative nausea and vomiting)   . Sleep apnea    No cpap now -3 months last due to insurance reasons    Past Surgical History:  Procedure Laterality Date  . ABDOMINAL HYSTERECTOMY     partial hyst  . BREAST BIOPSY     several- all benign  . CARDIAC CATHETERIZATION     10'11  . [place]  . FOOT SURGERY     Bilateral  . KNEE ARTHROSCOPY     right '04  . TOTAL KNEE ARTHROPLASTY  06/04/2011   Procedure: TOTAL KNEE ARTHROPLASTY;  Surgeon: Gearlean Alf;  Location: [facility];  Service: Orthopedics;  Laterality: Right;  . TOTAL KNEE ARTHROPLASTY Left 08/08/2015   Procedure: TOTAL LEFT KNEE ARTHROPLASTY;  Surgeon: Gaynelle Arabian, MD;  Location: [facility];  Service: Orthopedics;  Laterality: Left;    There were no vitals filed for this visit.   Subjective Assessment - 07/29/18 1316    Subjective  Patient reports that  she has had increased low back and right hip pain over the past few months.  She reports that she has had injections that did help some.  She reports that over the holidays she had some increased pain, she is very active cooking, she does hair 3 days a week and on her feet long hours, she does report that she was having to care for her husband who is now in a nursing center.    Pertinent History  has a history of bilateral TKA's, has had back and hip pain in th epast    Limitations  Lifting;Standing;Walking;House hold activities    Patient Stated Goals  have less pain, less difficulty with ADL's, care for husband    Currently in Pain?  Yes    Pain Score  6     Pain Location  Back   right hip   Pain Orientation  Right;Lower;Left    Pain Descriptors / Indicators  Aching;Burning    Pain Type  Acute pain    Pain Radiating Towards  does report some times pain down to the knee on the right    Pain Onset  More than a month ago    Pain Frequency  Constant    Aggravating Factors   weather, sitting long periods, pain up to 10/10 in the low back and in the hips    Pain Relieving Factors  movements, rest, tylenol at best a 2-3/10    Effect of Pain on Daily Activities  difficulty caring for husband         [facility] PT Assessment - 07/29/18 0001      Assessment   Medical Diagnosis  back and hip pain    Referring Provider (PT)  Gerrit Halls, PA    Onset Date/Surgical Date  06/28/18    Prior Therapy  yes about a year or more ago      Precautions   Precautions  None      Balance Screen   Has the patient fallen in the past 6 months  No    Has the patient had a decrease in activity level because of a fear of falling?   No    Is the patient reluctant to leave their home because of a fear of falling?   No      Home Environment   Additional Comments  no stiars, does the housework      Prior Function   Level of Independence  Independent    Vocation  Part time employment    IT trainer    Leisure  no exercise      Posture/Postural Control   Posture Comments  fwd head, decreased lordosis      ROM / Strength   AROM / PROM / Strength  AROM;Strength      AROM   Overall AROM Comments  lumbar ROM 75% for flexion and 100% for extension      Strength   Overall Strength Comments  hips and knees 4-/5      Flexibility   Soft Tissue Assessment /Muscle Length  yes    Hamstrings  tight HS    ITB  tight ITB    Piriformis  tight ITB      Palpation   Palpation comment  she is very tender in the low back, especially the right buttock, the ITB and the bilateral GT      Ambulation/Gait   Gait Comments  no device, antalgic on the right, slight forward flexion of the trunk', first few steps are bad due to pain and the inability to stand up                Objective measurements completed on examination: See above findings.      [facility] Adult PT Treatment/Exercise - 07/29/18 0001      Modalities   Modalities  Electrical Stimulation;Moist Heat      Moist Heat Therapy   Number Minutes Moist Heat  15 Minutes    Moist Heat Location  Lumbar Spine;Hip      Electrical Stimulation   Electrical Stimulation Location  low back into the right hip area    Electrical Stimulation Action  IFC    Electrical Stimulation Parameters  supine    Electrical Stimulation Goals  Pain               PT Short Term Goals - 07/29/18 1344      PT SHORT TERM GOAL #1   Title  independent with initial HEP    Time  2    Period  Weeks    Status  New        PT Long Term Goals - 07/29/18 1344  PT LONG TERM GOAL #1   Title  decrease pain 50%    Time  8    Period  Weeks    Status  New      PT LONG TERM GOAL #2   Title  increase strength of the hips to 4+/5     Time  8    Period  Weeks    Status  New      PT LONG TERM GOAL #3   Title  increase lumbar ROM 25%    Time  8    Period  Weeks    Status  New      PT LONG TERM GOAL #4   Title  walk with  minimal deviation    Time  8    Period  Weeks    Status  New             Plan - 07/29/18 1341    Clinical Impression Statement  Patient comes in today with low bakc pain and bilateral hip pain, right > left.  We have seen her for this in the past and she has always responded well to modalities and exercise.  She reports that her husband has been ill and she was caring for him and this may have caused the increase of pain.  She ahs a hard time walking especially with her first getting up from sitting.  She has weak LE's and is tight.    Clinical Presentation  Stable    Clinical Decision Making  Low    Rehab Potential  Good    PT Frequency  2x / week    PT Duration  8 weeks    PT Treatment/Interventions  ADLs/Self Care Home Management;Cryotherapy;Electrical Stimulation;Iontophoresis 4mg /ml Dexamethasone;Moist Heat;Ultrasound;Traction;Functional mobility training;Therapeutic activities;Therapeutic exercise;Patient/family education;Manual techniques;Dry needling    PT Next Visit Plan  slowly start exercises, could try ionto    Consulted and Agree with Plan of Care  Patient       Patient will benefit from skilled therapeutic intervention in order to improve the following deficits and impairments:  Pain, Improper body mechanics, Postural dysfunction, Increased muscle spasms, Decreased mobility, Decreased activity tolerance, Decreased range of motion, Decreased strength, Impaired flexibility, Difficulty walking  Visit Diagnosis: Pain in right hip - Plan: PT plan of care cert/re-cert  Acute bilateral low back pain without sciatica - Plan: PT plan of care cert/re-cert  Pain in left hip - Plan: PT plan of care cert/re-cert  Difficulty in walking, not elsewhere classified - Plan: PT plan of care cert/re-cert     Problem List Patient Active Problem List   Diagnosis Date Noted  . OA (osteoarthritis) of knee 08/08/2015  . Osteoarthritis of right knee 06/04/2011    Sumner Boast., PT 07/29/2018, 1:46 PM  [place], [place], [address] Phone: [phone]   Fax:  [phone]  Name: Domitila Stetler MRN: [id number] Date of Birth: 26-May-1946

## 2018-08-04 ENCOUNTER — Ambulatory Visit: Payer: Medicare Other | Admitting: Physical Therapy

## 2018-08-04 ENCOUNTER — Encounter: Payer: Self-pay | Admitting: Physical Therapy

## 2018-08-04 DIAGNOSIS — M545 Low back pain, unspecified: Secondary | ICD-10-CM

## 2018-08-04 DIAGNOSIS — M25551 Pain in right hip: Secondary | ICD-10-CM | POA: Diagnosis not present

## 2018-08-04 DIAGNOSIS — R262 Difficulty in walking, not elsewhere classified: Secondary | ICD-10-CM

## 2018-08-04 DIAGNOSIS — M25552 Pain in left hip: Secondary | ICD-10-CM

## 2018-08-04 NOTE — Therapy (Signed)
[place], [place], [address] Phone: [phone]   Fax:  [phone]  Physical Therapy Treatment  Patient Details  Name: Dawn Collins MRN: [id number] Date of Birth: 1945-12-03 Referring Provider (PT): Gerrit Halls, [place]   Encounter Date: 08/04/2018  PT End of Session - 08/04/18 1504    Visit Number  2    Date for PT Re-Evaluation  09/27/18    PT Start Time  1425    PT Stop Time  1510    PT Time Calculation (min)  45 min    Activity Tolerance  Patient tolerated treatment well;Patient limited by fatigue    Behavior During Therapy  [facility] for tasks assessed/performed       Past Medical History:  Diagnosis Date  . Arthritis    knees, back.  . Asthma    2 weeks cold,no problems now-well controlled  . Bursitis, knee    Bil. Hips, not knee  . Complication of anesthesia    slow to wake up  . Diverticulitis of colon 05-28-11   no current problems  . GERD (gastroesophageal reflux disease)    tx. pantoprazole  . Hypertension   . Multiple thyroid nodules   . Pneumonia   . PONV (postoperative nausea and vomiting)   . Sleep apnea    No cpap now -3 months last due to insurance reasons    Past Surgical History:  Procedure Laterality Date  . ABDOMINAL HYSTERECTOMY     partial hyst  . BREAST BIOPSY     several- all benign  . CARDIAC CATHETERIZATION     10'11  . [place]  . FOOT SURGERY     Bilateral  . KNEE ARTHROSCOPY     right '04  . TOTAL KNEE ARTHROPLASTY  06/04/2011   Procedure: TOTAL KNEE ARTHROPLASTY;  Surgeon: Gearlean Alf;  [facility]: [facility];  Service: Orthopedics;  Laterality: Right;  . TOTAL KNEE ARTHROPLASTY Left 08/08/2015   Procedure: TOTAL LEFT KNEE ARTHROPLASTY;  Surgeon: Gaynelle Arabian, MD;  [facility];  Service: Orthopedics;  Laterality: Left;    There were no vitals filed for this visit.  Subjective Assessment - 08/04/18 1428    Subjective  Pt reports that she has been pretty good.     Currently in Pain?  Yes    Pain Score  6     Pain Location  --   Hips and low back                       [facility] Adult PT Treatment/Exercise - 08/04/18 0001      Exercises   Exercises  Lumbar      Lumbar Exercises: Aerobic   Nustep  L4 x6 min       Lumbar Exercises: Seated   Other Seated Lumbar Exercises  Hip abd x 10 manual resistance    Other Seated Lumbar Exercises  ball squeezes x10      Modalities   Modalities  Electrical Stimulation;Moist Heat      Moist Heat Therapy   Number Minutes Moist Heat  15 Minutes    Moist Heat Location  Lumbar Spine;Hip      Electrical Stimulation   Electrical Stimulation Location  low back into the right hip area    Electrical Stimulation Action  IFC    Electrical Stimulation Parameters  supine    Electrical Stimulation Goals  Pain  PT Short Term Goals - 07/29/18 1344      PT SHORT TERM GOAL #1   Title  independent with initial HEP    Time  2    Period  Weeks    Status  New        PT Long Term Goals - 07/29/18 1344      PT LONG TERM GOAL #1   Title  decrease pain 50%    Time  8    Period  Weeks    Status  New      PT LONG TERM GOAL #2   Title  increase strength of the hips to 4+/5     Time  8    Period  Weeks    Status  New      PT LONG TERM GOAL #3   Title  increase lumbar ROM 25%    Time  8    Period  Weeks    Status  New      PT LONG TERM GOAL #4   Title  walk with minimal deviation    Time  8    Period  Weeks    Status  New            Plan - 08/04/18 1505    Clinical Impression Statement  Pt enters clinic reporting fatigue. She stated that she get the most relief form heat and TENS unit. She also reports that she  did not want to do much activity. Encouragement needed to get pt to do the few exercises. No reports of increase pain with the interventions. Positive response to MT.     Rehab Potential  Good     PT Frequency  2x / week    PT Duration  8 weeks    PT Treatment/Interventions  ADLs/Self Care Home Management;Cryotherapy;Electrical Stimulation;Iontophoresis 4mg /ml Dexamethasone;Moist Heat;Ultrasound;Traction;Functional mobility training;Therapeutic activities;Therapeutic exercise;Patient/family education;Manual techniques;Dry needling    PT Next Visit Plan  slowly start exercises       Patient will benefit from skilled therapeutic intervention in order to improve the following deficits and impairments:  Pain, Improper body mechanics, Postural dysfunction, Increased muscle spasms, Decreased mobility, Decreased activity tolerance, Decreased range of motion, Decreased strength, Impaired flexibility, Difficulty walking  Visit Diagnosis: Pain in right hip  Acute bilateral low back pain without sciatica  Pain in left hip  Difficulty in walking, not elsewhere classified     Problem List Patient Active Problem List   Diagnosis Date Noted  . OA (osteoarthritis) of knee 08/08/2015  . Osteoarthritis of right knee 06/04/2011    Scot Jun, PTA 08/04/2018, 3:15 PM  [place], [place], [address] Phone: [phone]   Fax:  [phone]  Name: Dawn Collins MRN: [id number] Date of Birth: 1945-12-29

## 2018-08-11 ENCOUNTER — Encounter: Payer: Self-pay | Admitting: Physical Therapy

## 2018-08-11 ENCOUNTER — Ambulatory Visit: Payer: Medicare Other | Admitting: Physical Therapy

## 2018-08-11 DIAGNOSIS — M545 Low back pain, unspecified: Secondary | ICD-10-CM

## 2018-08-11 DIAGNOSIS — M25551 Pain in right hip: Secondary | ICD-10-CM | POA: Diagnosis not present

## 2018-08-11 NOTE — Therapy (Signed)
[place], [place], [address] Phone: [phone]   Fax:  [phone]  Physical Therapy Treatment  Patient Details  Name: Dawn Collins MRN: [id number] Date of Birth: [birth year] Referring Provider (PT): Gerrit Halls, [place]   Encounter Date: 08/11/2018  PT End of Session - 08/11/18 1304    Visit Number  3    Date for PT Re-Evaluation  09/27/18    PT Start Time  1302    PT Stop Time  1353    PT Time Calculation (min)  51 min    Activity Tolerance  Patient tolerated treatment well    Behavior During Therapy  [facility] for tasks assessed/performed       Past Medical History:  Diagnosis Date  . Arthritis    knees, back.  . Asthma    2 weeks cold,no problems now-well controlled  . Bursitis, knee    Bil. Hips, not knee  . Complication of anesthesia    slow to wake up  . Diverticulitis of colon 05-28-11   no current problems  . GERD (gastroesophageal reflux disease)    tx. pantoprazole  . Hypertension   . Multiple thyroid nodules   . Pneumonia   . PONV (postoperative nausea and vomiting)   . Sleep apnea    No cpap now -3 months last due to insurance reasons    Past Surgical History:  Procedure Laterality Date  . ABDOMINAL HYSTERECTOMY     partial hyst  . BREAST BIOPSY     several- all benign  . CARDIAC CATHETERIZATION     10'11  . [place]  . FOOT SURGERY     Bilateral  . KNEE ARTHROSCOPY     right '04  . TOTAL KNEE ARTHROPLASTY  06/04/2011   Procedure: TOTAL KNEE ARTHROPLASTY;  Surgeon: Gearlean Alf;  Location: [facility];  Service: Orthopedics;  Laterality: Right;  . TOTAL KNEE ARTHROPLASTY Left 08/08/2015   Procedure: TOTAL LEFT KNEE ARTHROPLASTY;  Surgeon: Gaynelle Arabian, MD;  Location: [facility];  Service: Orthopedics;  Laterality: Left;    There were no vitals filed for this visit.  Subjective Assessment - 08/11/18 1304    Subjective  Pt reports she is  feeling a little better.  No new changes since last visit otherwise.     Patient Stated Goals  have less pain, less difficulty with ADL's, care for husband    Currently in Pain?  Yes    Pain Score  5     Pain Location  Back    Pain Orientation  Right;Left;Lower         [facility] PT Assessment - 08/11/18 0001      Assessment   Medical Diagnosis  back and hip pain    Referring Provider (PT)  Gerrit Halls, PA    Onset Date/Surgical Date  06/28/18    Prior Therapy  yes about a year or more ago       [facility] Adult PT Treatment/Exercise - 08/11/18 0001      Lumbar Exercises: Stretches   Passive Hamstring Stretch  Right;Left;30 seconds;4 reps   supine x 2, seated x 2    Single Knee to Chest Stretch  Right;Left;2 reps;10 seconds    Lower Trunk Rotation  3 reps;10 seconds    ITB Stretch  Left;1 rep;10 seconds   PTA assist   ITB Stretch Limitations  limited tolerance     Piriformis Stretch  Left;30 seconds;5 reps;Right;2 reps   seated x 2, supine x 3   Figure 4 Stretch  1 rep;20 seconds   LLE     Lumbar Exercises: Aerobic   Nustep  L4 x6 min       Lumbar Exercises: Supine   Bridge  10 reps      Moist Heat Therapy   Number Minutes Moist Heat  15 Minutes    Moist Heat Location  Lumbar Spine;Hip      Electrical Stimulation   Electrical Stimulation Location  Lt low back and Lt lateral hip    Electrical Stimulation Action  premod to each area    Electrical Stimulation Parameters  supine, to pt tolerance     Electrical Stimulation Goals  Pain      Manual Therapy   Manual Therapy  Soft tissue mobilization    Manual therapy comments  pt in Rt sidelying    Soft tissue mobilization  IASTM and STM to Lt lateral quad and lateral hamstring to decrease fascial tightness and pain.                PT Short Term Goals - 07/29/18 1344      PT SHORT TERM GOAL #1   Title  independent with initial HEP    Time  2    Period  Weeks    Status  New        PT Long Term Goals -  07/29/18 1344      PT LONG TERM GOAL #1   Title  decrease pain 50%    Time  8    Period  Weeks    Status  New      PT LONG TERM GOAL #2   Title  increase strength of the hips to 4+/5     Time  8    Period  Weeks    Status  New      PT LONG TERM GOAL #3   Title  increase lumbar ROM 25%    Time  8    Period  Weeks    Status  New      PT LONG TERM GOAL #4   Title  walk with minimal deviation    Time  8    Period  Weeks    Status  New            Plan - 08/11/18 1344    Clinical Impression Statement  Pt had some difficulty tolerating LE stretches; hamstring continue to be very tight.  Pt shown seated versions, as well as supine LE stretches.  Pt reported reduction of Lt thigh/hip pain after STM to area; further reduction with use of estim/MHP at end of session.  Pt is progressing gradually towards goals.     Rehab Potential  Good    PT Frequency  2x / week    PT Duration  8 weeks    PT Treatment/Interventions  ADLs/Self Care Home Management;Cryotherapy;Electrical Stimulation;Iontophoresis 4mg /ml Dexamethasone;Moist Heat;Ultrasound;Traction;Functional mobility training;Therapeutic activities;Therapeutic exercise;Patient/family education;Manual techniques;Dry needling    PT Next Visit Plan  continue gentle strengthening/stretching to Lt low back and hip; add LE stretches to HEP.      Consulted and Agree with Plan of Care  Patient       Patient will benefit from skilled therapeutic intervention in order to improve the following deficits and impairments:  Pain, Improper body mechanics, Postural dysfunction, Increased muscle spasms, Decreased mobility, Decreased activity tolerance, Decreased range of motion, Decreased strength, Impaired  flexibility, Difficulty walking  Visit Diagnosis: Pain in right hip  Acute bilateral low back pain without sciatica     Problem List Patient Active Problem List   Diagnosis Date Noted  . OA (osteoarthritis) of knee 08/08/2015  .  Osteoarthritis of right knee 06/04/2011   Kerin Perna, PTA 08/11/18 1:46 PM  [place], [place], [address] Phone: [phone]   Fax:  [phone]  Name: Dawn Collins MRN: [id number] Date of Birth: 1946-07-08

## 2018-08-12 ENCOUNTER — Ambulatory Visit: Payer: Medicare Other | Admitting: Physical Therapy

## 2018-08-12 DIAGNOSIS — M25551 Pain in right hip: Secondary | ICD-10-CM | POA: Diagnosis not present

## 2018-08-12 DIAGNOSIS — M25552 Pain in left hip: Secondary | ICD-10-CM

## 2018-08-12 DIAGNOSIS — R262 Difficulty in walking, not elsewhere classified: Secondary | ICD-10-CM

## 2018-08-12 DIAGNOSIS — M545 Low back pain, unspecified: Secondary | ICD-10-CM

## 2018-08-12 NOTE — Therapy (Signed)
[place], [place], [address] Phone: [phone]   Fax:  [phone]  Physical Therapy Treatment  Patient Details  Name: Dawn Collins MRN: [id number] Date of Birth: Oct 24, 1945 Referring Provider (PT): Gerrit Halls, [place]   Encounter Date: 08/12/2018  PT End of Session - 08/12/18 1339    Visit Number  4    Date for PT Re-Evaluation  09/27/18    PT Start Time  1315    PT Stop Time  1405    PT Time Calculation (min)  50 min       Past Medical History:  Diagnosis Date  . Arthritis    knees, back.  . Asthma    2 weeks cold,no problems now-well controlled  . Bursitis, knee    Bil. Hips, not knee  . Complication of anesthesia    slow to wake up  . Diverticulitis of colon 05-28-11   no current problems  . GERD (gastroesophageal reflux disease)    tx. pantoprazole  . Hypertension   . Multiple thyroid nodules   . Pneumonia   . PONV (postoperative nausea and vomiting)   . Sleep apnea    No cpap now -3 months last due to insurance reasons    Past Surgical History:  Procedure Laterality Date  . ABDOMINAL HYSTERECTOMY     partial hyst  . BREAST BIOPSY     several- all benign  . CARDIAC CATHETERIZATION     10'11  . [place]  . FOOT SURGERY     Bilateral  . KNEE ARTHROSCOPY     right '04  . TOTAL KNEE ARTHROPLASTY  06/04/2011   Procedure: TOTAL KNEE ARTHROPLASTY;  Surgeon: Gearlean Alf;  [facility]: [facility];  Service: Orthopedics;  Laterality: Right;  . TOTAL KNEE ARTHROPLASTY Left 08/08/2015   Procedure: TOTAL LEFT KNEE ARTHROPLASTY;  Surgeon: Gaynelle Arabian, MD;  [facility]: [facility];  Service: Orthopedics;  Laterality: Left;    There were no vitals filed for this visit.  Subjective Assessment - 08/12/18 1316    Subjective  stress and running to see husband makes it worse. moves side to side    Currently in Pain?  Yes    Pain Score  7     Pain Location  Back     Pain Orientation  Right;Left;Lower                       [facility] Adult PT Treatment/Exercise - 08/12/18 0001      Lumbar Exercises: Aerobic   Nustep  L5 x 6 min       Lumbar Exercises: Machines for Strengthening   Cybex Lumbar Extension  black tband 15 times    Other Lumbar Machine Exercise  15# seated row 2 sets 10      Lumbar Exercises: Standing   Other Standing Lumbar Exercises  hip ext and abd 10 reps       Lumbar Exercises: Seated   Other Seated Lumbar Exercises  clam shell red tband 15 x    Other Seated Lumbar Exercises  ball squeezes x15 hold 3 sec      Moist Heat Therapy   Number Minutes Moist Heat  15 Minutes    Moist Heat Location  Lumbar Spine;Hip      Electrical Stimulation   Electrical Stimulation Location  LB    Electrical Stimulation Action  IFC    Electrical Stimulation  Parameters  supine    Electrical Stimulation Goals  Pain               PT Short Term Goals - 08/12/18 1341      PT SHORT TERM GOAL #1   Title  independent with initial HEP    Status  Achieved        PT Long Term Goals - 07/29/18 1344      PT LONG TERM GOAL #1   Title  decrease pain 50%    Time  8    Period  Weeks    Status  New      PT LONG TERM GOAL #2   Title  increase strength of the hips to 4+/5     Time  8    Period  Weeks    Status  New      PT LONG TERM GOAL #3   Title  increase lumbar ROM 25%    Time  8    Period  Weeks    Status  New      PT LONG TERM GOAL #4   Title  walk with minimal deviation    Time  8    Period  Weeks    Status  New            Plan - 08/12/18 1340    Clinical Impression Statement  pt tolerated increased ther ex today but did require verb and tactile cuing to maintain upright posture and activate core. STG met focus on stretches    PT Treatment/Interventions  ADLs/Self Care Home Management;Cryotherapy;Electrical Stimulation;Iontophoresis '4mg'$ /ml Dexamethasone;Moist Heat;Ultrasound;Traction;Functional  mobility training;Therapeutic activities;Therapeutic exercise;Patient/family education;Manual techniques;Dry needling    PT Next Visit Plan  continue gentle strengthening/stretching to Lt low back and hip; add LE stretches to HEP.         Patient will benefit from skilled therapeutic intervention in order to improve the following deficits and impairments:  Pain, Improper body mechanics, Postural dysfunction, Increased muscle spasms, Decreased mobility, Decreased activity tolerance, Decreased range of motion, Decreased strength, Impaired flexibility, Difficulty walking  Visit Diagnosis: Pain in right hip  Acute bilateral low back pain without sciatica  Pain in left hip  Difficulty in walking, not elsewhere classified     Problem List Patient Active Problem List   Diagnosis Date Noted  . OA (osteoarthritis) of knee 08/08/2015  . Osteoarthritis of right knee 06/04/2011    Nikeshia Keetch,ANGIE PTA 08/12/2018, 1:43 PM  [place], [place], [address] Phone: [phone]   Fax:  [phone]  Name: Eyva Califano MRN: [id number] Date of Birth: Mar 07, 1946

## 2018-08-19 ENCOUNTER — Ambulatory Visit: Payer: Medicare Other | Admitting: Physical Therapy

## 2018-08-20 ENCOUNTER — Encounter: Payer: Self-pay | Admitting: Physical Therapy

## 2018-08-20 ENCOUNTER — Ambulatory Visit: Payer: Medicare Other | Admitting: Physical Therapy

## 2018-08-20 DIAGNOSIS — R262 Difficulty in walking, not elsewhere classified: Secondary | ICD-10-CM

## 2018-08-20 DIAGNOSIS — M25551 Pain in right hip: Secondary | ICD-10-CM | POA: Diagnosis not present

## 2018-08-20 DIAGNOSIS — M545 Low back pain, unspecified: Secondary | ICD-10-CM

## 2018-08-20 NOTE — Therapy (Signed)
[place], [place], [address] Phone: [phone]   Fax:  [phone]  Physical Therapy Treatment  Patient Details  Name: Dawn Collins MRN: [id number] Date of Birth: 20-Oct-1945 Referring Provider (PT): Gerrit Halls, [place]   Encounter Date: 08/20/2018  PT End of Session - 08/20/18 1625    Visit Number  5    Date for PT Re-Evaluation  09/27/18    PT Start Time  2637    PT Stop Time  1635    PT Time Calculation (min)  43 min    Activity Tolerance  Patient tolerated treatment well    Behavior During Therapy  [facility] for tasks assessed/performed       Past Medical History:  Diagnosis Date  . Arthritis    knees, back.  . Asthma    2 weeks cold,no problems now-well controlled  . Bursitis, knee    Bil. Hips, not knee  . Complication of anesthesia    slow to wake up  . Diverticulitis of colon 05-28-11   no current problems  . GERD (gastroesophageal reflux disease)    tx. pantoprazole  . Hypertension   . Multiple thyroid nodules   . Pneumonia   . PONV (postoperative nausea and vomiting)   . Sleep apnea    No cpap now -3 months last due to insurance reasons    Past Surgical History:  Procedure Laterality Date  . ABDOMINAL HYSTERECTOMY     partial hyst  . BREAST BIOPSY     several- all benign  . CARDIAC CATHETERIZATION     10'11  . [place]  . FOOT SURGERY     Bilateral  . KNEE ARTHROSCOPY     right '04  . TOTAL KNEE ARTHROPLASTY  06/04/2011   Procedure: TOTAL KNEE ARTHROPLASTY;  Surgeon: Gearlean Alf;  Location: [facility];  Service: Orthopedics;  Laterality: Right;  . TOTAL KNEE ARTHROPLASTY Left 08/08/2015   Procedure: TOTAL LEFT KNEE ARTHROPLASTY;  Surgeon: Gaynelle Arabian, MD;  Location: [facility];  Service: Orthopedics;  Laterality: Left;    There were no vitals filed for this visit.  Subjective Assessment - 08/20/18 1555    Subjective  "I feel all right" "I  have been getting on the heating pad myself too"    Currently in Pain?  Yes    Pain Score  6     Pain Location  Back    Pain Orientation  Right;Left                       [facility] Adult PT Treatment/Exercise - 08/20/18 0001      Lumbar Exercises: Aerobic   Nustep  L5 x 6 min       Lumbar Exercises: Machines for Strengthening   Cybex Lumbar Extension  black tband 15 times    Other Lumbar Machine Exercise  15# seated row 2 sets 10      Lumbar Exercises: Standing   Row  20 reps;Theraband;Both    Theraband Level (Row)  Level 2 (Red)      Lumbar Exercises: Seated   Other Seated Lumbar Exercises  clam shell green tband 15 x    Other Seated Lumbar Exercises  ball squeezes x15 hold 3 sec      Moist Heat Therapy   Number Minutes Moist Heat  15 Minutes    Moist Heat Location  Lumbar Spine  [employer]  IFC    Electrical Stimulation Parameters  supine    Electrical Stimulation Goals  Pain               PT Short Term Goals - 08/12/18 1341      PT SHORT TERM GOAL #1   Title  independent with initial HEP    Status  Achieved        PT Long Term Goals - 07/29/18 1344      PT LONG TERM GOAL #1   Title  decrease pain 50%    Time  8    Period  Weeks    Status  New      PT LONG TERM GOAL #2   Title  increase strength of the hips to 4+/5     Time  8    Period  Weeks    Status  New      PT LONG TERM GOAL #3   Title  increase lumbar ROM 25%    Time  8    Period  Weeks    Status  New      PT LONG TERM GOAL #4   Title  walk with minimal deviation    Time  8    Period  Weeks    Status  New            Plan - 08/20/18 1625    Clinical Impression Statement  Pt enters clinic reporting a shorten treatment time due to her needing to tend to her husband in the nursing home. Cues needed to maintain upright posture with seated rows, pt wanted to lean back with each  rep. Cues for pacing with seated ball squeezes and abduction for a better muscle contraction.    Rehab Potential  Good    PT Frequency  2x / week    PT Duration  8 weeks    PT Treatment/Interventions  ADLs/Self Care Home Management;Cryotherapy;Electrical Stimulation;Iontophoresis 4mg /ml Dexamethasone;Moist Heat;Ultrasound;Traction;Functional mobility training;Therapeutic activities;Therapeutic exercise;Patient/family education;Manual techniques;Dry needling    PT Next Visit Plan  continue gentle strengthening/stretching to Lt low back and hip; add LE stretches to HEP.         Patient will benefit from skilled therapeutic intervention in order to improve the following deficits and impairments:  Pain, Improper body mechanics, Postural dysfunction, Increased muscle spasms, Decreased mobility, Decreased activity tolerance, Decreased range of motion, Decreased strength, Impaired flexibility, Difficulty walking  Visit Diagnosis: Pain in right hip  Acute bilateral low back pain without sciatica  Difficulty in walking, not elsewhere classified     Problem List Patient Active Problem List   Diagnosis Date Noted  . OA (osteoarthritis) of knee 08/08/2015  . Osteoarthritis of right knee 06/04/2011    Scot Jun 08/20/2018, 4:28 PM  [place], [place], [address] Phone: [phone]   Fax:  [phone]  Name: Dawn Collins MRN: [id number] Date of Birth: 1945-11-17

## 2018-08-25 ENCOUNTER — Ambulatory Visit: Payer: Medicare Other | Attending: Physician Assistant | Admitting: Physical Therapy

## 2018-08-25 DIAGNOSIS — R262 Difficulty in walking, not elsewhere classified: Secondary | ICD-10-CM | POA: Insufficient documentation

## 2018-08-25 DIAGNOSIS — M25551 Pain in right hip: Secondary | ICD-10-CM | POA: Diagnosis present

## 2018-08-25 DIAGNOSIS — M25552 Pain in left hip: Secondary | ICD-10-CM | POA: Diagnosis present

## 2018-08-25 DIAGNOSIS — M545 Low back pain, unspecified: Secondary | ICD-10-CM

## 2018-08-25 NOTE — Therapy (Signed)
[place], [place], [address] Phone: [phone]   Fax:  [phone]  Physical Therapy Treatment  Patient Details  Name: Dawn Collins MRN: [id number] Date of Birth: Sep 19, 1945 Referring Provider (PT): Gerrit Halls, [place]   Encounter Date: 08/25/2018  PT End of Session - 08/25/18 1328    Visit Number  6    Date for PT Re-Evaluation  09/27/18    PT Start Time  1300    PT Stop Time  1345    PT Time Calculation (min)  45 min       Past Medical History:  Diagnosis Date  . Arthritis    knees, back.  . Asthma    2 weeks cold,no problems now-well controlled  . Bursitis, knee    Bil. Hips, not knee  . Complication of anesthesia    slow to wake up  . Diverticulitis of colon 05-28-11   no current problems  . GERD (gastroesophageal reflux disease)    tx. pantoprazole  . Hypertension   . Multiple thyroid nodules   . Pneumonia   . PONV (postoperative nausea and vomiting)   . Sleep apnea    No cpap now -3 months last due to insurance reasons    Past Surgical History:  Procedure Laterality Date  . ABDOMINAL HYSTERECTOMY     partial hyst  . BREAST BIOPSY     several- all benign  . CARDIAC CATHETERIZATION     10'11  . [place]  . FOOT SURGERY     Bilateral  . KNEE ARTHROSCOPY     right '04  . TOTAL KNEE ARTHROPLASTY  06/04/2011   Procedure: TOTAL KNEE ARTHROPLASTY;  Surgeon: Gearlean Alf;  Location: [facility];  Service: Orthopedics;  Laterality: Right;  . TOTAL KNEE ARTHROPLASTY Left 08/08/2015   Procedure: TOTAL LEFT KNEE ARTHROPLASTY;  Surgeon: Gaynelle Arabian, MD;  Location: [facility];  Service: Orthopedics;  Laterality: Left;    There were no vitals filed for this visit.  Subjective Assessment - 08/25/18 1301    Subjective  took some medicine so not much pain. therapy is helping    Currently in Pain?  No/denies                       [facility] Adult  PT Treatment/Exercise - 08/25/18 0001      Lumbar Exercises: Aerobic   Nustep  L5 x 6 min       Lumbar Exercises: Machines for Strengthening   Cybex Lumbar Extension  black tband 15 times flex and ext      Lumbar Exercises: Standing   Row  Both;15 reps;Theraband    Theraband Level (Row)  Level 2 (Red)   cued to stand up straight   Shoulder Extension  Both;Strengthening;15 reps;Theraband    Theraband Level (Shoulder Extension)  Level 2 (Red)   cued to stand up straight   Other Standing Lumbar Exercises  hip ext and abd 15 reps red tband      Lumbar Exercises: Supine   Other Supine Lumbar Exercises  bridge, KTC and obl with ball 15 times each      Moist Heat Therapy   Number Minutes Moist Heat  15 Minutes    Moist Heat Location  Lumbar Spine      Electrical Stimulation   Electrical Stimulation Location  LB    Electrical Stimulation Action  IFC  Electrical Stimulation Parameters  supine    Electrical Stimulation Goals  Pain               PT Short Term Goals - 08/12/18 1341      PT SHORT TERM GOAL #1   Title  independent with initial HEP    Status  Achieved        PT Long Term Goals - 08/25/18 1328      PT LONG TERM GOAL #1   Title  decrease pain 50%    Status  Partially Met      PT LONG TERM GOAL #2   Title  increase strength of the hips to 4+/5     Status  Partially Met      PT LONG TERM GOAL #3   Title  increase lumbar ROM 25%    Status  Achieved      PT LONG TERM GOAL #4   Title  walk with minimal deviation    Status  Partially Met            Plan - 08/25/18 1330    Clinical Impression Statement  progressing with goals. fatigues easily and difficulty maintaining upright trunk and difficulty with SLS    PT Treatment/Interventions  ADLs/Self Care Home Management;Cryotherapy;Electrical Stimulation;Iontophoresis 4m/ml Dexamethasone;Moist Heat;Ultrasound;Traction;Functional mobility training;Therapeutic activities;Therapeutic  exercise;Patient/family education;Manual techniques;Dry needling    PT Next Visit Plan  progress activities and increase HEP       Patient will benefit from skilled therapeutic intervention in order to improve the following deficits and impairments:  Pain, Improper body mechanics, Postural dysfunction, Increased muscle spasms, Decreased mobility, Decreased activity tolerance, Decreased range of motion, Decreased strength, Impaired flexibility, Difficulty walking  Visit Diagnosis: Pain in right hip  Acute bilateral low back pain without sciatica  Difficulty in walking, not elsewhere classified  Pain in left hip     Problem List Patient Active Problem List   Diagnosis Date Noted  . OA (osteoarthritis) of knee 08/08/2015  . Osteoarthritis of right knee 06/04/2011    PAYSEUR,ANGIE PTA 08/25/2018, 1:32 PM  [place] [address]Phone: [phone]  Fax:  [phone] Name: Dawn RingelMRN: [id number] of Birth: 508-05-1946

## 2018-08-26 ENCOUNTER — Ambulatory Visit: Payer: Medicare Other | Admitting: Physical Therapy

## 2018-09-02 ENCOUNTER — Ambulatory Visit: Payer: Medicare Other | Admitting: Physical Therapy

## 2018-09-02 DIAGNOSIS — M545 Low back pain, unspecified: Secondary | ICD-10-CM

## 2018-09-02 DIAGNOSIS — M25551 Pain in right hip: Secondary | ICD-10-CM | POA: Diagnosis not present

## 2018-09-02 DIAGNOSIS — M25552 Pain in left hip: Secondary | ICD-10-CM

## 2018-09-02 DIAGNOSIS — R262 Difficulty in walking, not elsewhere classified: Secondary | ICD-10-CM

## 2018-09-02 NOTE — Therapy (Signed)
[place], [place], [address] Phone: [phone]   Fax:  [phone]  Physical Therapy Treatment  Patient Details  Name: Dawn Collins MRN: [id number] Date of Birth: 04/17/1946 Referring Provider (PT): Gerrit Halls, [place]   Encounter Date: 09/02/2018  PT End of Session - 09/02/18 1555    Visit Number  7    Date for PT Re-Evaluation  09/27/18    PT Start Time  8841    PT Stop Time  1620    PT Time Calculation (min)  57 min       Past Medical History:  Diagnosis Date  . Arthritis    knees, back.  . Asthma    2 weeks cold,no problems now-well controlled  . Bursitis, knee    Bil. Hips, not knee  . Complication of anesthesia    slow to wake up  . Diverticulitis of colon 05-28-11   no current problems  . GERD (gastroesophageal reflux disease)    tx. pantoprazole  . Hypertension   . Multiple thyroid nodules   . Pneumonia   . PONV (postoperative nausea and vomiting)   . Sleep apnea    No cpap now -3 months last due to insurance reasons    Past Surgical History:  Procedure Laterality Date  . ABDOMINAL HYSTERECTOMY     partial hyst  . BREAST BIOPSY     several- all benign  . CARDIAC CATHETERIZATION     10'11  . [place]  . FOOT SURGERY     Bilateral  . KNEE ARTHROSCOPY     right '04  . TOTAL KNEE ARTHROPLASTY  06/04/2011   Procedure: TOTAL KNEE ARTHROPLASTY;  Surgeon: Gearlean Alf;  [facility];  Service: Orthopedics;  Laterality: Right;  . TOTAL KNEE ARTHROPLASTY Left 08/08/2015   Procedure: TOTAL LEFT KNEE ARTHROPLASTY;  Surgeon: Gaynelle Arabian, MD;  Location: [facility];  Service: Orthopedics;  Laterality: Left;    There were no vitals filed for this visit.  Subjective Assessment - 09/02/18 1528    Subjective  this rain is not helping me- really hurting, lets just do alittle ex. alot of stress with sick husband    Currently in Pain?  Yes    Pain  Score  8     Pain Location  Back                       [facility] Adult PT Treatment/Exercise - 09/02/18 0001      Lumbar Exercises: Aerobic   Nustep  L5 x 6 min       Lumbar Exercises: Machines for Strengthening   Cybex Lumbar Extension  black tband 20 times flex and ext    Other Lumbar Machine Exercise  20# seated row 2 sets 10      Lumbar Exercises: Supine   Ab Set  15 reps;3 seconds   with ball   Straight Leg Raise  10 reps    Other Supine Lumbar Exercises  bridge, KTC and obl with ball 15 times each      Moist Heat Therapy   Number Minutes Moist Heat  15 Minutes    Moist Heat Location  Lumbar Spine      Electrical Stimulation   Electrical Stimulation Location  LB    Electrical Stimulation Action  IFC    Electrical Stimulation Parameters  supine    Electrical Stimulation Goals  Pain               PT Short Term Goals - 08/12/18 1341      PT SHORT TERM GOAL #1   Title  independent with initial HEP    Status  Achieved        PT Long Term Goals - 08/25/18 1328      PT LONG TERM GOAL #1   Title  decrease pain 50%    Status  Partially Met      PT LONG TERM GOAL #2   Title  increase strength of the hips to 4+/5     Status  Partially Met      PT LONG TERM GOAL #3   Title  increase lumbar ROM 25%    Status  Achieved      PT LONG TERM GOAL #4   Title  walk with minimal deviation    Status  Partially Met            Plan - 09/02/18 1555    Clinical Impression Statement  pt arrived with increased pain and stiffness, requesting limited ex. modified ex and pt verb relief and felt better after ( some postural cuing needed and cued to activate core) relief with modalities    PT Treatment/Interventions  ADLs/Self Care Home Management;Cryotherapy;Electrical Stimulation;Iontophoresis '4mg'$ /ml Dexamethasone;Moist Heat;Ultrasound;Traction;Functional mobility training;Therapeutic activities;Therapeutic exercise;Patient/family education;Manual  techniques;Dry needling    PT Next Visit Plan  progress activities and increase HEP. CHECK GOALS       Patient will benefit from skilled therapeutic intervention in order to improve the following deficits and impairments:  Pain, Improper body mechanics, Postural dysfunction, Increased muscle spasms, Decreased mobility, Decreased activity tolerance, Decreased range of motion, Decreased strength, Impaired flexibility, Difficulty walking  Visit Diagnosis: Acute bilateral low back pain without sciatica  Difficulty in walking, not elsewhere classified  Pain in right hip  Pain in left hip     Problem List Patient Active Problem List   Diagnosis Date Noted  . OA (osteoarthritis) of knee 08/08/2015  . Osteoarthritis of right knee 06/04/2011    Kally Cadden,ANGIE PTA 09/02/2018, 3:57 PM  [place], [place], [address] Phone: [phone]   Fax:  [phone]  Name: Sharyah Bostwick MRN: [id number] Date of Birth: [birth year]

## 2018-09-03 ENCOUNTER — Encounter: Payer: Self-pay | Admitting: Physical Therapy

## 2018-09-03 ENCOUNTER — Ambulatory Visit: Payer: Medicare Other | Admitting: Physical Therapy

## 2018-09-03 DIAGNOSIS — R262 Difficulty in walking, not elsewhere classified: Secondary | ICD-10-CM

## 2018-09-03 DIAGNOSIS — M25551 Pain in right hip: Secondary | ICD-10-CM | POA: Diagnosis not present

## 2018-09-03 DIAGNOSIS — M545 Low back pain, unspecified: Secondary | ICD-10-CM

## 2018-09-03 NOTE — Therapy (Signed)
[place], [place], [address] Phone: [phone]   Fax:  [phone]  Physical Therapy Treatment  Patient Details  Name: Dawn Collins MRN: [id number] Date of Birth: 10-17-1945 Referring Provider (PT): Gerrit Halls, [place]   Encounter Date: 09/03/2018  PT End of Session - 09/03/18 1601    Visit Number  8    Date for PT Re-Evaluation  09/27/18    PT Start Time  1324    PT Stop Time  1614    PT Time Calculation (min)  59 min    Activity Tolerance  Patient tolerated treatment well    Behavior During Therapy  [facility] for tasks assessed/performed       Past Medical History:  Diagnosis Date  . Arthritis    knees, back.  . Asthma    2 weeks cold,no problems now-well controlled  . Bursitis, knee    Bil. Hips, not knee  . Complication of anesthesia    slow to wake up  . Diverticulitis of colon 05-28-11   no current problems  . GERD (gastroesophageal reflux disease)    tx. pantoprazole  . Hypertension   . Multiple thyroid nodules   . Pneumonia   . PONV (postoperative nausea and vomiting)   . Sleep apnea    No cpap now -3 months last due to insurance reasons    Past Surgical History:  Procedure Laterality Date  . ABDOMINAL HYSTERECTOMY     partial hyst  . BREAST BIOPSY     several- all benign  . CARDIAC CATHETERIZATION     10'11  . [place]  . FOOT SURGERY     Bilateral  . KNEE ARTHROSCOPY     right '04  . TOTAL KNEE ARTHROPLASTY  06/04/2011   Procedure: TOTAL KNEE ARTHROPLASTY;  Surgeon: Gearlean Alf;  Location: [facility];  Service: Orthopedics;  Laterality: Right;  . TOTAL KNEE ARTHROPLASTY Left 08/08/2015   Procedure: TOTAL LEFT KNEE ARTHROPLASTY;  Surgeon: Gaynelle Arabian, MD;  [facility];  Service: Orthopedics;  Laterality: Left;    There were no vitals filed for this visit.  Subjective Assessment - 09/03/18 1520    Subjective  "I am feeling all  right"    Currently in Pain?  Yes    Pain Score  6     Pain Location  Back    Pain Orientation  Right;Left         [facility] PT Assessment - 09/03/18 0001      Assessment   Medical Diagnosis  back and hip pain    Referring Provider (PT)  Gerrit Halls, [place]    Onset Date/Surgical Date  06/28/18    Prior Therapy  yes about a year or more ago      AROM   Overall AROM Comments  lumbar ROM 50% for flexion and 50% for extension                   [facility] Adult PT Treatment/Exercise - 09/03/18 0001      Lumbar Exercises: Stretches   Passive Hamstring Stretch  Right;Left;4 reps;10 seconds    Single Knee to Chest Stretch  2 reps;10 seconds      Lumbar Exercises: Aerobic   Nustep  L5 x 6 min       Lumbar Exercises: Machines for Strengthening   Cybex Lumbar Extension  black tband 20 times flex and ext  Other Lumbar Machine Exercise  20# seated row 2 sets 10      Lumbar Exercises: Seated   Sit to Stand  5 reps;10 reps   from blue chair     Moist Heat Therapy   Number Minutes Moist Heat  15 Minutes    Moist Heat Location  Lumbar Spine      Electrical Stimulation   Electrical Stimulation Location  LB    Electrical Stimulation Action  IFC    Electrical Stimulation Parameters  supine    Electrical Stimulation Goals  Pain               PT Short Term Goals - 08/12/18 1341      PT SHORT TERM GOAL #1   Title  independent with initial HEP    Status  Achieved        PT Long Term Goals - 09/03/18 1539      PT LONG TERM GOAL #1   Title  decrease pain 50%    Status  Partially Met      PT LONG TERM GOAL #2   Status  Partially Met      PT LONG TERM GOAL #3   Title  increase lumbar ROM 25%    Status  Achieved            Plan - 09/03/18 1601    Clinical Impression Statement  Pt arrived with increase fatigue but will to do some exercises. Decrease lumbar stiffness reported after seated rows and lats. She stated she could feel the seated extensions  working. Cues needed with a progression to sit to stands not to allow LE to push against chair when standing. Fatigues quick with sit to stand. Bilat HS are really tight, pt reports soreness with passive stretching.     Rehab Potential  Good    PT Treatment/Interventions  ADLs/Self Care Home Management;Cryotherapy;Electrical Stimulation;Iontophoresis '4mg'$ /ml Dexamethasone;Moist Heat;Ultrasound;Traction;Functional mobility training;Therapeutic activities;Therapeutic exercise;Patient/family education;Manual techniques;Dry needling    PT Next Visit Plan  progress activities       Patient will benefit from skilled therapeutic intervention in order to improve the following deficits and impairments:  Pain, Improper body mechanics, Postural dysfunction, Increased muscle spasms, Decreased mobility, Decreased activity tolerance, Decreased range of motion, Decreased strength, Impaired flexibility, Difficulty walking  Visit Diagnosis: Difficulty in walking, not elsewhere classified  Acute bilateral low back pain without sciatica  Pain in right hip     Problem List Patient Active Problem List   Diagnosis Date Noted  . OA (osteoarthritis) of knee 08/08/2015  . Osteoarthritis of right knee 06/04/2011    Scot Jun, PTA 09/03/2018, 4:09 PM  [place], [place], [address] Phone: [phone]   Fax:  [phone]  Name: Karolyna Bianchini MRN: [id number] Date of Birth: 11-24-1945

## 2018-09-09 ENCOUNTER — Ambulatory Visit: Payer: Medicare Other | Admitting: Physical Therapy

## 2019-08-10 ENCOUNTER — Ambulatory Visit: Payer: Medicare Other | Admitting: Physical Therapy

## 2019-09-08 ENCOUNTER — Other Ambulatory Visit: Payer: Self-pay

## 2019-09-08 ENCOUNTER — Ambulatory Visit: Payer: Medicare Other | Attending: Physician Assistant | Admitting: Physical Therapy

## 2019-09-08 ENCOUNTER — Encounter: Payer: Self-pay | Admitting: Physical Therapy

## 2019-09-08 DIAGNOSIS — R262 Difficulty in walking, not elsewhere classified: Secondary | ICD-10-CM | POA: Insufficient documentation

## 2019-09-08 DIAGNOSIS — M25551 Pain in right hip: Secondary | ICD-10-CM | POA: Insufficient documentation

## 2019-09-08 NOTE — Therapy (Signed)
[facility] [place], [place], [address] Phone: [phone]   Fax:  [phone]  Physical Therapy Evaluation  Patient Details  Name: Dawn Collins MRN: [id number] Date of Birth: 19-Apr-1946 Referring Provider (PT): Gerrit Halls, [place]   Encounter Date: 09/08/2019  PT End of Session - 09/08/19 1423    Visit Number  1    Date for PT Re-Evaluation  11/06/19    PT Start Time  5284    PT Stop Time  1445    PT Time Calculation (min)  49 min    Activity Tolerance  Patient tolerated treatment well    Behavior During Therapy  [facility] for tasks assessed/performed       Past Medical History:  Diagnosis Date  . Arthritis    knees, back.  . Asthma    2 weeks cold,no problems now-well controlled  . Bursitis, knee    Bil. Hips, not knee  . Complication of anesthesia    slow to wake up  . Diverticulitis of colon 05-28-11   no current problems  . GERD (gastroesophageal reflux disease)    tx. pantoprazole  . Hypertension   . Multiple thyroid nodules   . Pneumonia   . PONV (postoperative nausea and vomiting)   . Sleep apnea    No cpap now -3 months last due to insurance reasons    Past Surgical History:  Procedure Laterality Date  . ABDOMINAL HYSTERECTOMY     partial hyst  . BREAST BIOPSY     several- all benign  . CARDIAC CATHETERIZATION     10'11  . [place]  . FOOT SURGERY     Bilateral  . KNEE ARTHROSCOPY     right '04  . TOTAL KNEE ARTHROPLASTY  06/04/2011   Procedure: TOTAL KNEE ARTHROPLASTY;  Surgeon: Gearlean Alf;  [facility]: [facility];  Service: Orthopedics;  Laterality: Right;  . TOTAL KNEE ARTHROPLASTY Left 08/08/2015   Procedure: TOTAL LEFT KNEE ARTHROPLASTY;  Surgeon: Gaynelle Arabian, MD;  [facility];  Service: Orthopedics;  Laterality: Left;    There were no vitals filed for this visit.   Subjective Assessment - 09/08/19 1400    Subjective  Patient has been  seen here before with the hip pain and LBP.  She reports that the pain is pretty contant, she had recent injections that have helped some back about a month ago.  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  She just reports a fall last week    Pertinent History  has a history of bilateral TKA's, has had back and hip pain in th epast    Limitations  Lifting;Standing;Walking;House hold activities    Patient Stated Goals  have less pain, less difficulty with ADL's, care for husband    Currently in Pain?  Yes    Pain Score  5     Pain Location  Hip    Pain Orientation  Right;Lateral    Pain Descriptors / Indicators  Sore    Pain Type  Acute pain    Pain Radiating Towards  reports pain down the right right anterior and lateral thigh.    Pain Onset  More than a month ago    Pain Frequency  Constant    Aggravating Factors   standing, getting up and down pain can be  up to a 10/10    Pain Relieving Factors  staying active, mobic, uses heat and ice at best pain a 3/10    Effect of Pain on Daily Activities  difficulty standing ., caring for husband         [facility] PT Assessment - 09/08/19 0001      Assessment   Medical Diagnosis  back and hip pain    Referring Provider (PT)  Gerrit Halls, PA    Onset Date/Surgical Date  08/08/19    Prior Therapy  about a year ago for th esame      Precautions   Precautions  None      Balance Screen   Has the patient fallen in the past 6 months  Yes    How many times?  1    Has the patient had a decrease in activity level because of a fear of falling?   No    Is the patient reluctant to leave their home because of a fear of falling?   No      Home Environment   Additional Comments  does housework,  cares for husband who is in poor health      Prior Function   Level of Independence  Independent    Vocation  Part time employment    Psychologist, sport and exercise     Leisure  no exercise      Posture/Postural Control   Posture Comments  fwd head, decreased lordosis      AROM   Overall AROM Comments  lumbar ROM 50% for flexion and 50% for extension, hips are tight but demonstrates good ROM with some pain i the hips and the knees      Strength   Overall Strength Comments  right hip 3+/5 with right hip. 4-/5 left hip , knees 4-/5      Flexibility   Soft Tissue Assessment /Muscle Length  yes    Hamstrings  tight HS    ITB  tight ITB    Piriformis  tight ITB      Palpation   Palpation comment  very tender in the left GT area and into the left ITB      Ambulation/Gait   Gait Comments  antalgic on the right, no device                Objective measurements completed on examination: See above findings.      [facility] Adult PT Treatment/Exercise - 09/08/19 0001      Modalities   Modalities  Electrical Stimulation;Moist Heat      Moist Heat Therapy   Number Minutes Moist Heat  15 Minutes    Moist Heat Location  Hip      Electrical Stimulation   Electrical Stimulation Location  right hip     Electrical Stimulation Action  IFC    Electrical Stimulation Parameters  supine    Electrical Stimulation Goals  Pain               PT Short Term Goals - 09/08/19 1426      PT SHORT TERM GOAL #1   Title  independent with initial HEP    Time  2    Period  Weeks    Status  New        PT Long Term Goals - 09/08/19 1426      PT LONG TERM GOAL #1   Title  decrease pain 50%    Time  8  Period  Weeks    Status  New      PT LONG TERM GOAL #2   Title  increase strength of the hips to 4+/5     Time  8    Period  Weeks    Status  New      PT LONG TERM GOAL #3   Title  increase lumbar ROM 25%    Time  8    Period  Weeks    Status  New      PT LONG TERM GOAL #4   Title  walk with minimal deviation    Time  8    Period  Weeks    Status  New             Plan - 09/08/19 1424    Clinical Impression Statement   Patietn reports that she has had pain in th ehips for years, we have seen her here in the past and she gets better but will continue to have the pain.  She is tight in the LE's, very tender in the right GT and ITB areaq.  The right hip is weaker and painful with MMT.  She has a limp on the right, she had a cortisone injection about 6 weeks ago with some good relief but reports tha tthe pain is returning    Personal Factors and Comorbidities  Comorbidity 3+    Comorbidities  bilateral TKA's GERD    Clinical Decision Making  Low    Rehab Potential  Good    PT Frequency  2x / week    PT Duration  8 weeks    PT Treatment/Interventions  ADLs/Self Care Home Management;Cryotherapy;Electrical Stimulation;Iontophoresis 4mg /ml Dexamethasone;Moist Heat;Ultrasound;Traction;Functional mobility training;Therapeutic activities;Therapeutic exercise;Patient/family education;Manual techniques;Dry needling    PT Next Visit Plan  slowly add stretches and activities    Consulted and Agree with Plan of Care  Patient       Patient will benefit from skilled therapeutic intervention in order to improve the following deficits and impairments:  Pain, Improper body mechanics, Postural dysfunction, Increased muscle spasms, Decreased mobility, Decreased activity tolerance, Decreased range of motion, Decreased strength, Impaired flexibility, Difficulty walking  Visit Diagnosis: Pain in right hip - Plan: PT plan of care cert/re-cert  Difficulty in walking, not elsewhere classified - Plan: PT plan of care cert/re-cert     Problem List Patient Active Problem List   Diagnosis Date Noted  . OA (osteoarthritis) of knee 08/08/2015  . Osteoarthritis of right knee 06/04/2011    Sumner Boast., PT 09/08/2019, 2:28 PM  [facility] [place], [place], [address] Phone: [phone]   Fax:  [phone]  Name: Dawn Collins MRN: [id number] Date  of Birth: 1945-10-29

## 2019-09-22 ENCOUNTER — Other Ambulatory Visit: Payer: Self-pay

## 2019-09-22 ENCOUNTER — Ambulatory Visit: Payer: Medicare Other | Attending: Physician Assistant | Admitting: Physical Therapy

## 2019-09-22 DIAGNOSIS — M25552 Pain in left hip: Secondary | ICD-10-CM | POA: Insufficient documentation

## 2019-09-22 DIAGNOSIS — M25551 Pain in right hip: Secondary | ICD-10-CM | POA: Insufficient documentation

## 2019-09-22 DIAGNOSIS — R262 Difficulty in walking, not elsewhere classified: Secondary | ICD-10-CM | POA: Diagnosis present

## 2019-09-22 DIAGNOSIS — M545 Low back pain, unspecified: Secondary | ICD-10-CM

## 2019-09-22 NOTE — Therapy (Signed)
[facility] [place], [place], [address] Phone: [phone]   Fax:  [phone]  Physical Therapy Treatment  Patient Details  Name: Dawn Collins MRN: [id number] Date of Birth: 1945-09-25 Referring Provider (PT): Dawn Collins, [place]   Encounter Date: 09/22/2019  PT End of Session - 09/22/19 1220    Visit Number  2    Date for PT Re-Evaluation  11/06/19    PT Start Time  3151    PT Stop Time  1225    PT Time Calculation (min)  40 min       Past Medical History:  Diagnosis Date  . Arthritis    knees, back.  . Asthma    2 weeks cold,no problems now-well controlled  . Bursitis, knee    Bil. Hips, not knee  . Complication of anesthesia    slow to wake up  . Diverticulitis of colon 05-28-11   no current problems  . GERD (gastroesophageal reflux disease)    tx. pantoprazole  . Hypertension   . Multiple thyroid nodules   . Pneumonia   . PONV (postoperative nausea and vomiting)   . Sleep apnea    No cpap now -3 months last due to insurance reasons    Past Surgical History:  Procedure Laterality Date  . ABDOMINAL HYSTERECTOMY     partial hyst  . BREAST BIOPSY     several- all benign  . CARDIAC CATHETERIZATION     10'11  . [place]  . FOOT SURGERY     Bilateral  . KNEE ARTHROSCOPY     right '04  . TOTAL KNEE ARTHROPLASTY  06/04/2011   Procedure: TOTAL KNEE ARTHROPLASTY;  Surgeon: Gearlean Alf;  [facility];  Service: Orthopedics;  Laterality: Right;  . TOTAL KNEE ARTHROPLASTY Left 08/08/2015   Procedure: TOTAL LEFT KNEE ARTHROPLASTY;  Surgeon: Gaynelle Arabian, MD;  [facility];  Service: Orthopedics;  Laterality: Left;    There were no vitals filed for this visit.  Subjective Assessment - 09/22/19 1147    Subjective  realy limping this weekend and fell on Saturday on RT knee    Currently in Pain?  Yes    Pain Score  7     Pain Location  Hip    Pain  Orientation  Right;Lateral                       [facility] Adult PT Treatment/Exercise - 09/22/19 0001      Exercises   Exercises  Lumbar;Knee/Hip      Lumbar Exercises: Supine   Ab Set  10 reps;3 seconds   with ball   Bent Knee Raise  10 reps;3 seconds   red tband   Bridge with clamshell  Compliant;15 reps;3 seconds   red tband   Bridge with [employer] Limitations  ball squeeze 15 x 3 sec      Modalities   Modalities  Iontophoresis;Ultrasound      Ultrasound   Ultrasound Location  RT GT and RT SI    Ultrasound Parameters  1 hhz 100% cont    Ultrasound Goals  Pain      Iontophoresis   Type of Iontophoresis  Dexamethasone    Location  RT SI    Dose  1.2 cc 80 mA patch    Time  4 hour      Manual Therapy   Manual Therapy  Passive ROM    Manual therapy comments  very tight and pain limited-verb felt better after stretching    Passive ROM  RT LE and trunk               PT Short Term Goals - 09/08/19 1426      PT SHORT TERM GOAL #1   Title  independent with initial HEP    Time  2    Period  Weeks    Status  New        PT Long Term Goals - 09/08/19 1426      PT LONG TERM GOAL #1   Title  decrease pain 50%    Time  8    Period  Weeks    Status  New      PT LONG TERM GOAL #2   Title  increase strength of the hips to 4+/5     Time  8    Period  Weeks    Status  New      PT LONG TERM GOAL #3   Title  increase lumbar ROM 25%    Time  8    Period  Weeks    Status  New      PT LONG TERM GOAL #4   Title  walk with minimal deviation    Time  8    Period  Weeks    Status  New            Plan - 09/22/19 1221    Clinical Impression Statement  pt arrived with increased pain and verb fall on Saturday, very tight and painful with stretching but after verb felt better. pt stated US and ionto helped the most in the past    PT Treatment/Interventions  ADLs/Self Care Home Management;Cryotherapy;Electrical Stimulation;Iontophoresis  4mg /ml Dexamethasone;Moist Heat;Ultrasound;Traction;Functional mobility training;Therapeutic activities;Therapeutic exercise;Patient/family education;Manual techniques;Dry needling    PT Next Visit Plan  assess and progress       Patient will benefit from skilled therapeutic intervention in order to improve the following deficits and impairments:  Pain, Improper body mechanics, Postural dysfunction, Increased muscle spasms, Decreased mobility, Decreased activity tolerance, Decreased range of motion, Decreased strength, Impaired flexibility, Difficulty walking  Visit Diagnosis: Difficulty in walking, not elsewhere classified  Pain in right hip  Acute bilateral low back pain without sciatica     Problem List Patient Active Problem List   Diagnosis Date Noted  . OA (osteoarthritis) of knee 08/08/2015  . Osteoarthritis of right knee 06/04/2011    Dawn Collins,Dawn Collins PTA 09/22/2019, 12:23 PM  [facility] [place] [address] [place], [place], [address] Phone: [phone]   Fax:  [phone]  Name: Dawn Collins MRN: [id number] Date of Birth: 06/10/1946

## 2019-09-27 ENCOUNTER — Ambulatory Visit: Payer: Medicare Other | Attending: Internal Medicine

## 2019-09-27 DIAGNOSIS — Z23 Encounter for immunization: Secondary | ICD-10-CM | POA: Insufficient documentation

## 2019-09-27 NOTE — Progress Notes (Signed)
   Covid-19 Vaccination Clinic  Name:  Dawn Collins    MRN: [id number] DOB: 1946-01-20  09/27/2019  [name] was observed post Covid-19 immunization for 15 minutes without incident. She was provided with Vaccine Information Sheet and instruction to access the V-Safe system.   [name] was instructed to call 911 with any severe reactions post vaccine: Marland Kitchen Difficulty breathing  . Swelling of face and throat  . A fast heartbeat  . A bad rash all over body  . Dizziness and weakness   Immunizations Administered    Name Date Dose VIS Date Route   Pfizer COVID-19 Vaccine 09/27/2019  2:50 PM 0.3 mL 07/03/2019 Intramuscular   Manufacturer: [place]   Lot: [id number]   [place]: [address]-0

## 2019-09-29 ENCOUNTER — Other Ambulatory Visit: Payer: Self-pay

## 2019-09-29 ENCOUNTER — Ambulatory Visit: Payer: Medicare Other | Admitting: Physical Therapy

## 2019-09-29 DIAGNOSIS — R262 Difficulty in walking, not elsewhere classified: Secondary | ICD-10-CM | POA: Diagnosis not present

## 2019-09-29 DIAGNOSIS — M25551 Pain in right hip: Secondary | ICD-10-CM

## 2019-09-29 NOTE — Therapy (Signed)
[place], [place], [address] Phone: [phone]   Fax:  [phone]  Physical Therapy Treatment  Patient Details  Name: Dawn Collins MRN: [id number] Date of Birth: 1946-03-30 Referring Provider (PT): Gerrit Halls, [place]   Encounter Date: 09/29/2019  PT End of Session - 09/29/19 1228    Visit Number  3    Date for PT Re-Evaluation  11/06/19    PT Start Time  2481    PT Stop Time  1230    PT Time Calculation (min)  45 min       Past Medical History:  Diagnosis Date  . Arthritis    knees, back.  . Asthma    2 weeks cold,no problems now-well controlled  . Bursitis, knee    Bil. Hips, not knee  . Complication of anesthesia    slow to wake up  . Diverticulitis of colon 05-28-11   no current problems  . GERD (gastroesophageal reflux disease)    tx. pantoprazole  . Hypertension   . Multiple thyroid nodules   . Pneumonia   . PONV (postoperative nausea and vomiting)   . Sleep apnea    No cpap now -3 months last due to insurance reasons    Past Surgical History:  Procedure Laterality Date  . ABDOMINAL HYSTERECTOMY     partial hyst  . BREAST BIOPSY     several- all benign  . CARDIAC CATHETERIZATION     10'11  . [place]  . FOOT SURGERY     Bilateral  . KNEE ARTHROSCOPY     right '04  . TOTAL KNEE ARTHROPLASTY  06/04/2011   Procedure: TOTAL KNEE ARTHROPLASTY;  Surgeon: Gearlean Alf;  [facility];  Service: Orthopedics;  Laterality: Right;  . TOTAL KNEE ARTHROPLASTY Left 08/08/2015   Procedure: TOTAL LEFT KNEE ARTHROPLASTY;  Surgeon: Gaynelle Arabian, MD;  [facility];  Service: Orthopedics;  Laterality: Left;    There were no vitals filed for this visit.  Subjective Assessment - 09/29/19 1152    Subjective  last tx helped untill I stepped into car and it really pulled. I get stiff easy    Currently in Pain?  Yes    Pain Score  5     Pain  Location  Hip    Pain Orientation  Right;Lateral                       [facility] Adult PT Treatment/Exercise - 09/29/19 0001      Lumbar Exercises: Supine   Bent Knee Raise  15 reps;3 seconds   red tband   Bridge with Ball Squeeze  Compliant;20 reps;3 seconds    Bridge with clamshell  Compliant;3 seconds;20 reps   red tband   Other Supine Lumbar Exercises  feet on ball bridge, KTC and obl 15 each      Ultrasound   Ultrasound Location  RT GT and RT SI    Ultrasound Parameters  1 mhz 100% cont    Ultrasound Goals  Pain      Iontophoresis   Type of Iontophoresis  Dexamethasone    Location  RT SI     Dose  1.2 cc 80 mA patch    Time  4 hour      Manual Therapy   Manual Therapy  Passive ROM;Soft tissue mobilization    Manual therapy comments  very tight and  pain limited-verb felt better after stretching    Soft tissue mobilization  ITB and RT lateral quad    Passive ROM  RT LE and trunk               PT Short Term Goals - 09/29/19 1157      PT SHORT TERM GOAL #1   Title  independent with initial HEP    Baseline  home stretching    Status  Achieved        PT Long Term Goals - 09/08/19 1426      PT LONG TERM GOAL #1   Title  decrease pain 50%    Time  8    Period  Weeks    Status  New      PT LONG TERM GOAL #2   Title  increase strength of the hips to 4+/5     Time  8    Period  Weeks    Status  New      PT LONG TERM GOAL #3   Title  increase lumbar ROM 25%    Time  8    Period  Weeks    Status  New      PT LONG TERM GOAL #4   Title  walk with minimal deviation    Time  8    Period  Weeks    Status  New            Plan - 09/29/19 1229    Clinical Impression Statement  STG met, pt resumin ggentle stretching. educ on BM and increased ease of getting in/out of car. pt very tender witj multi TP with MT but after stated she felt better. continued relief with [place] and iono    PT Treatment/Interventions  ADLs/Self Care Home  Management;Cryotherapy;Electrical Stimulation;Iontophoresis 34m/ml Dexamethasone;Moist Heat;Ultrasound;Traction;Functional mobility training;Therapeutic activities;Therapeutic exercise;Patient/family education;Manual techniques;Dry needling    PT Next Visit Plan  progress as tolerated. add to HEP  ( strength)       Patient will benefit from skilled therapeutic intervention in order to improve the following deficits and impairments:  Pain, Improper body mechanics, Postural dysfunction, Increased muscle spasms, Decreased mobility, Decreased activity tolerance, Decreased range of motion, Decreased strength, Impaired flexibility, Difficulty walking  Visit Diagnosis: Difficulty in walking, not elsewhere classified  Pain in right hip     Problem List Patient Active Problem List   Diagnosis Date Noted  . OA (osteoarthritis) of knee 08/08/2015  . Osteoarthritis of right knee 06/04/2011    Tasharra Nodine,ANGIE PTA 09/29/2019, 12:31 PM  [address] [address]Phone: [phone]  Fax:  [phone] Name: Dawn PuringtonMRN: [id number] of Birth: [birth year]

## 2019-10-06 ENCOUNTER — Ambulatory Visit: Payer: Medicare Other | Admitting: Physical Therapy

## 2019-10-13 ENCOUNTER — Other Ambulatory Visit: Payer: Self-pay

## 2019-10-13 ENCOUNTER — Encounter: Payer: Self-pay | Admitting: Physical Therapy

## 2019-10-13 ENCOUNTER — Ambulatory Visit: Payer: Medicare Other | Admitting: Physical Therapy

## 2019-10-13 DIAGNOSIS — M25551 Pain in right hip: Secondary | ICD-10-CM

## 2019-10-13 DIAGNOSIS — R262 Difficulty in walking, not elsewhere classified: Secondary | ICD-10-CM

## 2019-10-13 DIAGNOSIS — M545 Low back pain, unspecified: Secondary | ICD-10-CM

## 2019-10-13 DIAGNOSIS — M25552 Pain in left hip: Secondary | ICD-10-CM

## 2019-10-13 NOTE — Therapy (Signed)
[place], [place], [address] Phone: [phone]   Fax:  [phone]  Physical Therapy Treatment  Patient Details  Name: Dawn Collins MRN: [id number] Date of Birth: 03-11-1946 Referring Provider (PT): Gerrit Halls, [place]   Encounter Date: 10/13/2019  PT End of Session - 10/13/19 1229    Visit Number  4    Date for PT Re-Evaluation  11/06/19    PT Start Time  0258    PT Stop Time  1226    PT Time Calculation (min)  41 min    Activity Tolerance  Patient tolerated treatment well    Behavior During Therapy  [facility] for tasks assessed/performed       Past Medical History:  Diagnosis Date  . Arthritis    knees, back.  . Asthma    2 weeks cold,no problems now-well controlled  . Bursitis, knee    Bil. Hips, not knee  . Complication of anesthesia    slow to wake up  . Diverticulitis of colon 05-28-11   no current problems  . GERD (gastroesophageal reflux disease)    tx. pantoprazole  . Hypertension   . Multiple thyroid nodules   . Pneumonia   . PONV (postoperative nausea and vomiting)   . Sleep apnea    No cpap now -3 months last due to insurance reasons    Past Surgical History:  Procedure Laterality Date  . ABDOMINAL HYSTERECTOMY     partial hyst  . BREAST BIOPSY     several- all benign  . CARDIAC CATHETERIZATION     10'11  . [place]  . FOOT SURGERY     Bilateral  . KNEE ARTHROSCOPY     right '04  . TOTAL KNEE ARTHROPLASTY  06/04/2011   Procedure: TOTAL KNEE ARTHROPLASTY;  Surgeon: Gearlean Alf;  [facility];  Service: Orthopedics;  Laterality: Right;  . TOTAL KNEE ARTHROPLASTY Left 08/08/2015   Procedure: TOTAL LEFT KNEE ARTHROPLASTY;  Surgeon: Gaynelle Arabian, MD;  [facility];  Service: Orthopedics;  Laterality: Left;    There were no vitals filed for this visit.  Subjective Assessment - 10/13/19 1146    Subjective  "I just hurt" Back,  hips and goes down her leg    Currently in Pain?  Yes    Pain Score  9     Pain Location  Hip                       [facility] Adult PT Treatment/Exercise - 10/13/19 0001      Lumbar Exercises: Stretches   Passive Hamstring Stretch  Right;Left;5 reps;20 seconds    Single Knee to Chest Stretch  Right;Left;4 reps;20 seconds    ITB Stretch  Left;Right;4 reps;20 seconds    Piriformis Stretch  Right;Left;4 reps;20 seconds      Ultrasound   Ultrasound Location  RT GT and R SI    Ultrasound Parameters  64mHz 100% 1.1w/cm2    Ultrasound Goals  Pain      Iontophoresis   Type of Iontophoresis  Dexamethasone    Location  RT SI     Dose  1.2 cc 80 mA patch    Time  4 hour      Manual Therapy   Manual Therapy  Passive ROM;Soft tissue mobilization    Manual therapy comments  very tight and pain limited-verb felt better after stretching  Soft tissue mobilization  ITB and RT lateral quad               PT Short Term Goals - 09/29/19 1157      PT SHORT TERM GOAL #1   Title  independent with initial HEP    Baseline  home stretching    Status  Achieved        PT Long Term Goals - 09/08/19 1426      PT LONG TERM GOAL #1   Title  decrease pain 50%    Time  8    Period  Weeks    Status  New      PT LONG TERM GOAL #2   Title  increase strength of the hips to 4+/5     Time  8    Period  Weeks    Status  New      PT LONG TERM GOAL #3   Title  increase lumbar ROM 25%    Time  8    Period  Weeks    Status  New      PT LONG TERM GOAL #4   Title  walk with minimal deviation    Time  8    Period  Weeks    Status  New            Plan - 10/13/19 1230    Clinical Impression Statement  Continued stretching, she report that it really helps. Positive response to STM to lateral R quad. Continued relief with [place] and ionto    Personal Factors and Comorbidities  Comorbidity 3+    Comorbidities  bilateral TKA's GERD    Rehab Potential  Good    PT Frequency  2x  / week    PT Duration  8 weeks    PT Treatment/Interventions  ADLs/Self Care Home Management;Cryotherapy;Electrical Stimulation;Iontophoresis 4mg /ml Dexamethasone;Moist Heat;Ultrasound;Traction;Functional mobility training;Therapeutic activities;Therapeutic exercise;Patient/family education;Manual techniques;Dry needling    PT Next Visit Plan  progress as tolerated. add to HEP  ( strength)       Patient will benefit from skilled therapeutic intervention in order to improve the following deficits and impairments:  Pain, Improper body mechanics, Postural dysfunction, Increased muscle spasms, Decreased mobility, Decreased activity tolerance, Decreased range of motion, Decreased strength, Impaired flexibility, Difficulty walking  Visit Diagnosis: Pain in right hip  Acute bilateral low back pain without sciatica  Pain in left hip  Difficulty in walking, not elsewhere classified     Problem List Patient Active Problem List   Diagnosis Date Noted  . OA (osteoarthritis) of knee 08/08/2015  . Osteoarthritis of right knee 06/04/2011    Scot Jun 10/13/2019, 12:32 PM  [place], [place], [address] Phone: [phone]   Fax:  [phone]  Name: Dawn Collins MRN: [id number] Date of Birth: 1945/10/25

## 2019-10-20 ENCOUNTER — Encounter: Payer: Self-pay | Admitting: Physical Therapy

## 2019-10-20 ENCOUNTER — Ambulatory Visit: Payer: Medicare Other | Admitting: Physical Therapy

## 2019-10-20 ENCOUNTER — Other Ambulatory Visit: Payer: Self-pay

## 2019-10-20 DIAGNOSIS — M25552 Pain in left hip: Secondary | ICD-10-CM

## 2019-10-20 DIAGNOSIS — M545 Low back pain, unspecified: Secondary | ICD-10-CM

## 2019-10-20 DIAGNOSIS — R262 Difficulty in walking, not elsewhere classified: Secondary | ICD-10-CM

## 2019-10-20 DIAGNOSIS — M25551 Pain in right hip: Secondary | ICD-10-CM

## 2019-10-20 NOTE — Therapy (Signed)
[address] [place], [place], [address] Phone: [phone]   Fax:  [phone]  Physical Therapy Treatment  Patient Details  Name: Dawn Collins MRN: [id number] Date of Birth: 1945-12-16 Referring Provider (PT): Gerrit Halls, [place]   Encounter Date: 10/20/2019  PT End of Session - 10/20/19 1341    Visit Number  5    Date for PT Re-Evaluation  11/06/19    PT Start Time  1300    PT Stop Time  1344    PT Time Calculation (min)  44 min    Activity Tolerance  Patient tolerated treatment well    Behavior During Therapy  [facility] for tasks assessed/performed       Past Medical History:  Diagnosis Date  . Arthritis    knees, back.  . Asthma    2 weeks cold,no problems now-well controlled  . Bursitis, knee    Bil. Hips, not knee  . Complication of anesthesia    slow to wake up  . Diverticulitis of colon 05-28-11   no current problems  . GERD (gastroesophageal reflux disease)    tx. pantoprazole  . Hypertension   . Multiple thyroid nodules   . Pneumonia   . PONV (postoperative nausea and vomiting)   . Sleep apnea    No cpap now -3 months last due to insurance reasons    Past Surgical History:  Procedure Laterality Date  . ABDOMINAL HYSTERECTOMY     partial hyst  . BREAST BIOPSY     several- all benign  . CARDIAC CATHETERIZATION     10'11  . [place]  . FOOT SURGERY     Bilateral  . KNEE ARTHROSCOPY     right '04  . TOTAL KNEE ARTHROPLASTY  06/04/2011   Procedure: TOTAL KNEE ARTHROPLASTY;  Surgeon: Gearlean Alf;  Location: [facility];  Service: Orthopedics;  Laterality: Right;  . TOTAL KNEE ARTHROPLASTY Left 08/08/2015   Procedure: TOTAL LEFT KNEE ARTHROPLASTY;  Surgeon: Gaynelle Arabian, MD;  Location: [facility];  Service: Orthopedics;  Laterality: Left;    There were no vitals filed for this visit.  Subjective Assessment - 10/20/19 1302    Subjective  "Ok I guess" It gets  better at times. Med's help    Currently in Pain?  No/denies                       [facility] Adult PT Treatment/Exercise - 10/20/19 0001      Lumbar Exercises: Supine   Bridge  10 reps;Compliant;2 seconds    Bridge with [employer]  Compliant;10 reps;2 seconds    Other Supine Lumbar Exercises  Hooklying hip abduction yellow band      Ultrasound   Ultrasound Location  RT GT and R SI    Ultrasound Parameters  69mHz 100% 1.1 w/cm2    Ultrasound Goals  Pain      Iontophoresis   Type of Iontophoresis  Dexamethasone    Location  Lateral R knee    Dose  1.2 cc 80 mA patch    Time  4 hour      Manual Therapy   Manual Therapy  Passive ROM;Soft tissue mobilization    Manual therapy comments  very tight and pain limited-verb felt better after stretching    Soft tissue mobilization  ITB and RT lateral quad  PT Short Term Goals - 09/29/19 1157      PT SHORT TERM GOAL #1   Title  independent with initial HEP    Baseline  home stretching    Status  Achieved        PT Long Term Goals - 09/08/19 1426      PT LONG TERM GOAL #1   Title  decrease pain 50%    Time  8    Period  Weeks    Status  New      PT LONG TERM GOAL #2   Title  increase strength of the hips to 4+/5     Time  8    Period  Weeks    Status  New      PT LONG TERM GOAL #3   Title  increase lumbar ROM 25%    Time  8    Period  Weeks    Status  New      PT LONG TERM GOAL #4   Title  walk with minimal deviation    Time  8    Period  Weeks    Status  New            Plan - 10/20/19 1344    Clinical Impression Statement  Positive response after today's session. Ionto patch to R lateral knee today due to pain. Bilateral tight ITB and hamstring noted with passive stretching.    Personal Factors and Comorbidities  Comorbidity 3+    Comorbidities  bilateral TKA's GERD    Rehab Potential  Good    PT Frequency  2x / week    PT Duration  8 weeks    PT  Treatment/Interventions  ADLs/Self Care Home Management;Cryotherapy;Electrical Stimulation;Iontophoresis 4mg /ml Dexamethasone;Moist Heat;Ultrasound;Traction;Functional mobility training;Therapeutic activities;Therapeutic exercise;Patient/family education;Manual techniques;Dry needling    PT Next Visit Plan  progress as tolerated.       Patient will benefit from skilled therapeutic intervention in order to improve the following deficits and impairments:  Pain, Improper body mechanics, Postural dysfunction, Increased muscle spasms, Decreased mobility, Decreased activity tolerance, Decreased range of motion, Decreased strength, Impaired flexibility, Difficulty walking  Visit Diagnosis: Pain in left hip  Difficulty in walking, not elsewhere classified  Acute bilateral low back pain without sciatica  Pain in right hip     Problem List Patient Active Problem List   Diagnosis Date Noted  . OA (osteoarthritis) of knee 08/08/2015  . Osteoarthritis of right knee 06/04/2011    Scot Jun, PTA 10/20/2019, 1:45 PM  [address] [place], [place], [address] Phone: [phone]   Fax:  [phone]  Name: Dawn Collins MRN: [id number] Date of Birth: 1946-04-13

## 2019-10-28 ENCOUNTER — Ambulatory Visit: Payer: Medicare Other | Attending: Internal Medicine

## 2019-10-28 DIAGNOSIS — Z23 Encounter for immunization: Secondary | ICD-10-CM

## 2019-10-28 NOTE — Progress Notes (Signed)
   Covid-19 Vaccination Clinic  Name:  Kellyn Mccary    MRN: [id number] DOB: 05-24-1946  10/28/2019  Ms. Erich was observed post Covid-19 immunization for 15 minutes without incident. She was provided with Vaccine Information Sheet and instruction to access the V-Safe system.   Ms. Lager was instructed to call 911 with any severe reactions post vaccine: Marland Kitchen Difficulty breathing  . Swelling of face and throat  . A fast heartbeat  . A bad rash all over body  . Dizziness and weakness   Immunizations Administered    Name Date Dose VIS Date Route   Pfizer COVID-19 Vaccine 10/28/2019  4:34 PM 0.3 mL 07/03/2019 Intramuscular   Manufacturer: [place]   Lot: [id number]   [place]: [id number]

## 2019-11-03 ENCOUNTER — Ambulatory Visit: Payer: Medicare Other | Admitting: Physical Therapy

## 2020-09-06 ENCOUNTER — Encounter (HOSPITAL_BASED_OUTPATIENT_CLINIC_OR_DEPARTMENT_OTHER): Payer: Self-pay

## 2020-09-06 ENCOUNTER — Emergency Department (HOSPITAL_BASED_OUTPATIENT_CLINIC_OR_DEPARTMENT_OTHER)
Admission: EM | Admit: 2020-09-06 | Discharge: 2020-09-06 | Disposition: A | Discharge status: Home / Self Care | Payer: Medicare Other | Attending: Emergency Medicine | Admitting: Emergency Medicine

## 2020-09-06 ENCOUNTER — Emergency Department (HOSPITAL_BASED_OUTPATIENT_CLINIC_OR_DEPARTMENT_OTHER): Payer: Medicare Other

## 2020-09-06 ENCOUNTER — Other Ambulatory Visit: Payer: Self-pay

## 2020-09-06 DIAGNOSIS — Z7901 Long term (current) use of anticoagulants: Secondary | ICD-10-CM | POA: Diagnosis not present

## 2020-09-06 DIAGNOSIS — Z79899 Other long term (current) drug therapy: Secondary | ICD-10-CM | POA: Insufficient documentation

## 2020-09-06 DIAGNOSIS — I10 Essential (primary) hypertension: Secondary | ICD-10-CM | POA: Diagnosis not present

## 2020-09-06 DIAGNOSIS — K2970 Gastritis, unspecified, without bleeding: Secondary | ICD-10-CM | POA: Diagnosis not present

## 2020-09-06 DIAGNOSIS — Z7951 Long term (current) use of inhaled steroids: Secondary | ICD-10-CM | POA: Diagnosis not present

## 2020-09-06 DIAGNOSIS — Z7982 Long term (current) use of aspirin: Secondary | ICD-10-CM | POA: Insufficient documentation

## 2020-09-06 DIAGNOSIS — Z96653 Presence of artificial knee joint, bilateral: Secondary | ICD-10-CM | POA: Diagnosis not present

## 2020-09-06 DIAGNOSIS — J45909 Unspecified asthma, uncomplicated: Secondary | ICD-10-CM | POA: Diagnosis not present

## 2020-09-06 DIAGNOSIS — R0789 Other chest pain: Secondary | ICD-10-CM | POA: Insufficient documentation

## 2020-09-06 DIAGNOSIS — R079 Chest pain, unspecified: Secondary | ICD-10-CM

## 2020-09-06 LAB — CBC
HCT: 37.2 % (ref 36.0–46.0)
Hemoglobin: 12.7 g/dL (ref 12.0–15.0)
MCH: 30 pg (ref 26.0–34.0)
MCHC: 34.1 g/dL (ref 30.0–36.0)
MCV: 87.9 fL (ref 80.0–100.0)
Platelets: 291 10*3/uL (ref 150–400)
RBC: 4.23 MIL/uL (ref 3.87–5.11)
RDW: 12.4 % (ref 11.5–15.5)
WBC: 7.7 10*3/uL (ref 4.0–10.5)
nRBC: 0 % (ref 0.0–0.2)

## 2020-09-06 LAB — BASIC METABOLIC PANEL
Anion gap: 9 (ref 5–15)
BUN: 11 mg/dL (ref 8–23)
CO2: 24 mmol/L (ref 22–32)
Calcium: 9.2 mg/dL (ref 8.9–10.3)
Chloride: 101 mmol/L (ref 98–111)
Creatinine, Ser: 0.86 mg/dL (ref 0.44–1.00)
GFR, Estimated: >60 mL/min (ref >60)
Glucose, Bld: 111 mg/dL — ABNORMAL HIGH (ref 70–99)
Potassium: 3.3 mmol/L — ABNORMAL LOW (ref 3.5–5.1)
Sodium: 134 mmol/L — ABNORMAL LOW (ref 135–145)

## 2020-09-06 LAB — HEPATIC FUNCTION PANEL
ALT: 14 U/L (ref 0–44)
AST: 21 U/L (ref 15–41)
Albumin: 3.7 g/dL (ref 3.5–5.0)
Alkaline Phosphatase: 48 U/L (ref 38–126)
Bilirubin, Direct: 0.1 mg/dL (ref 0.0–0.2)
Indirect Bilirubin: 0.3 mg/dL (ref 0.3–0.9)
Total Bilirubin: 0.4 mg/dL (ref 0.3–1.2)
Total Protein: 6.9 g/dL (ref 6.5–8.1)

## 2020-09-06 LAB — TROPONIN I (HIGH SENSITIVITY): Troponin I (High Sensitivity): 4 ng/L (ref <18)

## 2020-09-06 LAB — LIPASE, BLOOD: Lipase: 41 U/L (ref 11–51)

## 2020-09-06 MED ORDER — ALUM & MAG HYDROXIDE-SIMETH 200-200-20 MG/5ML PO SUSP
30.0000 mL | Freq: Once | ORAL | Status: AC
  Administered 2020-09-06: 30 mL via ORAL
  Filled 2020-09-06: qty 30

## 2020-09-06 MED ORDER — IOHEXOL 350 MG/ML SOLN
100.0000 mL | Freq: Once | INTRAVENOUS | Status: AC | PRN
  Administered 2020-09-06: 100 mL via INTRAVENOUS

## 2020-09-06 MED ORDER — FAMOTIDINE 20 MG PO TABS: 20.0000 mg | ORAL_TABLET | Freq: Two times a day (BID) | ORAL | 1 refills | Status: AC

## 2020-09-06 NOTE — ED Notes (Signed)
Pt son called for update, obtained pt permission to speak. Update given.

## 2020-09-06 NOTE — ED Notes (Signed)
Patient transported to CT 

## 2020-09-06 NOTE — ED Provider Notes (Signed)
[facility] EMERGENCY DEPARTMENT Provider Note   CSN: [id number] Arrival date & time: 09/06/20  1724     History Chief Complaint  Patient presents with  . Chest Pain    Dawn Collins is a 75 y.o. female with history of hypertension, pneumonia, Covid diagnosis 2 weeks ago, presenting to the emergency department with burning and sharp pain in her chest.  She reports has been ongoing for the past 2 weeks she had Covid.  She describes epigastric and right-sided burning pain that radiates up the middle of her chest.  It comes and goes at times.  It seems to be worse at night.  She does feel short of breath with this.  She has never had this kind of symptoms before.  She does suffer from acid reflux but states this feels very different to her.  She denies any chest pressure.  She denies any history of MI or coronary disease.  She had negative stress test in 2020.  She denies any history of blood clots.  She reports she did find out she had pulmonary nodules a few months ago, and her PCP at [facility] is trying to get her set up for an outpatient CT scan of the lungs.  She denies any smoking history.  She denies any history of abdominal surgery.  HPI     Past Medical History:  Diagnosis Date  . Arthritis    knees, back.  . Asthma    2 weeks cold,no problems now-well controlled  . Bursitis, knee    Bil. Hips, not knee  . Complication of anesthesia    slow to wake up  . Diverticulitis of colon 05-28-11   no current problems  . GERD (gastroesophageal reflux disease)    tx. pantoprazole  . Hypertension   . Multiple thyroid nodules   . Pneumonia   . PONV (postoperative nausea and vomiting)   . Sleep apnea    No cpap now -3 months last due to insurance reasons    Patient Active Problem List   Diagnosis Date Noted  . OA (osteoarthritis) of knee 08/08/2015  . Osteoarthritis of right knee 06/04/2011    Past Surgical History:  Procedure Laterality Date  . ABDOMINAL  HYSTERECTOMY     partial hyst  . BREAST BIOPSY     several- all benign  . CARDIAC CATHETERIZATION     10'11  . [place]  . FOOT SURGERY     Bilateral  . KNEE ARTHROSCOPY     right '04  . TOTAL KNEE ARTHROPLASTY  06/04/2011   Procedure: TOTAL KNEE ARTHROPLASTY;  Surgeon: Gearlean Alf;  Location: [facility];  Service: Orthopedics;  Laterality: Right;  . TOTAL KNEE ARTHROPLASTY Left 08/08/2015   Procedure: TOTAL LEFT KNEE ARTHROPLASTY;  Surgeon: Gaynelle Arabian, MD;  Location: [facility];  Service: Orthopedics;  Laterality: Left;     OB History   No obstetric history on file.     History reviewed. No pertinent family history.  Social History   Tobacco Use  . Smoking status: Never Smoker  . Smokeless tobacco: Never Used  Substance Use Topics  . Alcohol use: No  . Drug use: No    Home Medications Prior to Admission medications   Medication Sig Start Date End Date Taking? Authorizing Provider  acetaminophen (TYLENOL) 500 MG tablet Take 500-1,000 mg by mouth every 6 (six) hours as needed. For arthritis   Yes [provider]  ALBUTEROL IN  Inhale into the lungs.   Yes [provider]  aspirin EC 81 MG tablet Take 81 mg by mouth daily.   Yes [provider]  diltiazem (CARDIZEM CD) 300 MG 24 hr capsule Take 300 mg by mouth daily.   Yes [provider]  estradiol (ESTRACE) 2 MG tablet Take 2 mg by mouth daily.   Yes [provider]  famotidine (PEPCID) 20 MG tablet Take 1 tablet (20 mg total) by mouth 2 (two) times daily. 09/06/20 10/06/20 Yes Karter Hellmer, Carola Rhine, MD  fluticasone (FLONASE) 50 MCG/ACT nasal spray SHAKE LIQUID AND USE 2 SPRAYS IN EACH NOSTRIL DAILY 07/17/17  Yes [provider]  Fluticasone Furoate-Vilanterol (BREO ELLIPTA IN) Inhale into the lungs.   Yes [provider]  gabapentin (NEURONTIN) 300 MG capsule Take 300 mg by mouth daily as needed. pain 06/28/15  Yes [provider]   guaiFENesin (MUCINEX) 600 MG 12 hr tablet Mucinex 600 mg tablet, extended release 10/22/18  Yes [provider]  latanoprost (XALATAN) 0.005 % ophthalmic solution Place 1 drop into both eyes at bedtime. 06/20/15  Yes [provider]  montelukast (SINGULAIR) 10 MG tablet Take 10 mg by mouth at bedtime.   Yes [provider]  pantoprazole (PROTONIX) 40 MG tablet Take 40 mg by mouth daily.   Yes [provider]  amiloride-hydrochlorothiazide (MODURETIC) 5-50 MG tablet Take 1 tablet by mouth daily.    [provider]  apixaban (ELIQUIS) 2.5 MG TABS tablet Take 1 tablet (2.5 mg total) by mouth every 12 (twelve) hours. Take Eliquis twice a day for two and a half more weeks, then discontinue the Eliquis. Once the patient has completed the blood thinner regimen, then take a Baby 81 mg Aspirin daily for three more weeks. 08/09/15   Perkins, Alexzandrew L, PA-C  cetirizine (ZYRTEC) 10 MG tablet Take 10 mg by mouth at bedtime as needed for allergies.    [provider]  hydrochlorothiazide (HYDRODIURIL) 12.5 MG tablet Take 12.5 mg by mouth daily.    [provider]  HYDROmorphone (DILAUDID) 2 MG tablet Take 1-2 tablets (2-4 mg total) by mouth every 4 (four) hours as needed for moderate pain or severe pain. 08/09/15   Perkins, Alexzandrew L, PA-C  losartan (COZAAR) 50 MG tablet Take 50 mg by mouth daily.    [provider]    Allergies    Morphine and related, Avelox [moxifloxacin hcl in nacl], Celebrex [celecoxib], Clarithromycin, Codeine, Food, Hycodan [hydrocodone-homatropine], Pentazocine, Septra [bactrim], Sertraline hcl, Stadol [butorphanol tartrate], Duraphen [phenylephrine-guaifenesin], Floxin [ofloxacin], and Penicillins  Review of Systems   Review of Systems  Constitutional: Negative for chills and fever.  HENT: Negative for ear pain and sore throat.   Eyes: Negative for pain and visual disturbance.  Respiratory: Positive for  shortness of breath. Negative for cough.   Cardiovascular: Positive for chest pain. Negative for palpitations.  Gastrointestinal: Positive for abdominal pain and nausea. Negative for vomiting.  Musculoskeletal: Negative for arthralgias and back pain.  Skin: Negative for color change and rash.  Neurological: Negative for syncope and headaches.  All other systems reviewed and are negative.   Physical Exam Updated Vital Signs BP (!) 185/60 (BP Location: Right Arm)   Pulse 69   Temp 97.8 F (36.6 C) (Oral)   Resp 16   Ht 5' 5.75" (1.67 m)   Wt 71.7 kg   SpO2 100%   BMI 25.70 kg/m   Physical Exam Constitutional:      General: She  is not in acute distress. HENT:     Head: Normocephalic and atraumatic.  Eyes:     Conjunctiva/sclera: Conjunctivae normal.     Pupils: Pupils are equal, round, and reactive to light.  Cardiovascular:     Rate and Rhythm: Normal rate and regular rhythm.  Pulmonary:     Effort: Pulmonary effort is normal. No respiratory distress.  Abdominal:     General: There is no distension.     Tenderness: There is abdominal tenderness in the epigastric area. There is no guarding or rebound. Negative signs include Murphy's sign and McBurney's sign.  Skin:    General: Skin is warm and dry.  Neurological:     General: No focal deficit present.     Mental Status: She is alert. Mental status is at baseline.  Psychiatric:        Mood and Affect: Mood normal.        Behavior: Behavior normal.     ED Results / Procedures / Treatments   Labs (all labs ordered are listed, but only abnormal results are displayed) Labs Reviewed  BASIC METABOLIC PANEL - Abnormal; Notable for the following components:      Result Value   Sodium 134 (*)    Potassium 3.3 (*)    Glucose, Bld 111 (*)    All other components within normal limits  CBC  HEPATIC FUNCTION PANEL  LIPASE, BLOOD  TROPONIN I (HIGH SENSITIVITY)    EKG EKG Interpretation  Date/Time:  Tuesday September 06 2020 17:33:49 EST Ventricular Rate:  83 PR Interval:    QRS Duration: 98 QT Interval:  368 QTC Calculation: 433 R Axis:   -28 Text Interpretation: Sinus rhythm Borderline left axis deviation Abnormal R-wave progression, early transition No sig change from Nov 2019 ecg No STEMI Confirmed by Octaviano Glow [phone]) on 09/06/2020 5:48:12 PM   Radiology CT Angio Chest PE W and/or Wo Contrast  Result Date: 09/06/2020 CLINICAL DATA:  Pleuritic right-sided chest pain, history of COVID 2 weeks ago. EXAM: CT ANGIOGRAPHY CHEST WITH CONTRAST TECHNIQUE: Multidetector CT imaging of the chest was performed using the standard protocol during bolus administration of intravenous contrast. Multiplanar CT image reconstructions and MIPs were obtained to evaluate the vascular anatomy. CONTRAST:  160mL OMNIPAQUE IOHEXOL 350 MG/ML SOLN COMPARISON:  Chest radiograph dated 10/16/2018 and chest CT dated 01/02/2016. FINDINGS: Cardiovascular: Satisfactory opacification of the pulmonary arteries to the segmental level. No evidence of pulmonary embolism. Vascular calcifications are seen in the coronary arteries and aortic arch. Normal heart size. No pericardial effusion. Mediastinum/Nodes: No enlarged mediastinal, hilar, or axillary lymph nodes. Thyroid gland, trachea, and esophagus demonstrate no significant findings. Lungs/Pleura: A 7 mm solid pulmonary nodule in the right middle lobe is noted (series 5, image 50). A 4 mm solid pulmonary nodule in the right upper lobe is not significantly changed since 01/02/2016 and therefore benign. There is mild right lower lobe atelectasis adjacent to degenerative disc disease in the thoracic spine. No focal consolidation in either lung. No pleural effusion or pneumothorax. Upper Abdomen: No acute abnormality. Musculoskeletal: Degenerative changes are seen in the spine. Review of the MIP images confirms the above findings. IMPRESSION: 1. No evidence of pulmonary embolism. 2. A 7 mm pulmonary  nodule in the right middle lobe is indeterminate. Non-contrast chest CT at 6-12 months is recommended. If the nodule is stable at time of repeat CT, then future CT at 18-24 months (from today's scan) is considered optional for low-risk patients, but is recommended for  high-risk patients. This recommendation follows the consensus statement: Guidelines for Management of Incidental Pulmonary Nodules Detected on CT Images: From the Fleischner Society 2017; Radiology 2017; 284:228-243. Aortic Atherosclerosis (ICD10-I70.0). Electronically Signed   By: Zerita Boers M.D.   On: 09/06/2020 19:33    Procedures Procedures   Medications Ordered in ED Medications  alum & mag hydroxide-simeth (MAALOX/MYLANTA) 200-200-20 MG/5ML suspension 30 mL (30 mLs Oral Given 09/06/20 1828)  iohexol (OMNIPAQUE) 350 MG/ML injection 100 mL (100 mLs Intravenous Contrast Given 09/06/20 1849)    ED Course  I have reviewed the triage vital signs and the nursing notes.  Pertinent labs & imaging results that were available during my care of the patient were reviewed by me and considered in my medical decision making (see chart for details).  This patient presents to the Emergency Department with complaint of chest pain. This involves an extensive number of treatment options, and is a complaint that carries with it a high risk of complications and morbidity.  The differential diagnosis includes ACS vs Pneumothorax vs PE vs Reflux/Gastritis vs MSK pain vs Pneumonia vs other.  Hx is most consistent with reflux/gastritis, possibly 2/2 viral infection with Covid recently (2 weeks ago).   I ordered, reviewed, and interpreted labs, including BMP and CBC.  There were no immediate, life-threatening emergencies found in this labwork.  The patient's troponin level was 4 with 2 weeks of symptoms - less likely ACS at this time I ordered medication GI cocktail for chest pain I ordered imaging studies which included CT PE  I independently  visualized and interpreted imaging which showed multiple pulmonary nodules, no evident PNA or PTX, no PE, and the monitor tracing which showed NSR I personally reviewed the patients ECG which showed sinus rhythm with no acute ischemic findings  After the interventions stated above, I reevaluated the patient and found that they remained clinically stable.  Her chest pain had completely resolved after the GI cocktail.  Based on the patient's clinical exam, vital signs, risk factors, and ED testing, I felt that the patient's overall risk of life-threatening emergency such as ACS, PE, sepsis, or infection was low.  At this time, I felt the patient's presentation was most clinically consistent with gastritis, but explained to the patient that this evaluation was not a definitive diagnostic workup.  I discussed outpatient follow up with primary care provider.  Return precautions were discussed with the patient.  I felt the patient was clinically stable for discharge.  I did discuss her pulmonary nodules with her.  She will f/u with her PCP regarding this and repeat imaging in the future.  Clinical Course as of 09/07/20 1121  Tue Sep 06, 2020  1949 Pt had relief of her symptoms after GI cocktail.  Suspect this may be gastritis.  Will start on pepcid.  Ok to d/c with PCP f/u. [MT]    Clinical Course User Index [MT] Wyvonnia Dusky, MD    Final Clinical Impression(s) / ED Diagnoses Final diagnoses:  Chest pain, unspecified type  Gastritis, presence of bleeding unspecified, unspecified chronicity, unspecified gastritis type    Rx / DC Orders ED Discharge Orders         Ordered    famotidine (PEPCID) 20 MG tablet  2 times daily        09/06/20 1950           Wyvonnia Dusky, MD 09/07/20 1121

## 2020-09-06 NOTE — ED Triage Notes (Signed)
Pt arrives with intermittent burning in her chest, under her breasts and up her neck for the past week. Also has had some palpitations.

## 2020-09-19 IMAGING — CR DG CHEST 2V
2 series · 2 of 2 positions shown · non-contrast
Comparison: 04/02/2016

CLINICAL DATA: Cough and congestion for 1 week

EXAM:
CHEST - 2 VIEW

[w chest pa]
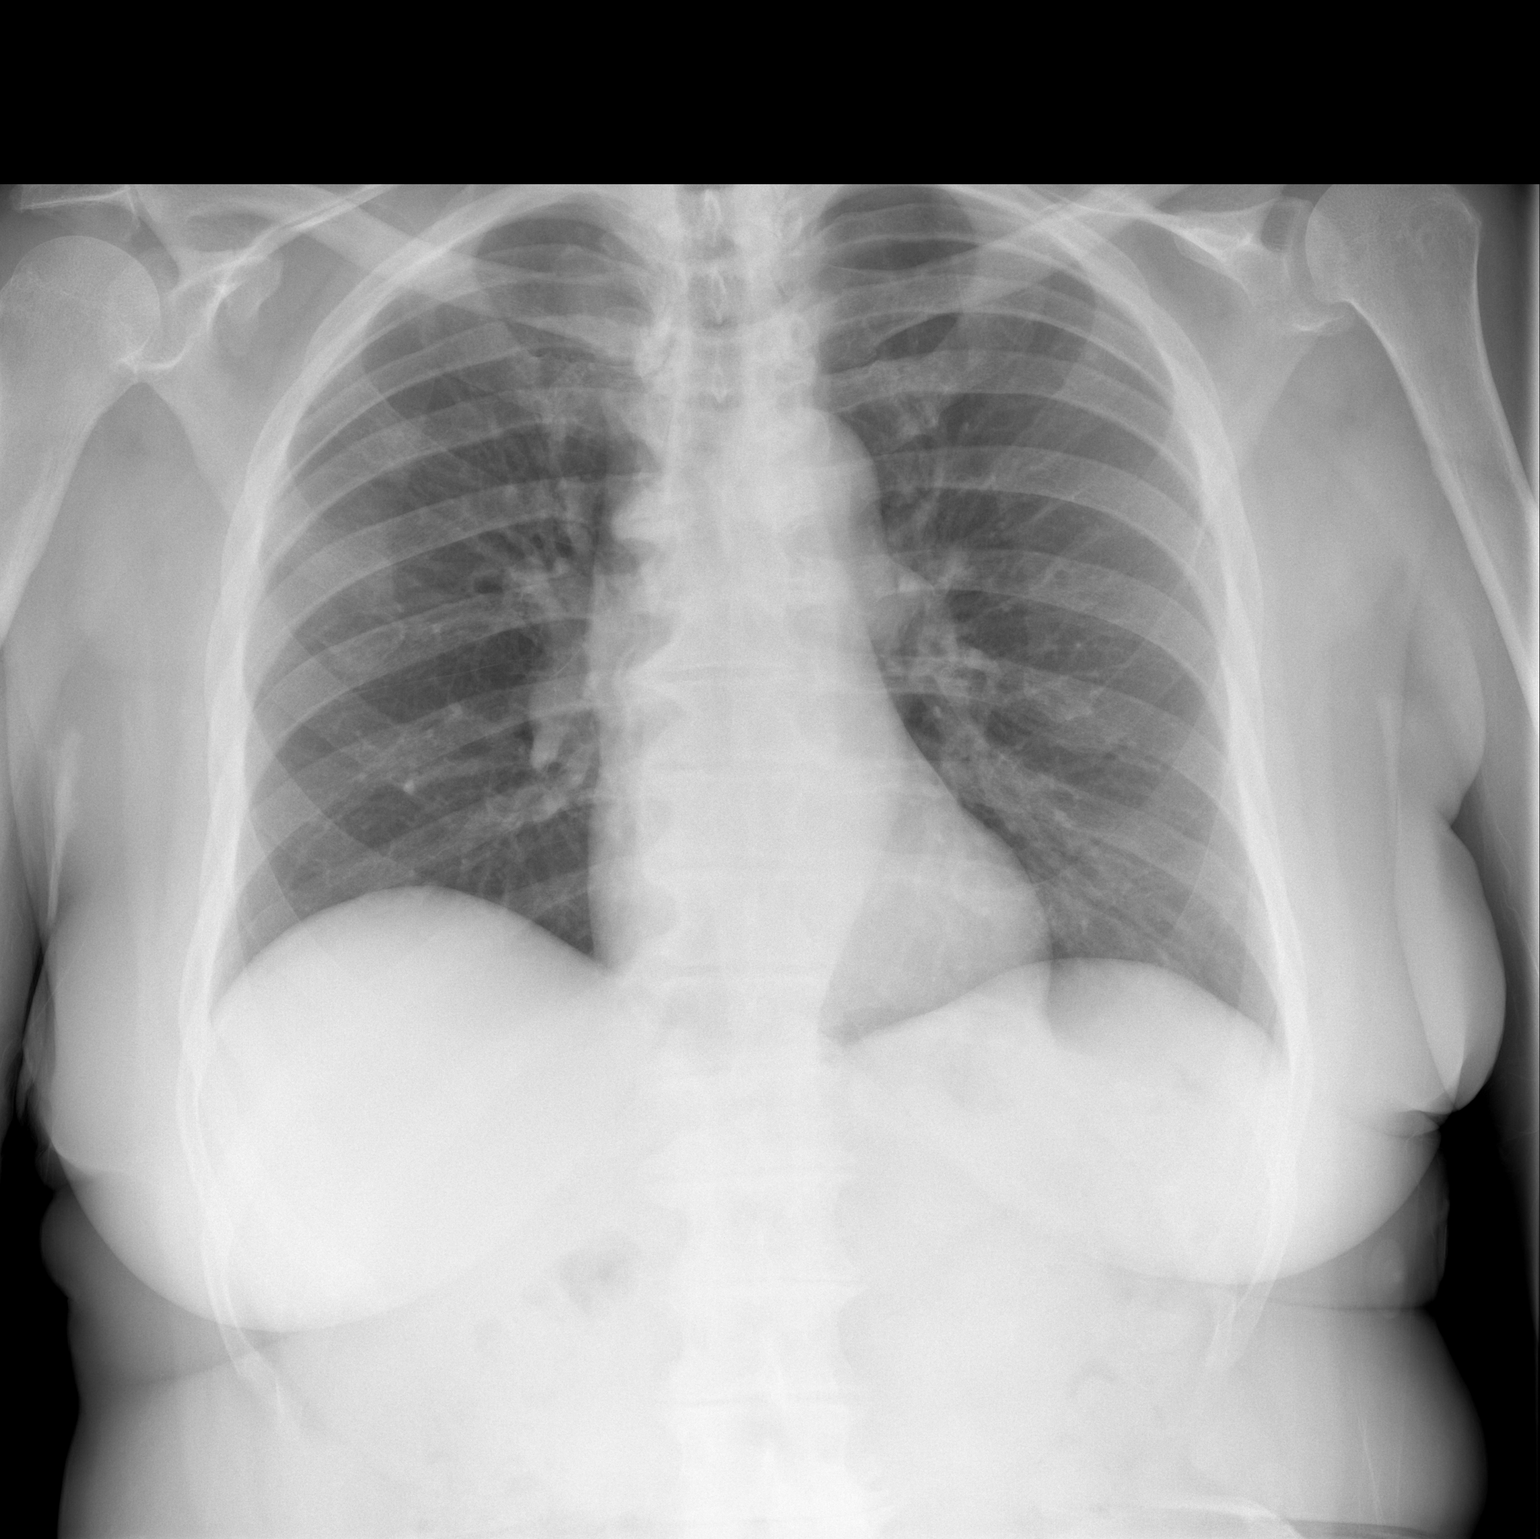

[w chest lat]
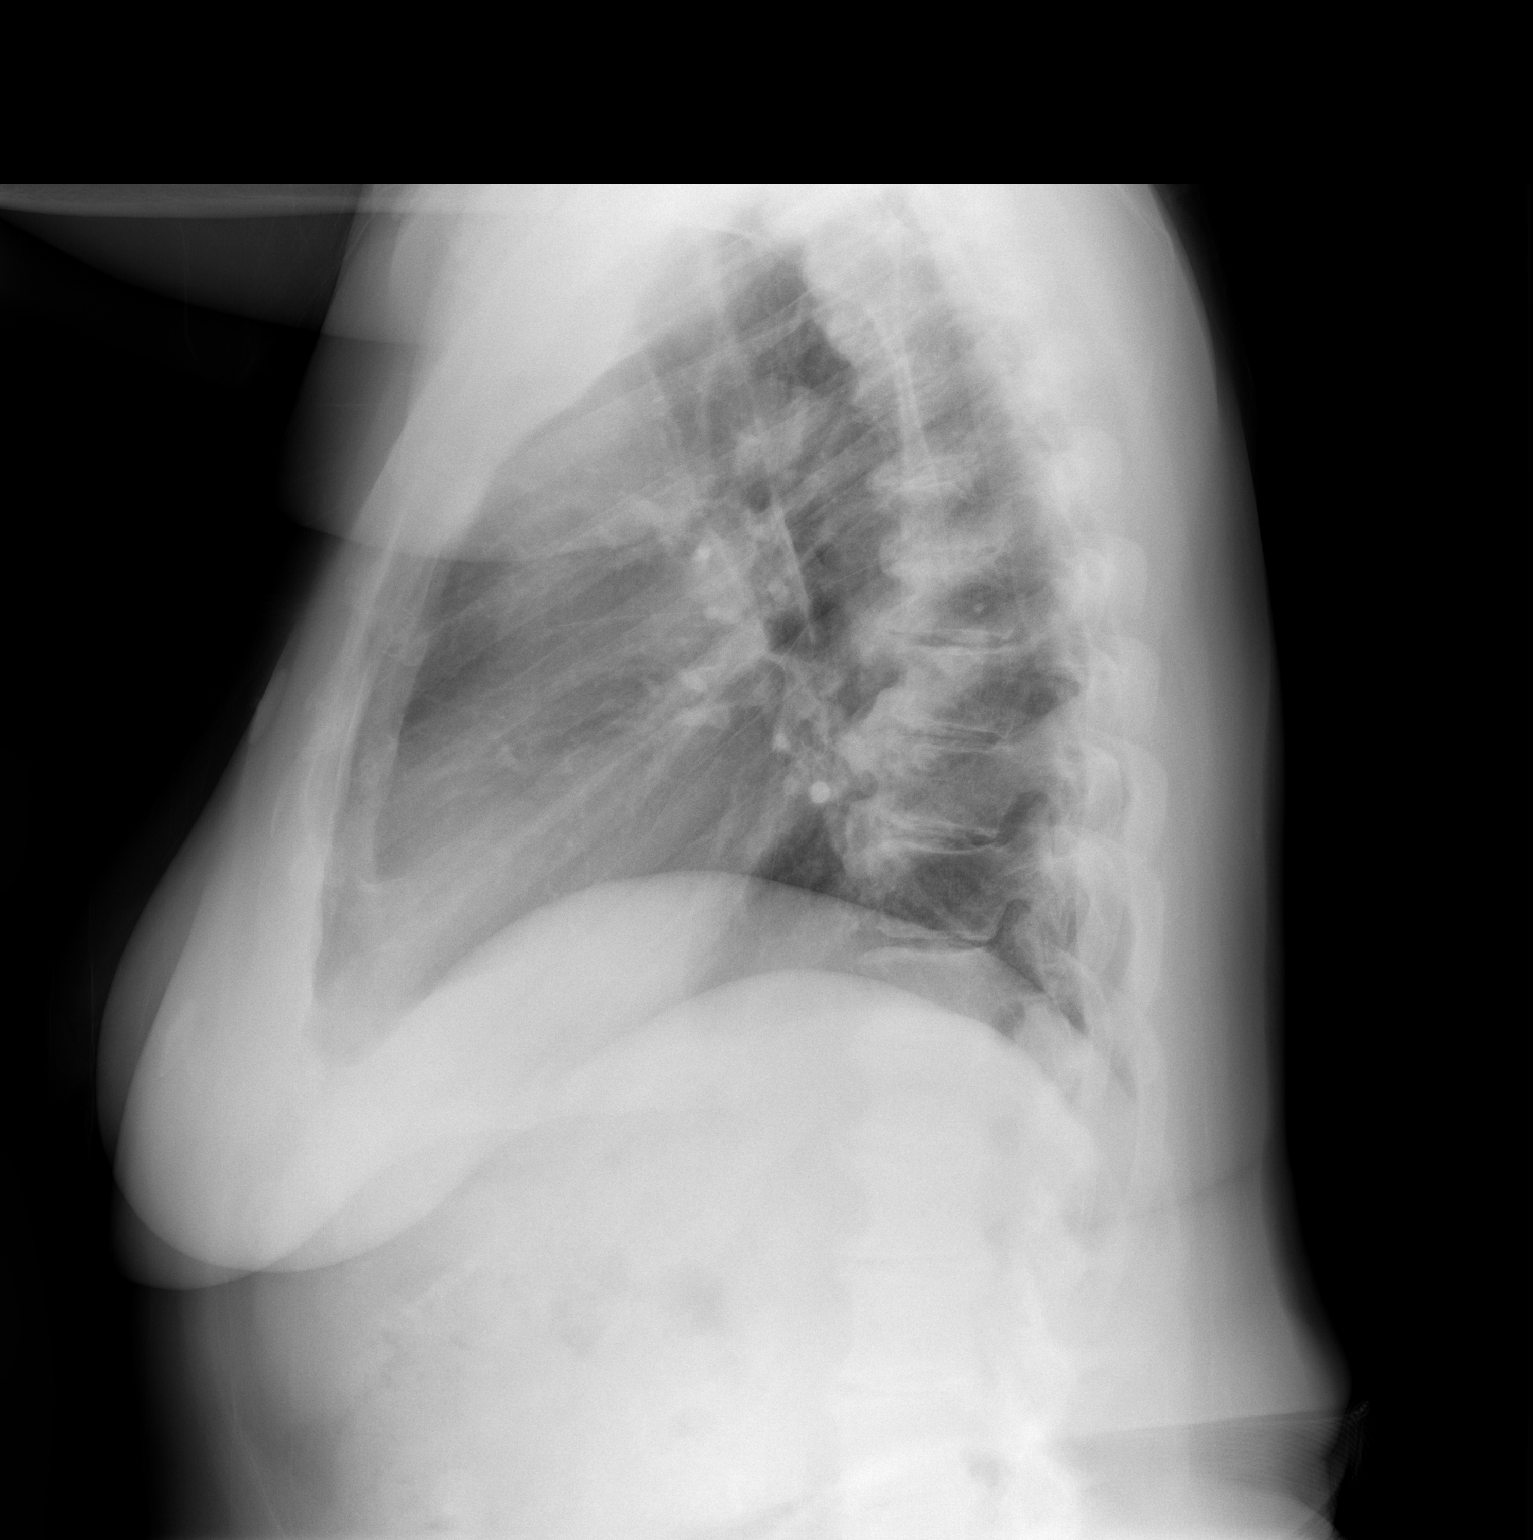

[2 of 2 positions shown; findings below may reference images not displayed]

FINDINGS: Cardiac shadows within normal limits. The lungs are well aerated
bilaterally. No focal infiltrate or sizable effusion is noted.
Degenerative changes of the thoracic spine are seen.
IMPRESSION: No acute abnormality noted.

## 2021-02-27 ENCOUNTER — Other Ambulatory Visit: Payer: Self-pay

## 2021-02-27 ENCOUNTER — Emergency Department (HOSPITAL_BASED_OUTPATIENT_CLINIC_OR_DEPARTMENT_OTHER): Payer: Medicare Other

## 2021-02-27 ENCOUNTER — Emergency Department (HOSPITAL_BASED_OUTPATIENT_CLINIC_OR_DEPARTMENT_OTHER)
Admission: EM | Admit: 2021-02-27 | Discharge: 2021-02-27 | Disposition: A | Discharge status: Home / Self Care | Payer: Medicare Other | Attending: Emergency Medicine | Admitting: Emergency Medicine

## 2021-02-27 ENCOUNTER — Other Ambulatory Visit (HOSPITAL_BASED_OUTPATIENT_CLINIC_OR_DEPARTMENT_OTHER): Payer: Self-pay

## 2021-02-27 DIAGNOSIS — I10 Essential (primary) hypertension: Secondary | ICD-10-CM | POA: Diagnosis not present

## 2021-02-27 DIAGNOSIS — Z79899 Other long term (current) drug therapy: Secondary | ICD-10-CM | POA: Insufficient documentation

## 2021-02-27 DIAGNOSIS — J45901 Unspecified asthma with (acute) exacerbation: Secondary | ICD-10-CM | POA: Diagnosis not present

## 2021-02-27 DIAGNOSIS — R0602 Shortness of breath: Secondary | ICD-10-CM | POA: Diagnosis present

## 2021-02-27 DIAGNOSIS — Z7951 Long term (current) use of inhaled steroids: Secondary | ICD-10-CM | POA: Insufficient documentation

## 2021-02-27 DIAGNOSIS — Z7982 Long term (current) use of aspirin: Secondary | ICD-10-CM | POA: Insufficient documentation

## 2021-02-27 DIAGNOSIS — Z96653 Presence of artificial knee joint, bilateral: Secondary | ICD-10-CM | POA: Diagnosis not present

## 2021-02-27 DIAGNOSIS — Z7901 Long term (current) use of anticoagulants: Secondary | ICD-10-CM | POA: Diagnosis not present

## 2021-02-27 LAB — COMPREHENSIVE METABOLIC PANEL
ALT: 18 U/L (ref 0–44)
AST: 20 U/L (ref 15–41)
Albumin: 4.1 g/dL (ref 3.5–5.0)
Alkaline Phosphatase: 48 U/L (ref 38–126)
Anion gap: 11 (ref 5–15)
BUN: 14 mg/dL (ref 8–23)
CO2: 26 mmol/L (ref 22–32)
Calcium: 9.4 mg/dL (ref 8.9–10.3)
Chloride: 99 mmol/L (ref 98–111)
Creatinine, Ser: 0.71 mg/dL (ref 0.44–1.00)
GFR, Estimated: >60 mL/min (ref >60)
Glucose, Bld: 119 mg/dL — ABNORMAL HIGH (ref 70–99)
Potassium: 3.4 mmol/L — ABNORMAL LOW (ref 3.5–5.1)
Sodium: 136 mmol/L (ref 135–145)
Total Bilirubin: 0.6 mg/dL (ref 0.3–1.2)
Total Protein: 7.8 g/dL (ref 6.5–8.1)

## 2021-02-27 LAB — CBC WITH DIFFERENTIAL/PLATELET
Abs Immature Granulocytes: 0.1 10*3/uL — ABNORMAL HIGH (ref 0.00–0.07)
Basophils Absolute: 0 10*3/uL (ref 0.0–0.1)
Basophils Relative: 0 %
Eosinophils Absolute: 0 10*3/uL (ref 0.0–0.5)
Eosinophils Relative: 0 %
HCT: 40.9 % (ref 36.0–46.0)
Hemoglobin: 14.5 g/dL (ref 12.0–15.0)
Immature Granulocytes: 1 %
Lymphocytes Relative: 16 %
Lymphs Abs: 1.7 10*3/uL (ref 0.7–4.0)
MCH: 31.5 pg (ref 26.0–34.0)
MCHC: 35.5 g/dL (ref 30.0–36.0)
MCV: 88.7 fL (ref 80.0–100.0)
Monocytes Absolute: 0.7 10*3/uL (ref 0.1–1.0)
Monocytes Relative: 7 %
Neutro Abs: 8.1 10*3/uL — ABNORMAL HIGH (ref 1.7–7.7)
Neutrophils Relative %: 76 %
Platelets: 326 10*3/uL (ref 150–400)
RBC: 4.61 MIL/uL (ref 3.87–5.11)
RDW: 12.8 % (ref 11.5–15.5)
WBC: 10.6 10*3/uL — ABNORMAL HIGH (ref 4.0–10.5)
nRBC: 0 % (ref 0.0–0.2)

## 2021-02-27 LAB — URINALYSIS, ROUTINE W REFLEX MICROSCOPIC
Bilirubin Urine: NEGATIVE
Glucose, UA: NEGATIVE mg/dL
Hgb urine dipstick: NEGATIVE
Ketones, ur: NEGATIVE mg/dL
Leukocytes,Ua: NEGATIVE
Nitrite: NEGATIVE
Protein, ur: NEGATIVE mg/dL
Specific Gravity, Urine: 1.01 (ref 1.005–1.030)
pH: 7 (ref 5.0–8.0)

## 2021-02-27 LAB — TROPONIN I (HIGH SENSITIVITY)
Troponin I (High Sensitivity): 6 ng/L (ref <18)
Troponin I (High Sensitivity): 6 ng/L (ref <18)

## 2021-02-27 LAB — BRAIN NATRIURETIC PEPTIDE: B Natriuretic Peptide: 331.5 pg/mL — ABNORMAL HIGH (ref 0.0–100.0)

## 2021-02-27 MED ORDER — PROMETHAZINE-DM 6.25-15 MG/5ML PO SYRP
5.0000 mL | ORAL_SOLUTION | Freq: Four times a day (QID) | ORAL | 0 refills | Status: DC | PRN
  Filled 2021-02-27: qty 180, 9d supply, fill #0

## 2021-02-27 MED ORDER — FLUTICASONE FUROATE-VILANTEROL 100-25 MCG/INH IN AEPB
1.0000 | INHALATION_SPRAY | Freq: Every day | RESPIRATORY_TRACT | 0 refills | Status: AC
  Filled 2021-02-27: qty 60, 30d supply, fill #0

## 2021-02-27 NOTE — Discharge Instructions (Addendum)
All the blood work was OK today and you chest Xray was normal.  Suspect that your shortness of breath is most likely related to your asthma.  Given the heat and poor air quality.  Start back on your [place], continue to use your albuterol inhaler as needed and you can also use the cough medicine.  If you start having worsening shortness of breath that does not go away, significant chest pain, passing out, fever then please return to the emergency room for recheck.

## 2021-02-27 NOTE — ED Provider Notes (Signed)
[facility] Provider Note   CSN: [id number] Arrival date & time: 02/27/21  1120     History Chief Complaint  Patient presents with   Shortness of Breath    Dawn Collins is a 75 y.o. female.  Patient is a 75 year old female with a history of asthma, GERD, hypertension who is presenting today due to several episodes of shortness of breath.  Patient reports the first episode occurred last week but she used her inhaler and it resolved.  However yesterday she had 2 episodes that concerned her.  The first 1 was when she was going to the nursing home to visit a relative and she was walking in with her hands full and by the time she got in the door she was extremely winded and had to sit down for 3 or 4 minutes to catch her breath.  After that she reports her symptoms went away and she was able to visit with her family member and walk to various other places in the nursing home without difficulty.  She got home yesterday evening and had to take her garbage down to the end of the driveway and she reports by the time she got it all the way down and was starting to walk back to the house she started feeling the same sensation of severe shortness of breath.  She had some slight chest tightness with both of these episodes.  Symptoms again resolved quite quickly.  She felt very hot during both of these episodes where she had been outside and had been walking.  She has not had any nausea or vomiting.  She denies having any unusual sensation of her chest currently but said there may have been a little unusual feeling this morning.  She has had a slightly worsening cough recently but only clear mucus.  She has not noticed any wheezing.  She denies any shortness of breath currently.  She has taken all of her medications this morning and has not had any recent change in her medications.  She has had no lower extremity swelling or abdominal distention.  No unilateral leg pain or swelling  or prior history of PE or DVT.  He has had prior stress tests which have all been negative most recently in 2020.  The history is provided by the patient.  Shortness of Breath     Past Medical History:  Diagnosis Date   Arthritis    knees, back.   Asthma    2 weeks cold,no problems now-well controlled   Bursitis, knee    Bil. Hips, not knee   Complication of anesthesia    slow to wake up   Diverticulitis of colon 05-28-11   no current problems   GERD (gastroesophageal reflux disease)    tx. pantoprazole   Hypertension    Multiple thyroid nodules    Pneumonia    PONV (postoperative nausea and vomiting)    Sleep apnea    No cpap now -3 months last due to insurance reasons    Patient Active Problem List   Diagnosis Date Noted   OA (osteoarthritis) of knee 08/08/2015   Osteoarthritis of right knee 06/04/2011    Past Surgical History:  Procedure Laterality Date   ABDOMINAL HYSTERECTOMY     partial hyst   BREAST BIOPSY     several- all benign   CARDIAC CATHETERIZATION     10'11   CERVICAL [place]  Bilateral   KNEE ARTHROSCOPY     right '04   TOTAL KNEE ARTHROPLASTY  06/04/2011   Procedure: TOTAL KNEE ARTHROPLASTY;  Surgeon: Gearlean Alf;  Location: [facility];  Service: Orthopedics;  Laterality: Right;   TOTAL KNEE ARTHROPLASTY Left 08/08/2015   Procedure: TOTAL LEFT KNEE ARTHROPLASTY;  Surgeon: Gaynelle Arabian, MD;  Location: [facility];  Service: Orthopedics;  Laterality: Left;     OB History   No obstetric history on file.     No family history on file.  Social History   Tobacco Use   Smoking status: Never   Smokeless tobacco: Never  Substance Use Topics   Alcohol use: No   Drug use: No    Home Medications Prior to Admission medications   Medication Sig Start Date End Date Taking? Authorizing Provider  acetaminophen (TYLENOL) 500 MG tablet Take 500-1,000 mg by mouth every 6 (six) hours as needed. For arthritis    [provider]  ALBUTEROL IN Inhale into the lungs.    [provider]  amiloride-hydrochlorothiazide (MODURETIC) 5-50 MG tablet Take 1 tablet by mouth daily.    [provider]  apixaban (ELIQUIS) 2.5 MG TABS tablet Take 1 tablet (2.5 mg total) by mouth every 12 (twelve) hours. Take Eliquis twice a day for two and a half more weeks, then discontinue the Eliquis. Once the patient has completed the blood thinner regimen, then take a Baby 81 mg Aspirin daily for three more weeks. 08/09/15   Perkins, Alexzandrew L, PA-C  aspirin EC 81 MG tablet Take 81 mg by mouth daily.    [provider]  cetirizine (ZYRTEC) 10 MG tablet Take 10 mg by mouth at bedtime as needed for allergies.    [provider]  diltiazem (CARDIZEM CD) 300 MG 24 hr capsule Take 300 mg by mouth daily.    [provider]  estradiol (ESTRACE) 2 MG tablet Take 2 mg by mouth daily.    [provider]  famotidine (PEPCID) 20 MG tablet Take 1 tablet (20 mg total) by mouth 2 (two) times daily. 09/06/20 10/06/20  Wyvonnia Dusky, MD  fluticasone (FLONASE) 50 MCG/ACT nasal spray SHAKE LIQUID AND USE 2 SPRAYS IN [facility] NOSTRIL DAILY 07/17/17   [provider]  Fluticasone Furoate-Vilanterol (BREO ELLIPTA IN) Inhale into the lungs.    [provider]  gabapentin (NEURONTIN) 300 MG capsule Take 300 mg by mouth daily as needed. pain 06/28/15   [provider]  guaiFENesin (MUCINEX) 600 MG 12 hr tablet Mucinex 600 mg tablet, extended release 10/22/18   [provider]  hydrochlorothiazide (HYDRODIURIL) 12.5 MG tablet Take 12.5 mg by mouth daily.    [provider]  HYDROmorphone (DILAUDID) 2 MG tablet Take 1-2 tablets (2-4 mg total) by mouth every 4 (four) hours as needed for moderate pain or severe pain. 08/09/15   Perkins, Alexzandrew L, PA-C  latanoprost (XALATAN) 0.005 % ophthalmic solution Place 1 drop into both eyes at bedtime. 06/20/15   [provider]  losartan (COZAAR) 50 MG tablet Take 50 mg by mouth daily.    [provider]  montelukast (SINGULAIR) 10 MG tablet Take 10 mg by mouth at bedtime.    [provider]  pantoprazole (PROTONIX) 40 MG tablet Take 40 mg by mouth daily.    [provider]    Allergies    Morphine and related, Avelox [moxifloxacin hcl in nacl], Celebrex [celecoxib], Clarithromycin, Codeine, Food, Hycodan [hydrocodone bit-homatrop mbr], Pentazocine, Septra [  bactrim], Sertraline hcl, Stadol [butorphanol tartrate], Duraphen [phenylephrine-guaifenesin], Floxin [ofloxacin], and Penicillins  Review of Systems   Review of Systems  Respiratory:  Positive for shortness of breath.   All other systems reviewed and are negative.  Physical Exam Updated Vital Signs BP (!) 201/73 (BP Location: Left Arm)   Pulse 79   Temp 98.4 F (36.9 C) (Oral)   Resp 16   Ht 5\' 5"  (1.651 m)   Wt 71.7 kg   SpO2 100%   BMI 26.29 kg/m   Physical Exam Vitals and nursing note reviewed.  Constitutional:      General: She is not in acute distress.    Appearance: She is well-developed.  HENT:     Head: Normocephalic and atraumatic.  Eyes:     Conjunctiva/sclera: Conjunctivae normal.     Pupils: Pupils are equal, round, and reactive to light.  Cardiovascular:     Rate and Rhythm: Normal rate and regular rhythm.     Heart sounds: No murmur heard. Pulmonary:     Effort: Pulmonary effort is normal. No respiratory distress.     Breath sounds: Normal breath sounds. No wheezing or rales.  Abdominal:     General: There is no distension.     Palpations: Abdomen is soft.     Tenderness: There is no abdominal tenderness. There is no guarding or rebound.  Musculoskeletal:        General: No tenderness. Normal range of motion.     Cervical back: Normal range of motion and neck supple.     Right lower leg: No tenderness. No edema.     Left lower leg: No tenderness. No edema.  Skin:    General:  Skin is warm and dry.     Capillary Refill: Capillary refill takes less than 2 seconds.     Findings: No erythema or rash.  Neurological:     General: No focal deficit present.     Mental Status: She is alert and oriented to person, place, and time. Mental status is at baseline.  Psychiatric:        Mood and Affect: Mood normal.        Behavior: Behavior normal.    ED Results / Procedures / Treatments   Labs (all labs ordered are listed, but only abnormal results are displayed) Labs Reviewed  CBC WITH DIFFERENTIAL/PLATELET - Abnormal; Notable for the following components:      Result Value   WBC 10.6 (*)    Neutro Abs 8.1 (*)    Abs Immature Granulocytes 0.10 (*)    All other components within normal limits  COMPREHENSIVE METABOLIC PANEL - Abnormal; Notable for the following components:   Potassium 3.4 (*)    Glucose, Bld 119 (*)    All other components within normal limits  BRAIN NATRIURETIC PEPTIDE - Abnormal; Notable for the following components:   B Natriuretic Peptide 331.5 (*)    All other components within normal limits  URINALYSIS, ROUTINE W REFLEX MICROSCOPIC  TROPONIN I (HIGH SENSITIVITY)  TROPONIN I (HIGH SENSITIVITY)    EKG EKG Interpretation  Date/Time:  Monday February 27 2021 11:37:22 EDT Ventricular Rate:  71 PR Interval:  113 QRS Duration: 98 QT Interval:  361 QTC Calculation: 393 R Axis:   5 Text Interpretation: Sinus rhythm new Ventricular premature complex Borderline short PR interval RSR' in V1 or V2, right VCD or RVH Confirmed by Blanchie Dessert [phone]) on 02/27/2021 11:47:47 AM  Radiology DG Chest Port 1 View  Result Date:  02/27/2021 CLINICAL DATA:  Dyspnea on exertion. EXAM: PORTABLE CHEST 1 VIEW COMPARISON:  10/16/2018 FINDINGS: Both lungs are clear. Negative for a pneumothorax. Heart and mediastinum are within normal limits. Trachea is midline. Bone structures are intact. IMPRESSION: No active disease. Electronically Signed   By: Markus Daft M.D.    On: 02/27/2021 12:05    Procedures Procedures   Medications Ordered in ED Medications - No data to display  ED Course  I have reviewed the triage vital signs and the nursing notes.  Pertinent labs & imaging results that were available during my care of the patient were reviewed by me and considered in my medical decision making (see chart for details).    MDM Rules/Calculators/A&P                           Patient is an elderly female presenting today with 2 separate episodes of dyspnea on exertion and some slight chest tightness.  She currently is asymptomatic at this time.  Patient is hypertensive here but reports when she was checking her blood pressure at home today it was not that elevated and she did take her medications.  She is reporting feeling very nervous.  Patient's symptoms could be related to her asthma versus stable angina versus CHF versus anemia versus pneumonia.  Low suspicion for COVID at this time.  Patient has had slight increase in a cough but denies any other symptoms.  She did not try using her inhaler during the 2 episode she had yesterday but is not wheezing currently on exam.  Oxygen saturation is normal and other than hypertension and the rest of her vital signs are normal.  Low suspicion for PE today.   EKG today with occasional PVCs but no other acute changes.  Labs and imaging are pending.  3:09 PM Today EKG, chest x-ray, CBC, CMP and delta troponin are all within normal limits.  BNP is slightly elevated from 150-300 over a span of 5 years but no evidence of fluid overload on exam or on x-ray today.  Suspect patient's symptoms are most likely related to her asthma however instructed her to follow-up with her PCP as she may need more cardiac evaluation in the future.  She also reports that she had stopped taking her Breo when she felt better and feel that that could be contributing to her shortness of breath as well.  Patient is requesting a new prescription for  Breo and her cough medicine.  She was given return precautions.  MDM   Amount and/or Complexity of Data Reviewed Clinical lab tests: reviewed and ordered Tests in the radiology section of CPT: ordered and reviewed Tests in the medicine section of CPT: ordered and reviewed Independent visualization of images, tracings, or specimens: yes    Final Clinical Impression(s) / ED Diagnoses Final diagnoses:  Shortness of breath  Moderate asthma with exacerbation, unspecified whether persistent    Rx / DC Orders ED Discharge Orders          Ordered    fluticasone furoate-vilanterol (BREO ELLIPTA) 100-25 MCG/INH AEPB  Daily        02/27/21 1509    promethazine-dextromethorphan (PROMETHAZINE-DM) 6.25-15 MG/5ML syrup  4 times daily PRN        02/27/21 1509             Blanchie Dessert, MD 02/27/21 1513

## 2021-02-27 NOTE — ED Notes (Signed)
Ambulated on r/a HR 95, SpO2 98-99%.

## 2021-02-27 NOTE — ED Notes (Signed)
Pt states she has been voiding frequently also, ua sent to lab

## 2021-02-27 NOTE — ED Triage Notes (Signed)
"  Shortness of breath, worse with exertion x 2 days, I have a history of asthma, but this is not the same" per pt  Denies cold symptoms or cough, denies fever.

## 2022-02-09 ENCOUNTER — Other Ambulatory Visit: Payer: Self-pay

## 2022-02-09 ENCOUNTER — Emergency Department (HOSPITAL_BASED_OUTPATIENT_CLINIC_OR_DEPARTMENT_OTHER)
Admission: EM | Admit: 2022-02-09 | Discharge: 2022-02-09 | Disposition: A | Discharge status: Home / Self Care | Payer: Medicare Other | Source: Home / Self Care | Attending: Emergency Medicine | Admitting: Emergency Medicine

## 2022-02-09 ENCOUNTER — Emergency Department (HOSPITAL_BASED_OUTPATIENT_CLINIC_OR_DEPARTMENT_OTHER): Payer: Medicare Other

## 2022-02-09 ENCOUNTER — Encounter (HOSPITAL_BASED_OUTPATIENT_CLINIC_OR_DEPARTMENT_OTHER): Payer: Self-pay

## 2022-02-09 DIAGNOSIS — R0789 Other chest pain: Secondary | ICD-10-CM

## 2022-02-09 DIAGNOSIS — Z7982 Long term (current) use of aspirin: Secondary | ICD-10-CM | POA: Insufficient documentation

## 2022-02-09 DIAGNOSIS — N390 Urinary tract infection, site not specified: Secondary | ICD-10-CM | POA: Insufficient documentation

## 2022-02-09 DIAGNOSIS — Z7901 Long term (current) use of anticoagulants: Secondary | ICD-10-CM | POA: Insufficient documentation

## 2022-02-09 DIAGNOSIS — I25118 Atherosclerotic heart disease of native coronary artery with other forms of angina pectoris: Secondary | ICD-10-CM | POA: Diagnosis not present

## 2022-02-09 DIAGNOSIS — R55 Syncope and collapse: Secondary | ICD-10-CM

## 2022-02-09 DIAGNOSIS — R42 Dizziness and giddiness: Secondary | ICD-10-CM | POA: Insufficient documentation

## 2022-02-09 DIAGNOSIS — I208 Other forms of angina pectoris: Secondary | ICD-10-CM | POA: Diagnosis not present

## 2022-02-09 LAB — CBC
HCT: 38.5 % (ref 36.0–46.0)
Hemoglobin: 13 g/dL (ref 12.0–15.0)
MCH: 30.3 pg (ref 26.0–34.0)
MCHC: 33.8 g/dL (ref 30.0–36.0)
MCV: 89.7 fL (ref 80.0–100.0)
Platelets: 302 10*3/uL (ref 150–400)
RBC: 4.29 MIL/uL (ref 3.87–5.11)
RDW: 12.9 % (ref 11.5–15.5)
WBC: 11 10*3/uL — ABNORMAL HIGH (ref 4.0–10.5)
nRBC: 0 % (ref 0.0–0.2)

## 2022-02-09 LAB — BASIC METABOLIC PANEL
Anion gap: 8 (ref 5–15)
BUN: 25 mg/dL — ABNORMAL HIGH (ref 8–23)
CO2: 23 mmol/L (ref 22–32)
Calcium: 9 mg/dL (ref 8.9–10.3)
Chloride: 103 mmol/L (ref 98–111)
Creatinine, Ser: 0.88 mg/dL (ref 0.44–1.00)
GFR, Estimated: >60 mL/min (ref >60)
Glucose, Bld: 87 mg/dL (ref 70–99)
Potassium: 4.1 mmol/L (ref 3.5–5.1)
Sodium: 134 mmol/L — ABNORMAL LOW (ref 135–145)

## 2022-02-09 LAB — URINALYSIS, ROUTINE W REFLEX MICROSCOPIC
Bilirubin Urine: NEGATIVE
Glucose, UA: NEGATIVE mg/dL
Ketones, ur: NEGATIVE mg/dL
Nitrite: NEGATIVE
Protein, ur: NEGATIVE mg/dL
Specific Gravity, Urine: 1.02 (ref 1.005–1.030)
pH: 6 (ref 5.0–8.0)

## 2022-02-09 LAB — URINALYSIS, MICROSCOPIC (REFLEX)

## 2022-02-09 LAB — TROPONIN I (HIGH SENSITIVITY)
Troponin I (High Sensitivity): 5 ng/L (ref <18)
Troponin I (High Sensitivity): 4 ng/L (ref <18)

## 2022-02-09 LAB — CBG MONITORING, ED: Glucose-Capillary: 98 mg/dL (ref 70–99)

## 2022-02-09 MED ORDER — SODIUM CHLORIDE 0.9 % IV SOLN: INTRAVENOUS | Status: DC

## 2022-02-09 NOTE — ED Triage Notes (Signed)
Patient states she feels lightheaded and feels like she is going to pass out x Wednesday.

## 2022-02-09 NOTE — ED Notes (Signed)
Lab notified of urine culture add on

## 2022-02-09 NOTE — ED Notes (Signed)
Ambulated to bathroom with steady gait, denies dizziness.

## 2022-02-09 NOTE — Discharge Instructions (Addendum)
Appointment follow-up with your primary care doctor and also make an appointment follow-up with cardiology.  Today's work-up without any significant abnormalities.  Cardiology may consider doing an echocardiogram and a nonexercise stress test to further evaluate your heart.  Return for any new or worse symptoms.

## 2022-02-09 NOTE — ED Provider Notes (Signed)
[facility] Provider Note   CSN: [id number] Arrival date & time: 02/09/22  1717     History  Chief Complaint  Patient presents with   Dizziness    Dawn Collins is a 76 y.o. female.  Patient on Wednesday had a lot of nausea felt like maybe she was going to pass out had some chest discomfort.  Had some continuation of chest discomfort yesterday.  No true room spinning.  No true dizziness.  Patient today has still felt as if she was going to pass out but no chest discomfort.  Patient's chest discomfort was kind of left anterior chest and then the other day it kind of went to the shoulder and up into the neck area.  None of the discomfort ever lasted more than 20 minutes.  Patient denies any visual changes any weakness to the arms or legs or any sensory changes.  No speech problems.       Home Medications Prior to Admission medications   Medication Sig Start Date End Date Taking? Authorizing Provider  acetaminophen (TYLENOL) 500 MG tablet Take 500-1,000 mg by mouth every 6 (six) hours as needed. For arthritis    [provider]  ALBUTEROL IN Inhale into the lungs.    [provider]  amiloride-hydrochlorothiazide (MODURETIC) 5-50 MG tablet Take 1 tablet by mouth daily.    [provider]  apixaban (ELIQUIS) 2.5 MG TABS tablet Take 1 tablet (2.5 mg total) by mouth every 12 (twelve) hours. Take Eliquis twice a day for two and a half more weeks, then discontinue the Eliquis. Once the patient has completed the blood thinner regimen, then take a Baby 81 mg Aspirin daily for three more weeks. 08/09/15   Perkins, Alexzandrew L, PA-C  aspirin EC 81 MG tablet Take 81 mg by mouth daily.    [provider]  cetirizine (ZYRTEC) 10 MG tablet Take 10 mg by mouth at bedtime as needed for allergies.    [provider]  diltiazem (CARDIZEM CD) 300 MG 24 hr capsule Take 300 mg by mouth daily.    [provider]   estradiol (ESTRACE) 2 MG tablet Take 2 mg by mouth daily.    [provider]  famotidine (PEPCID) 20 MG tablet Take 1 tablet (20 mg total) by mouth 2 (two) times daily. 09/06/20 10/06/20  Wyvonnia Dusky, MD  fluticasone (FLONASE) 50 MCG/ACT nasal spray SHAKE LIQUID AND USE 2 SPRAYS IN [facility] NOSTRIL DAILY 07/17/17   [provider]  fluticasone furoate-vilanterol (BREO ELLIPTA) 100-25 MCG/INH AEPB Inhale 1 puff by mouth into the lungs daily. 02/27/21   Blanchie Dessert, MD  gabapentin (NEURONTIN) 300 MG capsule Take 300 mg by mouth daily as needed. pain 06/28/15   [provider]  guaiFENesin (MUCINEX) 600 MG 12 hr tablet Mucinex 600 mg tablet, extended release 10/22/18   [provider]  hydrochlorothiazide (HYDRODIURIL) 12.5 MG tablet Take 12.5 mg by mouth daily.    [provider]  HYDROmorphone (DILAUDID) 2 MG tablet Take 1-2 tablets (2-4 mg total) by mouth every 4 (four) hours as needed for moderate pain or severe pain. 08/09/15   Perkins, Alexzandrew L, PA-C  latanoprost (XALATAN) 0.005 % ophthalmic solution Place 1 drop into both eyes at bedtime. 06/20/15   [provider]  losartan (COZAAR) 50 MG tablet Take 50 mg by mouth daily.    [provider]  montelukast (SINGULAIR) 10 MG tablet Take 10 mg by mouth at bedtime.  [provider]  pantoprazole (PROTONIX) 40 MG tablet Take 40 mg by mouth daily.    [provider]  promethazine-dextromethorphan (PROMETHAZINE-DM) 6.25-15 MG/5ML syrup Take 5 mLs by mouth 4 (four) times daily as needed for cough. 02/27/21   Blanchie Dessert, MD      Allergies    Morphine and related, Avelox [moxifloxacin hcl in nacl], Celebrex [celecoxib], Clarithromycin, Codeine, Food, Hycodan [hydrocodone bit-homatrop mbr], Pentazocine, Septra [bactrim], Sertraline hcl, Stadol [butorphanol tartrate], Duraphen [phenylephrine-guaifenesin], Floxin [ofloxacin], and Penicillins    Review of Systems    Review of Systems  Constitutional:  Negative for chills and fever.  HENT:  Negative for ear pain and sore throat.   Eyes:  Negative for pain and visual disturbance.  Respiratory:  Negative for cough and shortness of breath.   Cardiovascular:  Positive for chest pain. Negative for palpitations.  Gastrointestinal:  Positive for nausea. Negative for abdominal pain and vomiting.  Genitourinary:  Negative for dysuria and hematuria.  Musculoskeletal:  Negative for arthralgias and back pain.  Skin:  Negative for color change and rash.  Neurological:  Positive for light-headedness. Negative for seizures, syncope, facial asymmetry, speech difficulty, weakness, numbness and headaches.  All other systems reviewed and are negative.   Physical Exam Updated Vital Signs BP (!) 161/69 (BP Location: Left Arm)   Pulse 77   Temp 98.4 F (36.9 C) (Oral)   Resp 19   Ht 1.651 m (5\' 5" )   Wt 74.8 kg   SpO2 99%   BMI 27.46 kg/m  Physical Exam  ED Results / Procedures / Treatments   Labs (all labs ordered are listed, but only abnormal results are displayed) Labs Reviewed  BASIC METABOLIC PANEL  CBC  URINALYSIS, ROUTINE W REFLEX MICROSCOPIC  CBG MONITORING, ED    EKG None  Radiology No results found.  Procedures Procedures    Medications Ordered in ED Medications - No data to display  ED Course/ Medical Decision Making/ A&P                           Medical Decision Making Amount and/or Complexity of Data Reviewed Labs: ordered. Radiology: ordered.  Risk Prescription drug management.   Symptoms are somewhat concerning for possible atypical MI presentation.  Will check troponins.  Get basic labs we will give some IV fluids.  Does not sound like strokelike in nature.  Patient symptoms did start 2 days ago but is really no true dizziness or any room spinning.  Troponins x2 without any significant abnormalities.  Initial troponin was 5 repeat was 4.  Urinalysis had many  bacteria but did not have any significant white blood cells urine sent for culture do not think it is urinary tract infection.  Chest x-ray without any acute findings.  CBC had a white count of 11,000 hemoglobin was normal platelets were normal complete metabolic panel without any significant abnormalities.  Renal function normal potassium normal.  Sodium down a little bit at 134.  Glucose was 87.  Patient received IV fluids here.  Patient able to ambulate patient currently without any significant symptoms.  Patient stable for discharge and follow-up with her doctor.  Patient will return for any new or worse symptoms. Final Clinical Impression(s) / ED Diagnoses Final diagnoses:  None    Rx / DC Orders ED Discharge Orders     None         Fredia Sorrow, MD 02/09/22 2028

## 2022-02-10 ENCOUNTER — Encounter (HOSPITAL_COMMUNITY): Payer: Self-pay | Admitting: Internal Medicine

## 2022-02-10 ENCOUNTER — Other Ambulatory Visit: Payer: Self-pay

## 2022-02-10 ENCOUNTER — Inpatient Hospital Stay (HOSPITAL_COMMUNITY)
Admission: EM | Admit: 2022-02-10 | Discharge: 2022-02-12 | DRG: 303 | Disposition: A | Discharge status: Home / Self Care | Payer: Medicare Other | Attending: Family Medicine | Admitting: Family Medicine

## 2022-02-10 DIAGNOSIS — J4520 Mild intermittent asthma, uncomplicated: Secondary | ICD-10-CM | POA: Diagnosis not present

## 2022-02-10 DIAGNOSIS — Z91018 Allergy to other foods: Secondary | ICD-10-CM

## 2022-02-10 DIAGNOSIS — R079 Chest pain, unspecified: Secondary | ICD-10-CM | POA: Diagnosis present

## 2022-02-10 DIAGNOSIS — Z79899 Other long term (current) drug therapy: Secondary | ICD-10-CM

## 2022-02-10 DIAGNOSIS — K219 Gastro-esophageal reflux disease without esophagitis: Secondary | ICD-10-CM | POA: Diagnosis present

## 2022-02-10 DIAGNOSIS — E785 Hyperlipidemia, unspecified: Secondary | ICD-10-CM | POA: Diagnosis present

## 2022-02-10 DIAGNOSIS — I10 Essential (primary) hypertension: Secondary | ICD-10-CM | POA: Diagnosis not present

## 2022-02-10 DIAGNOSIS — Z885 Allergy status to narcotic agent status: Secondary | ICD-10-CM

## 2022-02-10 DIAGNOSIS — G8929 Other chronic pain: Secondary | ICD-10-CM | POA: Diagnosis present

## 2022-02-10 DIAGNOSIS — N390 Urinary tract infection, site not specified: Secondary | ICD-10-CM | POA: Diagnosis present

## 2022-02-10 DIAGNOSIS — Z7982 Long term (current) use of aspirin: Secondary | ICD-10-CM

## 2022-02-10 DIAGNOSIS — I208 Other forms of angina pectoris: Principal | ICD-10-CM

## 2022-02-10 DIAGNOSIS — Z7901 Long term (current) use of anticoagulants: Secondary | ICD-10-CM

## 2022-02-10 DIAGNOSIS — Z881 Allergy status to other antibiotic agents status: Secondary | ICD-10-CM

## 2022-02-10 DIAGNOSIS — M549 Dorsalgia, unspecified: Secondary | ICD-10-CM | POA: Diagnosis present

## 2022-02-10 DIAGNOSIS — Z79890 Hormone replacement therapy (postmenopausal): Secondary | ICD-10-CM

## 2022-02-10 DIAGNOSIS — Z90710 Acquired absence of both cervix and uterus: Secondary | ICD-10-CM

## 2022-02-10 DIAGNOSIS — Z888 Allergy status to other drugs, medicaments and biological substances status: Secondary | ICD-10-CM

## 2022-02-10 DIAGNOSIS — Z880 Allergy status to penicillin: Secondary | ICD-10-CM

## 2022-02-10 DIAGNOSIS — G4730 Sleep apnea, unspecified: Secondary | ICD-10-CM | POA: Diagnosis present

## 2022-02-10 DIAGNOSIS — I25118 Atherosclerotic heart disease of native coronary artery with other forms of angina pectoris: Principal | ICD-10-CM | POA: Diagnosis present

## 2022-02-10 DIAGNOSIS — R001 Bradycardia, unspecified: Secondary | ICD-10-CM | POA: Diagnosis not present

## 2022-02-10 DIAGNOSIS — Z96653 Presence of artificial knee joint, bilateral: Secondary | ICD-10-CM | POA: Diagnosis present

## 2022-02-10 DIAGNOSIS — J45909 Unspecified asthma, uncomplicated: Secondary | ICD-10-CM | POA: Diagnosis present

## 2022-02-10 DIAGNOSIS — E042 Nontoxic multinodular goiter: Secondary | ICD-10-CM | POA: Diagnosis present

## 2022-02-10 DIAGNOSIS — Z7951 Long term (current) use of inhaled steroids: Secondary | ICD-10-CM

## 2022-02-10 LAB — URINALYSIS, COMPLETE (UACMP) WITH MICROSCOPIC
Bilirubin Urine: NEGATIVE
Glucose, UA: NEGATIVE mg/dL
Hgb urine dipstick: NEGATIVE
Ketones, ur: NEGATIVE mg/dL
Nitrite: NEGATIVE
Protein, ur: NEGATIVE mg/dL
Specific Gravity, Urine: 1.006 (ref 1.005–1.030)
pH: 7 (ref 5.0–8.0)

## 2022-02-10 LAB — COMPREHENSIVE METABOLIC PANEL
ALT: 17 U/L (ref 0–44)
AST: 15 U/L (ref 15–41)
Albumin: 3.7 g/dL (ref 3.5–5.0)
Alkaline Phosphatase: 48 U/L (ref 38–126)
Anion gap: 8 (ref 5–15)
BUN: 14 mg/dL (ref 8–23)
CO2: 25 mmol/L (ref 22–32)
Calcium: 9.4 mg/dL (ref 8.9–10.3)
Chloride: 103 mmol/L (ref 98–111)
Creatinine, Ser: 0.91 mg/dL (ref 0.44–1.00)
GFR, Estimated: >60 mL/min (ref >60)
Glucose, Bld: 88 mg/dL (ref 70–99)
Potassium: 4.5 mmol/L (ref 3.5–5.1)
Sodium: 136 mmol/L (ref 135–145)
Total Bilirubin: 0.7 mg/dL (ref 0.3–1.2)
Total Protein: 7.1 g/dL (ref 6.5–8.1)

## 2022-02-10 LAB — CBC WITH DIFFERENTIAL/PLATELET
Abs Immature Granulocytes: 0.05 10*3/uL (ref 0.00–0.07)
Basophils Absolute: 0.1 10*3/uL (ref 0.0–0.1)
Basophils Relative: 1 %
Eosinophils Absolute: 0.2 10*3/uL (ref 0.0–0.5)
Eosinophils Relative: 2 %
HCT: 37.8 % (ref 36.0–46.0)
Hemoglobin: 12.9 g/dL (ref 12.0–15.0)
Immature Granulocytes: 1 %
Lymphocytes Relative: 20 %
Lymphs Abs: 2.2 10*3/uL (ref 0.7–4.0)
MCH: 31.1 pg (ref 26.0–34.0)
MCHC: 34.1 g/dL (ref 30.0–36.0)
MCV: 91.1 fL (ref 80.0–100.0)
Monocytes Absolute: 0.7 10*3/uL (ref 0.1–1.0)
Monocytes Relative: 6 %
Neutro Abs: 7.9 10*3/uL — ABNORMAL HIGH (ref 1.7–7.7)
Neutrophils Relative %: 70 %
Platelets: 285 10*3/uL (ref 150–400)
RBC: 4.15 MIL/uL (ref 3.87–5.11)
RDW: 12.8 % (ref 11.5–15.5)
WBC: 11.1 10*3/uL — ABNORMAL HIGH (ref 4.0–10.5)
nRBC: 0 % (ref 0.0–0.2)

## 2022-02-10 LAB — TROPONIN I (HIGH SENSITIVITY)
Troponin I (High Sensitivity): 7 ng/L (ref <18)
Troponin I (High Sensitivity): 6 ng/L (ref <18)

## 2022-02-10 MED ORDER — FENTANYL CITRATE PF 50 MCG/ML IJ SOSY: 12.5000 ug | PREFILLED_SYRINGE | INTRAMUSCULAR | Status: DC | PRN

## 2022-02-10 MED ORDER — ACETAMINOPHEN 325 MG PO TABS
650.0000 mg | ORAL_TABLET | Freq: Four times a day (QID) | ORAL | Status: DC | PRN
  Administered 2022-02-10 – 2022-02-11 (×2): 650 mg via ORAL
  Filled 2022-02-10 (×2): qty 2

## 2022-02-10 MED ORDER — PANTOPRAZOLE SODIUM 40 MG PO TBEC
40.0000 mg | DELAYED_RELEASE_TABLET | Freq: Every day | ORAL | Status: DC
  Administered 2022-02-11 – 2022-02-12 (×2): 40 mg via ORAL
  Filled 2022-02-10 (×2): qty 1

## 2022-02-10 MED ORDER — SODIUM CHLORIDE 0.9 % IV SOLN
INTRAVENOUS | Status: AC
Start: 2022-02-10 — End: 2022-02-11

## 2022-02-10 MED ORDER — SODIUM CHLORIDE 0.9 % IV SOLN
1.0000 g | INTRAVENOUS | Status: DC
  Administered 2022-02-10 – 2022-02-11 (×2): 1 g via INTRAVENOUS
  Filled 2022-02-10 (×2): qty 10

## 2022-02-10 MED ORDER — SODIUM CHLORIDE 0.9 % IV BOLUS
1000.0000 mL | Freq: Once | INTRAVENOUS | Status: AC
  Administered 2022-02-10: 1000 mL via INTRAVENOUS

## 2022-02-10 MED ORDER — LATANOPROST 0.005 % OP SOLN
1.0000 [drp] | Freq: Every day | OPHTHALMIC | Status: DC
  Administered 2022-02-11: 1 [drp] via OPHTHALMIC
  Filled 2022-02-10: qty 2.5

## 2022-02-10 MED ORDER — FLUTICASONE FUROATE-VILANTEROL 100-25 MCG/ACT IN AEPB
1.0000 | INHALATION_SPRAY | Freq: Every day | RESPIRATORY_TRACT | Status: DC
  Filled 2022-02-10: qty 28

## 2022-02-10 MED ORDER — SPIRONOLACTONE 25 MG PO TABS
25.0000 mg | ORAL_TABLET | Freq: Every day | ORAL | Status: DC
  Administered 2022-02-11 – 2022-02-12 (×2): 25 mg via ORAL
  Filled 2022-02-10 (×2): qty 1

## 2022-02-10 MED ORDER — ASPIRIN 81 MG PO TBEC
81.0000 mg | DELAYED_RELEASE_TABLET | Freq: Every day | ORAL | Status: DC
  Administered 2022-02-11 – 2022-02-12 (×2): 81 mg via ORAL
  Filled 2022-02-10 (×2): qty 1

## 2022-02-10 MED ORDER — ACETAMINOPHEN 650 MG RE SUPP: 650.0000 mg | Freq: Four times a day (QID) | RECTAL | Status: DC | PRN

## 2022-02-10 MED ORDER — MONTELUKAST SODIUM 10 MG PO TABS
10.0000 mg | ORAL_TABLET | Freq: Every day | ORAL | Status: DC
  Administered 2022-02-10 – 2022-02-11 (×2): 10 mg via ORAL
  Filled 2022-02-10 (×2): qty 1

## 2022-02-10 NOTE — ED Provider Notes (Signed)
[facility] EMERGENCY DEPARTMENT Provider Note   CSN: [id number] Arrival date & time: 02/10/22  1651     History  Chief Complaint  Patient presents with   Chest Pain    Dawn Collins is a 76 y.o. female.   Chest Pain  Patient is a 76 year old female with a history of HTN, HLD, asthma who presents to the emergency department for evaluation of chest pain.  According to the patient over the past 4 days she has been having intermittent left-sided chest pain which radiates to her left shoulder and up to the left side of her neck.  She describes her pain as a pulling.  She reports associated shortness of breath with her chest pain as well as nausea without emesis.  She has diaphoresis during these episodes.  Her pain does subside after a short period of rest.  Patient had an episode yesterday and was ultimately evaluated at [facility] emergency department.  Delta troponins at that time were negative, chest x-ray without acute findings patient had received IV fluids and was instructed to follow-up in the outpatient setting with her primary care provider.  Patient reports that today while carrying groceries back to her car she had another episode of similar shortness of breath, chest pain, diaphoresis and nausea.  Her symptoms lasted 15 to 20 minutes, consistent with prior episodes.  She subsequently called EMS who brought her here to the emergency department.  They gave her a full dose aspirin prior to arrival but no additional interventions.  Her vitals were stable in route.  Patient is currently on estrogen but denies any recent long travel, history of DVTs/PEs, or family history of clotting issues.  She denies a prior history of malignancy or recent surgeries.  Patient was evaluated in 2017 at [facility] in regards to her shortness of breath and she was evaluated for a PE at that time but her CT angio was negative.  Per old records patient had a exercise stress test  on 09/2018 reduced exercise tolerance due to fatigue, no angina, normal HR response, no ischemic STT changes, no echo wall motion abnormalities, Negative stress echo for ischemia.      Home Medications Prior to Admission medications   Medication Sig Start Date End Date Taking? Authorizing Provider  acetaminophen (TYLENOL) 500 MG tablet Take 500-1,000 mg by mouth every 6 (six) hours as needed for moderate pain. For arthritis   Yes [provider]  albuterol (VENTOLIN HFA) 108 (90 Base) MCG/ACT inhaler Inhale 2 puffs into the lungs every 6 (six) hours as needed for wheezing or shortness of breath.   Yes [provider]  aspirin EC 81 MG tablet Take 81 mg by mouth daily.   Yes [provider]  diltiazem (CARDIZEM CD) 360 MG 24 hr capsule Take 360 mg by mouth daily. 01/22/22  Yes [provider]  estradiol (ESTRACE) 2 MG tablet Take 2 mg by mouth daily.   Yes [provider]  ezetimibe (ZETIA) 10 MG tablet Take 10 mg by mouth every evening. 01/25/22  Yes [provider]  fluticasone (FLONASE) 50 MCG/ACT nasal spray Place 1-2 sprays into both nostrils daily as needed for allergies. 07/17/17  Yes [provider]  fluticasone furoate-vilanterol (BREO ELLIPTA) 100-25 MCG/INH AEPB Inhale 1 puff by mouth into the lungs daily. Patient taking differently: Inhale 1 puff into the lungs daily as needed (sob/wheezing). 02/27/21  Yes Plunkett, Alphonzo Lemmings, MD  guaiFENesin (MUCINEX) 600 MG 12 hr tablet  Take 600 mg by mouth 2 (two) times daily as needed for cough. 10/22/18  Yes [provider]  lactose free nutrition (BOOST) LIQD Take 237 mLs by mouth daily.   Yes [provider]  latanoprost (XALATAN) 0.005 % ophthalmic solution Place 1 drop into both eyes at bedtime. 06/20/15  Yes [provider]  meloxicam (MOBIC) 15 MG tablet Take 15 mg by mouth daily as needed for pain. 11/27/21  Yes [provider]  montelukast (SINGULAIR) 10  MG tablet Take 10 mg by mouth at bedtime.   Yes [provider]  pantoprazole (PROTONIX) 40 MG tablet Take 40 mg by mouth daily.   Yes [provider]  spironolactone (ALDACTONE) 25 MG tablet Take 25 mg by mouth daily. 01/18/22  Yes [provider]  telmisartan (MICARDIS) 80 MG tablet Take 80 mg by mouth daily. 01/29/22  Yes [provider]  apixaban (ELIQUIS) 2.5 MG TABS tablet Take 1 tablet (2.5 mg total) by mouth every 12 (twelve) hours. Take Eliquis twice a day for two and a half more weeks, then discontinue the Eliquis. Once the patient has completed the blood thinner regimen, then take a Baby 81 mg Aspirin daily for three more weeks. Patient not taking: Reported on 02/10/2022 08/09/15   Julien Girt, Alexzandrew L, PA-C  famotidine (PEPCID) 20 MG tablet Take 1 tablet (20 mg total) by mouth 2 (two) times daily. 09/06/20 10/06/20  Terald Sleeper, MD  HYDROmorphone (DILAUDID) 2 MG tablet Take 1-2 tablets (2-4 mg total) by mouth every 4 (four) hours as needed for moderate pain or severe pain. Patient not taking: Reported on 02/10/2022 08/09/15   Julien Girt, Alexzandrew L, PA-C      Allergies    Morphine and related, Avelox [moxifloxacin hcl in nacl], Celebrex [celecoxib], Clarithromycin, Codeine, Food, Hycodan [hydrocodone bit-homatrop mbr], Pentazocine, Septra [bactrim], Sertraline hcl, Stadol [butorphanol tartrate], Duraphen [phenylephrine-guaifenesin], Floxin [ofloxacin], and Penicillins    Review of Systems   Review of Systems  Cardiovascular:  Positive for chest pain.    Physical Exam Updated Vital Signs BP (!) 135/107   Pulse (!) 56   Temp 98 F (36.7 C)   Resp 12   SpO2 100%  Physical Exam Vitals and nursing note reviewed.  Constitutional:      General: She is not in acute distress.    Appearance: She is well-developed.  HENT:     Head: Normocephalic and atraumatic.  Eyes:     Extraocular Movements: Extraocular movements intact.      Conjunctiva/sclera: Conjunctivae normal.     Pupils: Pupils are equal, round, and reactive to light.  Neck:     Vascular: No JVD.  Cardiovascular:     Rate and Rhythm: Normal rate and regular rhythm.     Pulses:          Carotid pulses are 2+ on the right side and 2+ on the left side.      Radial pulses are 2+ on the right side and 2+ on the left side.       Dorsalis pedis pulses are 2+ on the right side and 2+ on the left side.       Posterior tibial pulses are 2+ on the right side and 2+ on the left side.     Heart sounds: Normal heart sounds. No murmur heard. Pulmonary:     Effort: Pulmonary effort is normal. No respiratory distress.     Breath sounds: Normal breath sounds. No stridor. No decreased breath sounds or  wheezing.  Abdominal:     Palpations: Abdomen is soft.     Tenderness: There is no abdominal tenderness.  Musculoskeletal:        General: No swelling.     Cervical back: Neck supple.     Right lower leg: No tenderness. No edema.     Left lower leg: No tenderness. No edema.  Skin:    General: Skin is warm and dry.     Capillary Refill: Capillary refill takes less than 2 seconds.     Findings: No rash.  Neurological:     General: No focal deficit present.     Mental Status: She is alert and oriented to person, place, and time.  Psychiatric:        Mood and Affect: Mood normal.     ED Results / Procedures / Treatments   Labs (all labs ordered are listed, but only abnormal results are displayed) Labs Reviewed  CBC WITH DIFFERENTIAL/PLATELET - Abnormal; Notable for the following components:      Result Value   WBC 11.1 (*)    Neutro Abs 7.9 (*)    All other components within normal limits  URINALYSIS, COMPLETE (UACMP) WITH MICROSCOPIC - Abnormal; Notable for the following components:   Color, Urine STRAW (*)    Leukocytes,Ua TRACE (*)    Bacteria, UA RARE (*)    All other components within normal limits  URINE CULTURE  COMPREHENSIVE METABOLIC PANEL  CK   PHOSPHORUS  MAGNESIUM  PROTIME-INR  TSH  COMPREHENSIVE METABOLIC PANEL  CBC  LIPID PANEL  TROPONIN I (HIGH SENSITIVITY)  TROPONIN I (HIGH SENSITIVITY)    EKG EKG Interpretation  Date/Time:  Saturday February 10 2022 16:54:29 EDT Ventricular Rate:  59 PR Interval:  166 QRS Duration: 108 QT Interval:  395 QTC Calculation: 392 R Axis:   11 Text Interpretation: Sinus rhythm RSR' in V1 or V2, right VCD or RVH No significant change since last tracing Confirmed by Richardean Canal ([id number]) on 02/10/2022 4:55:36 PM  Radiology DG Chest Port 1 View  Result Date: 02/09/2022 CLINICAL DATA:  Near syncope.  Lightheadedness. EXAM: PORTABLE CHEST 1 VIEW COMPARISON:  CT 10/24/2021 FINDINGS: The heart size and mediastinal contours are within normal limits. Both lungs are clear. The visualized skeletal structures are unremarkable. IMPRESSION: No active disease. Electronically Signed   By: Signa Kell M.D.   On: 02/09/2022 18:10    Procedures Procedures    Medications Ordered in ED Medications  montelukast (SINGULAIR) tablet 10 mg (10 mg Oral Given 02/10/22 2240)  pantoprazole (PROTONIX) EC tablet 40 mg (has no administration in time range)  spironolactone (ALDACTONE) tablet 25 mg (has no administration in time range)  latanoprost (XALATAN) 0.005 % ophthalmic solution 1 drop (has no administration in time range)  fluticasone furoate-vilanterol (BREO ELLIPTA) 100-25 MCG/INH 1 puff (has no administration in time range)  aspirin EC tablet 81 mg (has no administration in time range)  0.9 %  sodium chloride infusion ( Intravenous New Bag/Given 02/10/22 2241)  acetaminophen (TYLENOL) tablet 650 mg (650 mg Oral Given 02/10/22 2240)    Or  acetaminophen (TYLENOL) suppository 650 mg ( Rectal See Alternative 02/10/22 2240)  fentaNYL (SUBLIMAZE) injection 12.5-50 mcg (has no administration in time range)  cefTRIAXone (ROCEPHIN) 1 g in sodium chloride 0.9 % 100 mL IVPB (1 g Intravenous New Bag/Given 02/10/22  2247)  sodium chloride 0.9 % bolus 1,000 mL (0 mLs Intravenous Stopped 02/10/22 2219)    ED Course/ Medical Decision Making/ A&P  Medical Decision Making Problems Addressed: Stable angina [facility]): complicated acute illness or injury with systemic symptoms that poses a threat to life or bodily functions  Amount and/or Complexity of Data Reviewed Independent Historian: EMS External Data Reviewed: labs, radiology, ECG and notes. Labs: ordered. ECG/medicine tests: ordered.    Details: Rate 59, QTc 392.  Normal axis.  Normal intervals.  Possible Q waves in inferior and lateral leads, though these are consistent with the EKG performed yesterday.  No additional acute ST or T wave abnormalities to suggest underlying ACS.  Risk Decision regarding hospitalization.   Patient is a 76 year old female who presents emergency department as above.  On initial presentation patient's vital signs are stable and she is afebrile.  EMS had given her 1 dose of aspirin in route.  She is currently asymptomatic without chest pain, shortness of breath diaphoresis or nausea.  Her initial EKG transmitted by EMS did not show any acute ST or T wave abnormalities to suggest underlying ACS.  Her EKG performed here in the emergency department did not show any changes acutely from the EKG she had gotten yesterday at [facility].  Suspicion at this time is highest for stable angina.  In the setting of the patient's negative chest x-ray yesterday while she was seen at [facility] as well as her negative troponins low suspicion for NSTEMI or STEMI.  Low suspicion for underlying pneumonia in the absence of cough or fevers.  Patient is unable to be PERC secondary to her age and hormone use.  However her Wells score was negative.  Low suspicion clinically for PE and no clinical signs of DVT.  Baseline laboratory work and imaging studies will be ordered for this patient.  CMP was grossly unremarkable.  CBC with  leukocytosis of 11.1 and no evidence of anemia.  UA negative for signs of infection or blood.  Urine culture pending.  Delta troponin 7 and 6 respectively.  Initial hear score was 5 points making her high risk and appropriate for admission to the hospital for observation and stress test tomorrow.  Cardiology was consulted regarding this patient and they agreed to perform the stress test tomorrow and felt she would be appropriate for admission to the hospital for monitoring.  Inpatient hospitalist team was contacted for this patient and they agreed to admit her to their service.  Plan and findings discussed with patient at bedside she verbalized understanding was agreeable.  Please see inpatient provider notes for additional details regarding this patient's ongoing hospital course and management.         Final Clinical Impression(s) / ED Diagnoses Final diagnoses:  Stable angina [facility])    Rx / DC Orders ED Discharge Orders     None         Tommie Raymond, MD 02/10/22 2339    Charlynne Pander, MD 02/11/22 1459

## 2022-02-10 NOTE — Assessment & Plan Note (Addendum)
Repeat UA sent for urine culture  Pt symptomatic Start on rocephin, pen allergies rash only

## 2022-02-10 NOTE — Assessment & Plan Note (Signed)
Continue spironolactone 25 mg p.o. daily For tonight hold diltiazem given bradycardia may need to restart at a lower dose

## 2022-02-10 NOTE — H&P (Signed)
Dawn Collins VQM:[id number] DOB: 08-02-1945 DOA: 02/10/2022     PCP: Jonathon Bellows, PA-C   Outpatient Specialists:     Pulmonary  Dr.Blaylock, Oneal Grout, PA-C    Urology Dr. Landry Mellow, Hazle Coca, MD [facility]  Patient arrived to ER on 02/10/22 at 1651 Referred by Attending Drenda Freeze, MD   Patient coming from:    home Lives alone,        Chief Complaint:   Chief Complaint  Patient presents with   Chest Pain    HPI: Dawn Collins is a 76 y.o. female with medical history significant of Asthma, HTN, HLD, goiter    Presented with   chest pain  Started to have Cp while carrying groceries hx of HTN HLD  Have been having intermittent CP  Associated with Nausea diaphoresis on the left radiated to neck and shoulder  Had ER visit yesterday  Trop neg ecg was non ischemic Reports frequent urination  Reports chills  No dysuria Reports heavy chest radiating ot left arm and neck with exertion She was at work and started to sweat felt lightheaded  Nausea She was working on hair at the time she thought it was because she did not eat well  She was feeling chilly  She ate took tylenols ate and went to bed Next day she was still not feeling well She has chronic back pain   She has taken her diltiazem today  Does not smoke or drink        Regarding pertinent Chronic problems:     Hyperlipidemia - on statins zetia     HTN on micardis, spironolactone,        Asthma -well   controlled on home inhalers/          While in ER:   EKG unchanged troponin stable     CXR -  NON acute    Following Medications were ordered in ER: Medications  sodium chloride 0.9 % bolus 1,000 mL (1,000 mLs Intravenous New Bag/Given 02/10/22 1927)    _______________________________________________________ ER Provider Called: Cardiology   Dr.Khan They Recommend admit to medicine   Will see in AM    ED Triage Vitals  Enc Vitals Group     BP 02/10/22  1651 (!) 135/54     Pulse Rate 02/10/22 1651 63     Resp 02/10/22 1651 19     Temp 02/10/22 1651 98.4 F (36.9 C)     Temp Source 02/10/22 1651 Oral     SpO2 02/10/22 1651 100 %     Weight --      Height --      Head Circumference --      Peak Flow --      Pain Score 02/10/22 1702 0     Pain Loc --      Pain Edu? --      Excl. in [place]? --   TMAX(24)@     _________________________________________ Significant initial  Findings: Abnormal Labs Reviewed  CBC WITH DIFFERENTIAL/PLATELET - Abnormal; Notable for the following components:      Result Value   WBC 11.1 (*)    Neutro Abs 7.9 (*)    All other components within normal limits     _________________________ Troponin  6 -5 ECG: Ordered Personally reviewed and interpreted by me showing: HR : 59 Rhythm: Sinus rhythm RSR' in V1 or V2, right VCD or RVH No significant change since last tracing QTC 392     The  recent clinical data is shown below. Vitals:   02/10/22 1915 02/10/22 1945 02/10/22 2000 02/10/22 2100  BP: (!) 144/66 (!) 153/59 136/61 (!) 135/107  Pulse: (!) 51 (!) 58 (!) 50 (!) 56  Resp: 19 18 17 12   Temp:    98 F (36.7 C)  TempSrc:      SpO2: 99% 100% 100% 100%      WBC     Component Value Date/Time   WBC 11.1 (H) 02/10/2022 1919   LYMPHSABS 2.2 02/10/2022 1919   MONOABS 0.7 02/10/2022 1919   EOSABS 0.2 02/10/2022 1919   BASOSABS 0.1 02/10/2022 1919      UA yestrday many bacteria   Urine analysis:    Component Value Date/Time   COLORURINE YELLOW 02/09/2022 1750   APPEARANCEUR HAZY (A) 02/09/2022 1750   LABSPEC 1.020 02/09/2022 1750   PHURINE 6.0 02/09/2022 1750   GLUCOSEU NEGATIVE 02/09/2022 1750   HGBUR TRACE (A) 02/09/2022 1750   BILIRUBINUR NEGATIVE 02/09/2022 1750   KETONESUR NEGATIVE 02/09/2022 1750   PROTEINUR NEGATIVE 02/09/2022 1750   UROBILINOGEN 0.2 05/28/2011 1130   NITRITE NEGATIVE 02/09/2022 1750   LEUKOCYTESUR SMALL (A) 02/09/2022 1750    Results for orders placed or  performed during the hospital encounter of 08/02/15  Surgical pcr screen     Status: None   Collection Time: 08/02/15  3:37 PM   Specimen: Nasal Mucosa; Nasal Swab  Result Value Ref Range Status   MRSA, PCR NEGATIVE NEGATIVE Final   Staphylococcus aureus NEGATIVE NEGATIVE Final    Comment:        The Xpert SA Assay (FDA approved for NASAL specimens in patients over 59 years of age), is one component of a comprehensive surveillance program.  Test performance has been validated by [facility] for patients greater than or equal to 62 year old. It is not intended to diagnose infection nor to guide or monitor treatment.      _______________________________________________ Hospitalist was called for admission for chest pain    The following Work up has been ordered so far:  Orders Placed This Encounter  Procedures   CBC with Differential   Comprehensive metabolic panel   Diet Heart Room service appropriate? Yes; Fluid consistency: Thin   Consult to hospitalist   EKG 12-Lead   EKG 12-Lead   Saline lock IV     OTHER Significant initial  Findings:  labs showing:    Recent Labs  Lab 02/09/22 1755 02/10/22 1919  NA 134* 136  K 4.1 4.5  CO2 23 25  GLUCOSE 87 88  BUN 25* 14  CREATININE 0.88 0.91  CALCIUM 9.0 9.4    Cr  stable,    Lab Results  Component Value Date   CREATININE 0.91 02/10/2022   CREATININE 0.88 02/09/2022   CREATININE 0.71 02/27/2021    Recent Labs  Lab 02/10/22 1919  AST 15  ALT 17  ALKPHOS 48  BILITOT 0.7  PROT 7.1  ALBUMIN 3.7   Lab Results  Component Value Date   CALCIUM 9.4 02/10/2022       Plt: Lab Results  Component Value Date   PLT 285 02/10/2022        Recent Labs  Lab 02/09/22 1755 02/10/22 1919  WBC 11.0* 11.1*  NEUTROABS  --  7.9*  HGB 13.0 12.9  HCT 38.5 37.8  MCV 89.7 91.1  PLT 302 285    HG/HCT  stable,      Component Value Date/Time   HGB 12.9 02/10/2022  1919   HCT 37.8 02/10/2022 1919   MCV  91.1 02/10/2022 1919     Cardiac Panel (last 3 results) No results for input(s): "CKTOTAL", "CKMB", "TROPONINI", "RELINDX" in the last 72 hours.  .car BNP (last 3 results) Recent Labs    02/27/21 1136  BNP 331.5*      DM  labs:  HbA1C: No results for input(s): "HGBA1C" in the last 8760 hours.     CBG (last 3)  Recent Labs    02/09/22 1812  GLUCAP 98          Cultures: No results found for: "SDES", "SPECREQUEST", "CULT", "REPTSTATUS"   Radiological Exams on Admission: DG Chest Port 1 View  Result Date: 02/09/2022 CLINICAL DATA:  Near syncope.  Lightheadedness. EXAM: PORTABLE CHEST 1 VIEW COMPARISON:  CT 10/24/2021 FINDINGS: The heart size and mediastinal contours are within normal limits. Both lungs are clear. The visualized skeletal structures are unremarkable. IMPRESSION: No active disease. Electronically Signed   By: Kerby Moors M.D.   On: 02/09/2022 18:10   _______________________________________________________________________________________________________ Latest   Blood pressure (!) 135/107, pulse (!) 56, temperature 98 F (36.7 C), resp. rate 12, SpO2 100 %.   Vitals  labs and radiology finding personally reviewed  Review of Systems:    Pertinent positives include:  chest pain,   Constitutional:  No weight loss, night sweats, Fevers, chills, fatigue, weight loss  HEENT:  No headaches, Difficulty swallowing,Tooth/dental problems,Sore throat,  No sneezing, itching, ear ache, nasal congestion, post nasal drip,  Cardio-vascular:  No Orthopnea, PND, anasarca, dizziness, palpitations.no Bilateral lower extremity swelling  GI:  No heartburn, indigestion, abdominal pain, nausea, vomiting, diarrhea, change in bowel habits, loss of appetite, melena, blood in stool, hematemesis Resp:  no shortness of breath at rest. No dyspnea on exertion, No excess mucus, no productive cough, No non-productive cough, No coughing up of blood.No change in color of mucus.No  wheezing. Skin:  no rash or lesions. No jaundice GU:  no dysuria, change in color of urine, no urgency or frequency. No straining to urinate.  No flank pain.  Musculoskeletal:  No joint pain or no joint swelling. No decreased range of motion. No back pain.  Psych:  No change in mood or affect. No depression or anxiety. No memory loss.  Neuro: no localizing neurological complaints, no tingling, no weakness, no double vision, no gait abnormality, no slurred speech, no confusion  All systems reviewed and apart from Adeline all are negative _______________________________________________________________________________________________ Past Medical History:   Past Medical History:  Diagnosis Date   Arthritis    knees, back.   Asthma    2 weeks cold,no problems now-well controlled   Bursitis, knee    Bil. Hips, not knee   Complication of anesthesia    slow to wake up   Diverticulitis of colon 05-28-11   no current problems   GERD (gastroesophageal reflux disease)    tx. pantoprazole   Hypertension    Multiple thyroid nodules    Pneumonia    PONV (postoperative nausea and vomiting)    Sleep apnea    No cpap now -3 months last due to insurance reasons      Past Surgical History:  Procedure Laterality Date   ABDOMINAL HYSTERECTOMY     partial hyst   BREAST BIOPSY     several- all benign   CARDIAC CATHETERIZATION     10'11   [place]   FOOT SURGERY     Bilateral  KNEE ARTHROSCOPY     right '04   TOTAL KNEE ARTHROPLASTY  06/04/2011   Procedure: TOTAL KNEE ARTHROPLASTY;  Surgeon: Gearlean Alf;  Location: [facility];  Service: Orthopedics;  Laterality: Right;   TOTAL KNEE ARTHROPLASTY Left 08/08/2015   Procedure: TOTAL LEFT KNEE ARTHROPLASTY;  Surgeon: Gaynelle Arabian, MD;  Location: [facility];  Service: Orthopedics;  Laterality: Left;    Social History:  Ambulatory   independently      reports that she has never smoked. She has never used smokeless  tobacco. She reports that she does not drink alcohol and does not use drugs.     Family History:   History reviewed. No pertinent family history. ______________________________________________________________________________________________ Allergies: Allergies  Allergen Reactions   Morphine And Related Nausea And Vomiting   Avelox [Moxifloxacin Hcl In Nacl] Other (See Comments)    Numbness    Celebrex [Celecoxib] Other (See Comments)    Chest pain    Clarithromycin Nausea And Vomiting   Codeine Other (See Comments)    Sweating,vomiting and shortness of breath   Food Nausea And Vomiting    Seafood (all)   Hycodan [Hydrocodone Bit-Homatrop Mbr] Other (See Comments)    dyspnea   Pentazocine Other (See Comments)    Chest pain   Septra [Bactrim] Nausea And Vomiting   Sertraline Hcl Other (See Comments)    tinnitus   Stadol [Butorphanol Tartrate] Nausea And Vomiting   Duraphen [Phenylephrine-Guaifenesin] Palpitations   Floxin [Ofloxacin] Rash   Penicillins Rash    Has patient had a PCN reaction causing immediate rash, facial/tongue/throat swelling, SOB or lightheadedness with hypotension: Yes Has patient had a PCN reaction causing severe rash involving mucus membranes or skin necrosis: Yes Has patient had a PCN reaction that required hospitalization No Has patient had a PCN reaction occurring within the last 10 years: No If all of the above answers are "NO", then may proceed with Cephalosporin use.      Prior to Admission medications   Medication Sig Start Date End Date Taking? Authorizing Provider  acetaminophen (TYLENOL) 500 MG tablet Take 500-1,000 mg by mouth every 6 (six) hours as needed for moderate pain. For arthritis   Yes [provider]  albuterol (VENTOLIN HFA) 108 (90 Base) MCG/ACT inhaler Inhale 2 puffs into the lungs every 6 (six) hours as needed for wheezing or shortness of breath.   Yes [provider]  aspirin EC 81 MG tablet Take 81 mg by  mouth daily.   Yes [provider]  diltiazem (CARDIZEM CD) 360 MG 24 hr capsule Take 360 mg by mouth daily. 01/22/22  Yes [provider]  estradiol (ESTRACE) 2 MG tablet Take 2 mg by mouth daily.   Yes [provider]  ezetimibe (ZETIA) 10 MG tablet Take 10 mg by mouth every evening. 01/25/22  Yes [provider]  fluticasone (FLONASE) 50 MCG/ACT nasal spray Place 1-2 sprays into both nostrils daily as needed for allergies. 07/17/17  Yes [provider]  fluticasone furoate-vilanterol (BREO ELLIPTA) 100-25 MCG/INH AEPB Inhale 1 puff by mouth into the lungs daily. Patient taking differently: Inhale 1 puff into the lungs daily as needed (sob/wheezing). 02/27/21  Yes Blanchie Dessert, MD  guaiFENesin (MUCINEX) 600 MG 12 hr tablet Take 600 mg by mouth 2 (two) times daily as needed for cough. 10/22/18  Yes [provider]  lactose free nutrition (BOOST) LIQD Take 237 mLs by mouth daily.   Yes [provider]  latanoprost (XALATAN) 0.005 % ophthalmic solution  Place 1 drop into both eyes at bedtime. 06/20/15  Yes [provider]  meloxicam (MOBIC) 15 MG tablet Take 15 mg by mouth daily as needed for pain. 11/27/21  Yes [provider]  montelukast (SINGULAIR) 10 MG tablet Take 10 mg by mouth at bedtime.   Yes [provider]  pantoprazole (PROTONIX) 40 MG tablet Take 40 mg by mouth daily.   Yes [provider]  spironolactone (ALDACTONE) 25 MG tablet Take 25 mg by mouth daily. 01/18/22  Yes [provider]  telmisartan (MICARDIS) 80 MG tablet Take 80 mg by mouth daily. 01/29/22  Yes [provider]  apixaban (ELIQUIS) 2.5 MG TABS tablet Take 1 tablet (2.5 mg total) by mouth every 12 (twelve) hours. Take Eliquis twice a day for two and a half more weeks, then discontinue the Eliquis. Once the patient has completed the blood thinner regimen, then take a Baby 81 mg Aspirin daily for three more  weeks. Patient not taking: Reported on 02/10/2022 08/09/15   Dara Lords, Alexzandrew L, PA-C  famotidine (PEPCID) 20 MG tablet Take 1 tablet (20 mg total) by mouth 2 (two) times daily. 09/06/20 10/06/20  Wyvonnia Dusky, MD  HYDROmorphone (DILAUDID) 2 MG tablet Take 1-2 tablets (2-4 mg total) by mouth every 4 (four) hours as needed for moderate pain or severe pain. Patient not taking: Reported on 02/10/2022 08/09/15   Joelene Millin, PA-C    ___________________________________________________________________________________________________ Physical Exam:    02/10/2022    9:00 PM 02/10/2022    8:00 PM 02/10/2022    7:45 PM  Vitals with BMI  Systolic 086 761 950  Diastolic 932 61 59  Pulse 56 50 58     1. General:  in No  Acute distress   Chronically ill   -appearing 2. Psychological: Alert and   Oriented 3. Head/ENT:    Dry Mucous Membranes                          Head Non traumatic, neck supple                          Poor Dentition 4. SKIN: decreased Skin turgor,  Skin clean Dry and intact no rash 5. Heart: Regular rate and rhythm no  Murmur, no Rub or gallop 6. Lungs:  Clear to auscultation bilaterally, no wheezes or crackles   7. Abdomen: Soft,  non-tender, Non distended  bowel sounds present 8. Lower extremities: no clubbing, cyanosis, no  edema 9. Neurologically Grossly intact, moving all 4 extremities equally   10. MSK: Normal range of motion    Chart has been reviewed  ______________________________________________________________________________________________  Assessment/Plan 76 y.o. female with medical history significant of Asthma, HTN, HLD, goiter   Admitted for CP evaluation    Present on Admission:  Chest pain  Asthma, chronic  Hyperlipidemia  Essential hypertension  UTI (urinary tract infection)     Asthma, chronic Chronic continue albuterol as needed  Hyperlipidemia On Zetia 10 mg po q day will continue  Chest pain - H=   2  ,E=   1 ,A= 2  , R   2   , T 0   for the  Total of 7 therefore will admit for observation and further evaluation ( Risk of MACE: Scores 0-3  of 0.9-1.7%.,  4-6: 12-16.6% , Scores ?7: 50-65% )   - troponin unremarkable  -No acute ischemic changes on ECG      -  monitor on telemetry, cycle cardiac enzymes, obtain serial ECG and  ECHO in AM.   - Daily aspirin -  Further risk stratify with lipid panel, hgA1C, obtain TSH.  Make sure patient is on Aspirin.  We will notify cardiology regarding patient's admission. Further management depends on pending  Workup N.p.o. postmidnight plan for stress test in a.m. cardiology will see in consult   Essential hypertension Continue spironolactone 25 mg p.o. daily For tonight hold diltiazem given bradycardia may need to restart at a lower dose  UTI (urinary tract infection) Repeat UA sent for urine culture  Pt symptomatic Start on rocephin, pen allergies rash only    Other plan as per orders.  DVT prophylaxis:  SCD       Code Status:    Code Status: Prior FULL CODE    as per patient  family  I had personally discussed CODE STATUS with patient     Family Communication:   Family not at  Bedside    Disposition Plan:           To home once workup is complete and patient is stable   Following barriers for discharge:                                                          Pain controlled with PO medications                              white count improving able to transition to PO antibiotics                             Will need to be able to tolerate PO                              Will need consultants to evaluate patient prior to discharge     Consults called: Cardiology consulted will see in AM  Admission status:  ED Disposition     ED Disposition  [employer]: [facility] [[id number]]  Level of Care: Telemetry Cardiac [[id number]]  May place patient in observation at [facility] or [place] if  equivalent level of care is available:: No  Covid Evaluation: Asymptomatic - no recent exposure (last 10 days) testing not required  Diagnosis: Chest pain [[id number]]  Admitting Physician: Toy Baker [[id number]]  Attending Physician: Toy Baker [[id number]]           Obs    Level of care     tele    indefinitely please discontinue once patient no longer qualifies COVID-19 Labs     Katyana Trolinger 02/10/2022, 11:28 PM    [facility]     after 2 AM please page floor coverage PA If 7AM-7PM, please contact the day team taking care of the patient using [email or web address]   Patient was evaluated in the context of the global COVID-19 pandemic, which necessitated consideration that the patient might be at risk for infection with the SARS-CoV-2 virus that causes COVID-19. Institutional protocols and algorithms that pertain to the evaluation of patients at risk for COVID-19 are in a  state of rapid change based on information released by regulatory bodies including the CDC and federal and state organizations. These policies and algorithms were followed during the patient's care.

## 2022-02-10 NOTE — Subjective & Objective (Signed)
Started to have Cp while carrying groceries hx of HTN HLD  Have been having intermittent CP  Associated with Nausea diaphoresis on the left radiated to neck and shoulder  Had ER visit yesterday  Trop neg ecg was non ischemic

## 2022-02-10 NOTE — Assessment & Plan Note (Signed)
On Zetia 10 mg po q day will continue

## 2022-02-10 NOTE — ED Notes (Signed)
Pt ambulated to the bathroom.  

## 2022-02-10 NOTE — ED Triage Notes (Signed)
Pt was carrying groceries and all of sudden started feeling really hot, diaphoretic, nauseated and SHOB. PT has been experiencing intermittent left sided chest pain that radiates to left shoulder since Wednesday. Chest pain resolved 15-20 mins with rest. 324 aspirin PTA.

## 2022-02-10 NOTE — Assessment & Plan Note (Addendum)
-   H=   2  ,E=   1 ,A= 2 , R   2   , T 0   for the  Total of 7 therefore will admit for observation and further evaluation ( Risk of MACE: Scores 0-3  of 0.9-1.7%.,  4-6: 12-16.6% , Scores ?7: 50-65% )   - troponin unremarkable  -No acute ischemic changes on ECG      - monitor on telemetry, cycle cardiac enzymes, obtain serial ECG and  ECHO in AM.   - Daily aspirin -  Further risk stratify with lipid panel, hgA1C, obtain TSH.  Make sure patient is on Aspirin.  We will notify cardiology regarding patient's admission. Further management depends on pending  Workup N.p.o. postmidnight plan for stress test in a.m. cardiology will see in consult

## 2022-02-10 NOTE — Assessment & Plan Note (Signed)
Chronic continue albuterol as needed

## 2022-02-11 ENCOUNTER — Observation Stay (HOSPITAL_COMMUNITY): Payer: Medicare Other

## 2022-02-11 DIAGNOSIS — I10 Essential (primary) hypertension: Secondary | ICD-10-CM | POA: Diagnosis present

## 2022-02-11 DIAGNOSIS — E042 Nontoxic multinodular goiter: Secondary | ICD-10-CM | POA: Diagnosis present

## 2022-02-11 DIAGNOSIS — Z885 Allergy status to narcotic agent status: Secondary | ICD-10-CM | POA: Diagnosis not present

## 2022-02-11 DIAGNOSIS — Z7901 Long term (current) use of anticoagulants: Secondary | ICD-10-CM | POA: Diagnosis not present

## 2022-02-11 DIAGNOSIS — R001 Bradycardia, unspecified: Secondary | ICD-10-CM | POA: Diagnosis not present

## 2022-02-11 DIAGNOSIS — N390 Urinary tract infection, site not specified: Secondary | ICD-10-CM | POA: Diagnosis present

## 2022-02-11 DIAGNOSIS — Z7951 Long term (current) use of inhaled steroids: Secondary | ICD-10-CM | POA: Diagnosis not present

## 2022-02-11 DIAGNOSIS — Z7982 Long term (current) use of aspirin: Secondary | ICD-10-CM | POA: Diagnosis not present

## 2022-02-11 DIAGNOSIS — Z96653 Presence of artificial knee joint, bilateral: Secondary | ICD-10-CM | POA: Diagnosis present

## 2022-02-11 DIAGNOSIS — Z881 Allergy status to other antibiotic agents status: Secondary | ICD-10-CM | POA: Diagnosis not present

## 2022-02-11 DIAGNOSIS — Z91018 Allergy to other foods: Secondary | ICD-10-CM | POA: Diagnosis not present

## 2022-02-11 DIAGNOSIS — E785 Hyperlipidemia, unspecified: Secondary | ICD-10-CM | POA: Diagnosis present

## 2022-02-11 DIAGNOSIS — J4520 Mild intermittent asthma, uncomplicated: Secondary | ICD-10-CM | POA: Diagnosis not present

## 2022-02-11 DIAGNOSIS — K219 Gastro-esophageal reflux disease without esophagitis: Secondary | ICD-10-CM | POA: Diagnosis present

## 2022-02-11 DIAGNOSIS — Z79899 Other long term (current) drug therapy: Secondary | ICD-10-CM | POA: Diagnosis not present

## 2022-02-11 DIAGNOSIS — Z880 Allergy status to penicillin: Secondary | ICD-10-CM | POA: Diagnosis not present

## 2022-02-11 DIAGNOSIS — R079 Chest pain, unspecified: Secondary | ICD-10-CM

## 2022-02-11 DIAGNOSIS — Z888 Allergy status to other drugs, medicaments and biological substances status: Secondary | ICD-10-CM | POA: Diagnosis not present

## 2022-02-11 DIAGNOSIS — Z79890 Hormone replacement therapy (postmenopausal): Secondary | ICD-10-CM | POA: Diagnosis not present

## 2022-02-11 DIAGNOSIS — M549 Dorsalgia, unspecified: Secondary | ICD-10-CM | POA: Diagnosis present

## 2022-02-11 DIAGNOSIS — R931 Abnormal findings on diagnostic imaging of heart and coronary circulation: Secondary | ICD-10-CM | POA: Diagnosis not present

## 2022-02-11 DIAGNOSIS — I2510 Atherosclerotic heart disease of native coronary artery without angina pectoris: Secondary | ICD-10-CM | POA: Diagnosis not present

## 2022-02-11 DIAGNOSIS — G8929 Other chronic pain: Secondary | ICD-10-CM | POA: Diagnosis present

## 2022-02-11 DIAGNOSIS — I25118 Atherosclerotic heart disease of native coronary artery with other forms of angina pectoris: Secondary | ICD-10-CM | POA: Diagnosis present

## 2022-02-11 DIAGNOSIS — G4730 Sleep apnea, unspecified: Secondary | ICD-10-CM | POA: Diagnosis present

## 2022-02-11 DIAGNOSIS — Z90710 Acquired absence of both cervix and uterus: Secondary | ICD-10-CM | POA: Diagnosis not present

## 2022-02-11 DIAGNOSIS — I208 Other forms of angina pectoris: Secondary | ICD-10-CM | POA: Diagnosis present

## 2022-02-11 LAB — PHOSPHORUS: Phosphorus: 3.2 mg/dL (ref 2.5–4.6)

## 2022-02-11 LAB — CBC
HCT: 34.8 % — ABNORMAL LOW (ref 36.0–46.0)
Hemoglobin: 11.9 g/dL — ABNORMAL LOW (ref 12.0–15.0)
MCH: 31.1 pg (ref 26.0–34.0)
MCHC: 34.2 g/dL (ref 30.0–36.0)
MCV: 90.9 fL (ref 80.0–100.0)
Platelets: 252 10*3/uL (ref 150–400)
RBC: 3.83 MIL/uL — ABNORMAL LOW (ref 3.87–5.11)
RDW: 12.7 % (ref 11.5–15.5)
WBC: 7.9 10*3/uL (ref 4.0–10.5)
nRBC: 0 % (ref 0.0–0.2)

## 2022-02-11 LAB — ECHOCARDIOGRAM COMPLETE
AR max vel: 2.43 cm2
AV Peak grad: 10.1 mmHg
Ao pk vel: 1.59 m/s
Area-P 1/2: 2.91 cm2
Calc EF: 58.7 %
Height: 65 in
P 1/2 time: 499 msec
S' Lateral: 2.8 cm
Single Plane A2C EF: 58.2 %
Single Plane A4C EF: 57.6 %
Weight: 2638.47 oz

## 2022-02-11 LAB — COMPREHENSIVE METABOLIC PANEL
ALT: 14 U/L (ref 0–44)
AST: 14 U/L — ABNORMAL LOW (ref 15–41)
Albumin: 3.2 g/dL — ABNORMAL LOW (ref 3.5–5.0)
Alkaline Phosphatase: 42 U/L (ref 38–126)
Anion gap: 8 (ref 5–15)
BUN: 11 mg/dL (ref 8–23)
CO2: 24 mmol/L (ref 22–32)
Calcium: 8.7 mg/dL — ABNORMAL LOW (ref 8.9–10.3)
Chloride: 106 mmol/L (ref 98–111)
Creatinine, Ser: 0.76 mg/dL (ref 0.44–1.00)
GFR, Estimated: >60 mL/min (ref >60)
Glucose, Bld: 92 mg/dL (ref 70–99)
Potassium: 4 mmol/L (ref 3.5–5.1)
Sodium: 138 mmol/L (ref 135–145)
Total Bilirubin: 0.6 mg/dL (ref 0.3–1.2)
Total Protein: 6 g/dL — ABNORMAL LOW (ref 6.5–8.1)

## 2022-02-11 LAB — PROTIME-INR
INR: 1.1 (ref 0.8–1.2)
Prothrombin Time: 13.6 seconds (ref 11.4–15.2)

## 2022-02-11 LAB — URINE CULTURE: Culture: <10000 — AB

## 2022-02-11 LAB — LIPID PANEL
Cholesterol: 161 mg/dL (ref 0–200)
HDL: 47 mg/dL (ref >40)
LDL Cholesterol: 104 mg/dL — ABNORMAL HIGH (ref 0–99)
Total CHOL/HDL Ratio: 3.4 RATIO
Triglycerides: 50 mg/dL (ref <150)
VLDL: 10 mg/dL (ref 0–40)

## 2022-02-11 LAB — MRSA NEXT GEN BY PCR, NASAL: MRSA by PCR Next Gen: NOT DETECTED

## 2022-02-11 LAB — TSH: TSH: 0.94 u[IU]/mL (ref 0.350–4.500)

## 2022-02-11 LAB — MAGNESIUM: Magnesium: 1.8 mg/dL (ref 1.7–2.4)

## 2022-02-11 LAB — CK: Total CK: 44 U/L (ref 38–234)

## 2022-02-11 MED ORDER — FLUTICASONE PROPIONATE 50 MCG/ACT NA SUSP: 1.0000 | Freq: Every day | NASAL | Status: DC | PRN

## 2022-02-11 MED ORDER — EZETIMIBE 10 MG PO TABS
10.0000 mg | ORAL_TABLET | Freq: Every evening | ORAL | Status: DC
  Administered 2022-02-11 – 2022-02-12 (×2): 10 mg via ORAL
  Filled 2022-02-11 (×2): qty 1

## 2022-02-11 MED ORDER — IRBESARTAN 150 MG PO TABS
300.0000 mg | ORAL_TABLET | Freq: Every day | ORAL | Status: DC
  Administered 2022-02-11 – 2022-02-12 (×2): 300 mg via ORAL
  Filled 2022-02-11 (×2): qty 2

## 2022-02-11 MED ORDER — ALBUTEROL SULFATE (2.5 MG/3ML) 0.083% IN NEBU: 2.5000 mg | INHALATION_SOLUTION | Freq: Four times a day (QID) | RESPIRATORY_TRACT | Status: DC | PRN

## 2022-02-11 NOTE — Care Management Obs Status (Signed)
[place] NOTIFICATION   Patient Details  Name: Dawn Collins MRN: [id number] Date of Birth: 09/11/1945   Medicare Observation Status Notification Given:  Yes    Verdell Carmine, RN 02/11/2022, 10:46 AM

## 2022-02-11 NOTE — ED Notes (Signed)
Pt ambulated to restroom with steady gait.

## 2022-02-11 NOTE — Progress Notes (Signed)
PROGRESS NOTE    Dawn Collins  MHD:[id number] DOB: 1945/09/25 DOA: 02/10/2022 PCP: Jonathon Bellows, PA-C   Brief Narrative:  Dawn Collins is a 76 y.o. female with medical history significant of Asthma, HTN, HLD, goiter presented with chest pain.  Upon arrival to ED: Patient pulse: 63, BP 135/54, RR: 19, maintaining oxygen saturation on room air.  EKG: No acute changes.  Troponin x4 negative.  Chest x-ray negative.  ED physician consulted cardiology.  [facility] hospitalist consulted for admission for further evaluation and management of patient's chest pain.  Assessment & Plan:   Chest pain: -Concerning for coronary artery disease. -EKG: No acute findings.  Troponin flat. -Echo result is pending. -Continue aspirin.   Cardiology consulted recommended coronary CTA tomorrow.   Hypertension: Blood pressure elevated in ED -Continue Aldactone -Monitor blood pressure closely.  Hydralazine as needed.  GERD: Continue PPI  Asthma: Stable.  Currently on room air.  Chest x-ray negative.  Continue Singulair and Breo Ellipta  UTI: -Currently on Rocephin.  Urine culture is pending.  DVT prophylaxis: SCD Code Status: Full code Family Communication:  None present at bedside.  Plan of care discussed with patient in length and she verbalized understanding and agreed with it. Disposition Plan: Home  Consultants:  Cardiology  Procedures:  None  Antimicrobials:  Rocephin  Status is: Observation   Subjective: Patient seen and examined.  Lying comfortably on the bed.  Reports that she feels better.  Denies any chest pain, shortness of breath or palpitations.  Objective: Vitals:   02/11/22 0630 02/11/22 0645 02/11/22 0723 02/11/22 0819  BP: (!) 147/68 (!) 162/61 (!) 162/69   Pulse: (!) 58 (!) 53 (!) 56   Resp: 15 15 17    Temp:   98 F (36.7 C)   TempSrc:   Oral   SpO2: 99% 99% 100%   Weight:    74.8 kg  Height:    5\' 5"  (1.651 m)   No intake or output data in the 24  hours ending 02/11/22 1131 Filed Weights   02/11/22 0819  Weight: 74.8 kg    Examination:  General exam: Appears calm and comfortable, on room air, communicating well Respiratory system: Clear to auscultation. Respiratory effort normal. Cardiovascular system: S1 & S2 heard, RRR. No JVD, murmurs, rubs, gallops or clicks. No pedal edema. Gastrointestinal system: Abdomen is nondistended, soft and nontender. No organomegaly or masses felt. Normal bowel sounds heard. Central nervous system: Alert and oriented. No focal neurological deficits. Extremities: Symmetric 5 x 5 power. Skin: No rashes, lesions or ulcers Psychiatry: Judgement and insight appear normal. Mood & affect appropriate.    Data Reviewed: I have personally reviewed following labs and imaging studies  CBC: Recent Labs  Lab 02/09/22 1755 02/10/22 1919 02/11/22 0426  WBC 11.0* 11.1* 7.9  NEUTROABS  --  7.9*  --   HGB 13.0 12.9 11.9*  HCT 38.5 37.8 34.8*  MCV 89.7 91.1 90.9  PLT 302 285 211   Basic Metabolic Panel: Recent Labs  Lab 02/09/22 1755 02/10/22 1919 02/11/22 0426  NA 134* 136 138  K 4.1 4.5 4.0  CL 103 103 106  CO2 23 25 24   GLUCOSE 87 88 92  BUN 25* 14 11  CREATININE 0.88 0.91 0.76  CALCIUM 9.0 9.4 8.7*  MG  --   --  1.8  PHOS  --   --  3.2   GFR: Estimated Creatinine Clearance: 58.6 mL/min (by C-G formula based on SCr of 0.76 mg/dL). Liver Function Tests:  Recent Labs  Lab 02/10/22 1919 02/11/22 0426  AST 15 14*  ALT 17 14  ALKPHOS 48 42  BILITOT 0.7 0.6  PROT 7.1 6.0*  ALBUMIN 3.7 3.2*   No results for input(s): "LIPASE", "AMYLASE" in the last 168 hours. No results for input(s): "AMMONIA" in the last 168 hours. Coagulation Profile: Recent Labs  Lab 02/11/22 0426  INR 1.1   Cardiac Enzymes: Recent Labs  Lab 02/11/22 0426  CKTOTAL 44   BNP (last 3 results) No results for input(s): "PROBNP" in the last 8760 hours. HbA1C: No results for input(s): "HGBA1C" in the last 72  hours. CBG: Recent Labs  Lab 02/09/22 1812  GLUCAP 98   Lipid Profile: Recent Labs    02/11/22 0426  CHOL 161  HDL 47  LDLCALC 104*  TRIG 50  CHOLHDL 3.4   Thyroid Function Tests: Recent Labs    02/11/22 0426  TSH 0.940   Anemia Panel: No results for input(s): "VITAMINB12", "FOLATE", "FERRITIN", "TIBC", "IRON", "RETICCTPCT" in the last 72 hours. Sepsis Labs: No results for input(s): "PROCALCITON", "LATICACIDVEN" in the last 168 hours.  Recent Results (from the past 240 hour(s))  Urine Culture     Status: Abnormal   Collection Time: 02/09/22  7:29 PM   Specimen: Urine, Clean Catch  Result Value Ref Range Status   Specimen Description   Final    URINE, CLEAN CATCH Performed at [facility], [address] [place], [place] [address]    Special Requests   Final    NONE Performed at [facility], [address] [place], [place] [address]    Culture (A)  Final    <10,000 COLONIES/mL INSIGNIFICANT GROWTH Performed at [facility], [address] [place], [place] [address]    Report Status 02/11/2022 FINAL  Final      Radiology Studies: DG Chest Port 1 View  Result Date: 02/09/2022 CLINICAL DATA:  Near syncope.  Lightheadedness. EXAM: PORTABLE CHEST 1 VIEW COMPARISON:  CT 10/24/2021 FINDINGS: The heart size and mediastinal contours are within normal limits. Both lungs are clear. The visualized skeletal structures are unremarkable. IMPRESSION: No active disease. Electronically Signed   By: Kerby Moors M.D.   On: 02/09/2022 18:10    Scheduled Meds:  aspirin EC  81 mg Oral Daily   fluticasone furoate-vilanterol  1 puff Inhalation Daily   latanoprost  1 drop Both Eyes QHS   montelukast  10 mg Oral QHS   pantoprazole  40 mg Oral Daily   spironolactone  25 mg Oral Daily   Continuous Infusions:  cefTRIAXone (ROCEPHIN)  IV Stopped (02/10/22 2342)     LOS: 0 days   Time spent: [place], MD [facility]  If 7PM-7AM, please contact night-coverage [email or web address] 02/11/2022, 11:31 AM

## 2022-02-11 NOTE — ED Notes (Signed)
RN attempted report x2 

## 2022-02-11 NOTE — ED Notes (Signed)
Pt tray at bedside

## 2022-02-11 NOTE — ED Notes (Signed)
Pt requested breakfast. RN notified Attending

## 2022-02-11 NOTE — Consult Note (Signed)
Cardiology Consultation:   Patient ID: Dawn Collins MRN: [id number]; DOB: 1945-09-22  Admit date: 02/10/2022 Date of Consult: 02/11/2022  PCP:  Dawn Bellows, PA-C   [facility] Prov[facility] Cardiologist:  None   {   Patient Profile:   Dawn Collins is a 76 y.o. female with a hx of hypertension and hyperlipidemia who is being seen 02/11/2022 for the evaluation of chest pain at the request of Dr. Doristine Collins.  History of Present Illness:   [name] presented to the hospital yesterday with several days of chest pressure and shortness of breath.  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  She had never had this symptom before.  She thought it was related to low blood sugar sure she ate a snack which did not fully improve the symptoms.  She went home and slept.  On Thursday the symptoms got slightly worse.  Despite the symptoms she was able to work.  She continued to have intermittent chest pressure until yesterday when the symptoms got even worse.  That prompted her to seek care [facility] at [facility].  She has not lost consciousness.  No palpitations.  No changes in medications.  No recent fevers or chills.   Past Medical History:  Diagnosis Date   Arthritis    knees, back.   Asthma    2 weeks cold,no problems now-well controlled   Bursitis, knee    Bil. Hips, not knee   Complication of anesthesia    slow to wake up   Diverticulitis of colon 05-28-11   no current problems   GERD (gastroesophageal reflux disease)    tx. pantoprazole   Hypertension    Multiple thyroid nodules    Pneumonia    PONV (postoperative nausea and vomiting)    Sleep apnea    No cpap now -3 months last due to insurance reasons    Past Surgical History:  Procedure Laterality Date   ABDOMINAL HYSTERECTOMY     partial hyst   BREAST BIOPSY     several- all benign   CARDIAC CATHETERIZATION      10'11   CERVICAL [place]   FOOT SURGERY     Bilateral   KNEE ARTHROSCOPY     right '04   TOTAL KNEE ARTHROPLASTY  06/04/2011   Procedure: TOTAL KNEE ARTHROPLASTY;  Surgeon: Gearlean Alf;  Location: [facility];  Service: Orthopedics;  Laterality: Right;   TOTAL KNEE ARTHROPLASTY Left 08/08/2015   Procedure: TOTAL LEFT KNEE ARTHROPLASTY;  Surgeon: Gaynelle Arabian, MD;  Location: [facility];  Service: Orthopedics;  Laterality: Left;       Inpatient Medications: Scheduled Meds:  aspirin EC  81 mg Oral Daily   fluticasone furoate-vilanterol  1 puff Inhalation Daily   latanoprost  1 drop Both Eyes QHS   montelukast  10 mg Oral QHS   pantoprazole  40 mg Oral Daily   spironolactone  25 mg Oral Daily   Continuous Infusions:  cefTRIAXone (ROCEPHIN)  IV Stopped (02/10/22 2342)   PRN Meds: acetaminophen **OR** acetaminophen, fentaNYL (SUBLIMAZE) injection  Allergies:    Allergies  Allergen Reactions   Morphine And Related Nausea And Vomiting   Avelox [Moxifloxacin Hcl In Nacl] Other (See Comments)    Numbness    Celebrex [Celecoxib] Other (See Comments)    Chest pain    Clarithromycin Nausea And Vomiting  Codeine Other (See Comments)    Sweating,vomiting and shortness of breath   Food Nausea And Vomiting    Seafood (all)   Hycodan [Hydrocodone Bit-Homatrop Mbr] Other (See Comments)    dyspnea   Pentazocine Other (See Comments)    Chest pain   Septra [Bactrim] Nausea And Vomiting   Sertraline Hcl Other (See Comments)    tinnitus   Stadol [Butorphanol Tartrate] Nausea And Vomiting   Duraphen [Phenylephrine-Guaifenesin] Palpitations   Floxin [Ofloxacin] Rash   Penicillins Rash    Has patient had a PCN reaction causing immediate rash, facial/tongue/throat swelling, SOB or lightheadedness with hypotension: Yes Has patient had a PCN reaction causing severe rash involving mucus membranes or skin necrosis: Yes Has patient had a PCN reaction that required  hospitalization No Has patient had a PCN reaction occurring within the last 10 years: No If all of the above answers are "NO", then may proceed with Cephalosporin use.     Social History:   Social History   Socioeconomic History   Marital status: Widowed    Spouse name: Not on file   Number of children: Not on file   Years of education: Not on file   Highest education level: Not on file  Occupational History   Not on file  Tobacco Use   Smoking status: Never   Smokeless tobacco: Never  Substance and Sexual Activity   Alcohol use: No   Drug use: No   Sexual activity: Not on file  Other Topics Concern   Not on file  Social History Narrative   Not on file   Social Determinants of Health   Financial Resource Strain: Not on file  Food Insecurity: Not on file  Transportation Needs: Not on file  Physical Activity: Not on file  Stress: Not on file  Social Connections: Not on file  Intimate Partner Violence: Not on file    Family History:   History reviewed. No pertinent family history.   ROS:  Please see the history of present illness.   All other ROS reviewed and negative.     Physical Exam/Data:   Vitals:   02/11/22 0630 02/11/22 0645 02/11/22 0723 02/11/22 0819  BP: (!) 147/68 (!) 162/61 (!) 162/69   Pulse: (!) 58 (!) 53 (!) 56   Resp: 15 15 17    Temp:   98 F (36.7 C)   TempSrc:   Oral   SpO2: 99% 99% 100%   Weight:    74.8 kg  Height:    5\' 5"  (1.651 m)   No intake or output data in the 24 hours ending 02/11/22 1034    02/11/2022    8:19 AM 02/09/2022    5:22 PM 02/27/2021   11:33 AM  Last 3 Weights  Weight (lbs) 164 lb 14.5 oz 165 lb 158 lb  Weight (kg) 74.8 kg 74.844 kg 71.668 kg     Body mass index is 27.44 kg/m.  General:  Well nourished, well developed, in no acute distress.  Appears younger than stated age 15: normal Neck: no JVD Vascular: No carotid bruits; Distal pulses 2+ bilaterally Cardiac:  normal S1, S2; RRR; no murmur  Lungs:   clear to auscultation bilaterally, no wheezing, rhonchi or rales  Abd: soft, nontender, no hepatomegaly  Ext: no edema Musculoskeletal:  No deformities, BUE and BLE strength normal and equal Skin: warm and dry  Neuro:  CNs 2-12 intact, no focal abnormalities noted Psych:  Normal affect   EKG:  The EKG was  personally reviewed and demonstrates: Sinus rhythm Telemetry:  Telemetry was personally reviewed and demonstrates: Sinus rhythm  Relevant CV Studies:  February 11, 2022 echo personally reviewed (formal report not completed yet) EF appears to be normal.  Basal septum appears thickened but no evidence of obstruction.  No severe valve abnormalities that I can see.  No significant effusion.   Laboratory Data:  High Sensitivity Troponin:   Recent Labs  Lab 02/09/22 1755 02/09/22 1933 02/10/22 1919 02/10/22 2056  TROPONINIHS 5 4 7 6      Chemistry Recent Labs  Lab 02/09/22 1755 02/10/22 1919 02/11/22 0426  NA 134* 136 138  K 4.1 4.5 4.0  CL 103 103 106  CO2 23 25 24   GLUCOSE 87 88 92  BUN 25* 14 11  CREATININE 0.88 0.91 0.76  CALCIUM 9.0 9.4 8.7*  MG  --   --  1.8  GFRNONAA >60 >60 >60  ANIONGAP 8 8 8     Recent Labs  Lab 02/10/22 1919 02/11/22 0426  PROT 7.1 6.0*  ALBUMIN 3.7 3.2*  AST 15 14*  ALT 17 14  ALKPHOS 48 42  BILITOT 0.7 0.6   Lipids  Recent Labs  Lab 02/11/22 0426  CHOL 161  TRIG 50  HDL 47  LDLCALC 104*  CHOLHDL 3.4    Hematology Recent Labs  Lab 02/09/22 1755 02/10/22 1919 02/11/22 0426  WBC 11.0* 11.1* 7.9  RBC 4.29 4.15 3.83*  HGB 13.0 12.9 11.9*  HCT 38.5 37.8 34.8*  MCV 89.7 91.1 90.9  MCH 30.3 31.1 31.1  MCHC 33.8 34.1 34.2  RDW 12.9 12.8 12.7  PLT 302 285 252   Thyroid  Recent Labs  Lab 02/11/22 0426  TSH 0.940    BNPNo results for input(s): "BNP", "PROBNP" in the last 168 hours.  DDimer No results for input(s): "DDIMER" in the last 168 hours.   Radiology/Studies:  DG Chest Port 1 View  Result Date:  02/09/2022 CLINICAL DATA:  Near syncope.  Lightheadedness. EXAM: PORTABLE CHEST 1 VIEW COMPARISON:  CT 10/24/2021 FINDINGS: The heart size and mediastinal contours are within normal limits. Both lungs are clear. The visualized skeletal structures are unremarkable. IMPRESSION: No active disease. Electronically Signed   By: Kerby Moors M.D.   On: 02/09/2022 18:10     Assessment and Plan:    Ms. Rekowski is a 76 year old woman with a history of hypertension who presents with somewhat typical sounding angina occurring over the last week.  Prior's CT scan of the chest in 2022 did show coronary artery calcium in the left main, LAD and left circumflex but to my knowledge this is never been further evaluated.  I am concerned that her symptoms represent obstructive coronary artery disease.  She should be admitted and have a coronary evaluation.  #Chest pain Concerning for coronary obstructive disease. Plan for coronary CTA tomorrow. Continue aspirin 81 Follow-up echo  #Hypertension Slightly above goal in the emergency department. Continue spironolactone. If continues to be hypertensive, consider adding low-dose ARB.  Cardiology to follow.   For questions or updates, please contact [place] Please consult [email or web address] for contact info under    Signed, Vickie Epley, MD  02/11/2022 10:34 AM

## 2022-02-11 NOTE — ED Notes (Signed)
Echo at the bedside °

## 2022-02-12 ENCOUNTER — Inpatient Hospital Stay (HOSPITAL_COMMUNITY)
Admit: 2022-02-12 | Discharge: 2022-02-12 | Disposition: A | Discharge status: Home / Self Care | Payer: Medicare Other | Attending: Internal Medicine | Admitting: Internal Medicine

## 2022-02-12 ENCOUNTER — Inpatient Hospital Stay (HOSPITAL_COMMUNITY): Payer: Medicare Other

## 2022-02-12 ENCOUNTER — Other Ambulatory Visit: Payer: Self-pay | Admitting: Internal Medicine

## 2022-02-12 DIAGNOSIS — I2510 Atherosclerotic heart disease of native coronary artery without angina pectoris: Secondary | ICD-10-CM | POA: Diagnosis not present

## 2022-02-12 DIAGNOSIS — R931 Abnormal findings on diagnostic imaging of heart and coronary circulation: Secondary | ICD-10-CM

## 2022-02-12 DIAGNOSIS — E785 Hyperlipidemia, unspecified: Secondary | ICD-10-CM | POA: Diagnosis not present

## 2022-02-12 DIAGNOSIS — I10 Essential (primary) hypertension: Secondary | ICD-10-CM | POA: Diagnosis not present

## 2022-02-12 DIAGNOSIS — J4520 Mild intermittent asthma, uncomplicated: Secondary | ICD-10-CM | POA: Diagnosis not present

## 2022-02-12 DIAGNOSIS — R079 Chest pain, unspecified: Secondary | ICD-10-CM | POA: Diagnosis not present

## 2022-02-12 LAB — CBC
HCT: 35.8 % — ABNORMAL LOW (ref 36.0–46.0)
Hemoglobin: 12.2 g/dL (ref 12.0–15.0)
MCH: 30.9 pg (ref 26.0–34.0)
MCHC: 34.1 g/dL (ref 30.0–36.0)
MCV: 90.6 fL (ref 80.0–100.0)
Platelets: 245 10*3/uL (ref 150–400)
RBC: 3.95 MIL/uL (ref 3.87–5.11)
RDW: 12.6 % (ref 11.5–15.5)
WBC: 8.4 10*3/uL (ref 4.0–10.5)
nRBC: 0 % (ref 0.0–0.2)

## 2022-02-12 LAB — BASIC METABOLIC PANEL
Anion gap: 8 (ref 5–15)
BUN: 14 mg/dL (ref 8–23)
CO2: 24 mmol/L (ref 22–32)
Calcium: 9.1 mg/dL (ref 8.9–10.3)
Chloride: 104 mmol/L (ref 98–111)
Creatinine, Ser: 0.87 mg/dL (ref 0.44–1.00)
GFR, Estimated: >60 mL/min (ref >60)
Glucose, Bld: 99 mg/dL (ref 70–99)
Potassium: 4.1 mmol/L (ref 3.5–5.1)
Sodium: 136 mmol/L (ref 135–145)

## 2022-02-12 LAB — URINE CULTURE: Culture: NO GROWTH

## 2022-02-12 LAB — MAGNESIUM: Magnesium: 1.9 mg/dL (ref 1.7–2.4)

## 2022-02-12 MED ORDER — METOPROLOL TARTRATE 50 MG PO TABS
100.0000 mg | ORAL_TABLET | Freq: Once | ORAL | Status: AC
  Administered 2022-02-12: 100 mg via ORAL
  Filled 2022-02-12: qty 2

## 2022-02-12 MED ORDER — ATORVASTATIN CALCIUM 40 MG PO TABS: 40.0000 mg | ORAL_TABLET | Freq: Every day | ORAL | 1 refills | Status: DC

## 2022-02-12 MED ORDER — METOPROLOL TARTRATE 50 MG PO TABS: 100.0000 mg | ORAL_TABLET | Freq: Once | ORAL | Status: DC

## 2022-02-12 MED ORDER — LABETALOL HCL 5 MG/ML IV SOLN: 5.0000 mg | Freq: Once | INTRAVENOUS | Status: DC

## 2022-02-12 MED ORDER — CEPHALEXIN 500 MG PO CAPS: 500.0000 mg | ORAL_CAPSULE | Freq: Two times a day (BID) | ORAL | 0 refills | Status: DC

## 2022-02-12 MED ORDER — HYDRALAZINE HCL 20 MG/ML IJ SOLN
5.0000 mg | INTRAMUSCULAR | Status: DC | PRN
  Administered 2022-02-12: 5 mg via INTRAVENOUS
  Filled 2022-02-12: qty 1

## 2022-02-12 MED ORDER — PANTOPRAZOLE SODIUM 40 MG PO TBEC: 40.0000 mg | DELAYED_RELEASE_TABLET | Freq: Two times a day (BID) | ORAL | 0 refills | Status: DC

## 2022-02-12 MED ORDER — NITROGLYCERIN 0.4 MG SL SUBL
SUBLINGUAL_TABLET | SUBLINGUAL | Status: AC
  Filled 2022-02-12: qty 2

## 2022-02-12 MED ORDER — IOHEXOL 350 MG/ML SOLN
100.0000 mL | Freq: Once | INTRAVENOUS | Status: AC | PRN
  Administered 2022-02-12: 100 mL via INTRAVENOUS

## 2022-02-12 NOTE — Discharge Summary (Incomplete)
Physician Discharge Summary   Patient: Dawn Collins MRN: [id number] DOB: 04-20-1946  Admit date:     02/10/2022  Discharge date: 02/12/22  Discharge Physician: Patrecia Pour   PCP: Jonathon Bellows, PA-C   Recommendations at discharge:  The symptoms you experienced are not likely to be due to a heart condition. The CT of the coronary arteries showed you do have coronary artery disease, though this is mild.  - Because you have the CAD, however, to reduce your risk of heart attack in the future, you should continue taking aspirin 81mg  daily and add atorvastatin 40mg  daily.  - Your symptoms may be related to GERD and/or esophagitis, so it is suggested that you take protonix 40mg  TWICE daily for the next month and monitor symptoms.  - To treat a UTI, continue keflex twice daily for 7 days. - If your symptoms return, seek medical attention right away. Otherwise, follow up with your PCP in the next 1-2 weeks, and consider a GI evaluation.***  Discharge Diagnoses: Principal Problem:   Chest pain Active Problems:   Asthma, chronic   Hyperlipidemia   Essential hypertension   UTI (urinary tract infection)  Hospital Course: Dawn Collins is a 76 y.o. female with medical history significant of Asthma, HTN, HLD, goiter presented with chest pain.   Upon arrival to ED: Patient pulse: 63, BP 135/54, RR: 19, maintaining oxygen saturation on room air.  EKG: No acute changes.  Troponin x4 negative.  Chest x-ray negative.  ED physician consulted cardiology.  [facility] hospitalist consulted for admission for further evaluation and management of patient's chest pain.***  Coronary CTA was performed 7/24 and reportedly shows minimal-mild nonobstructive CAD, primarily in LAD. Cardiology recommends discharge on statin and continuing aspirin. ***  Assessment and Plan: Chest pain: -Concerning for coronary artery disease. -EKG: No acute findings.  Troponin flat. -Echo result is pending. -Continue  aspirin.   Cardiology consulted recommended coronary CTA tomorrow.    Hypertension: Blood pressure elevated in ED -Continue Aldactone -Monitor blood pressure closely.  Hydralazine as needed.   GERD: Continue PPI   Asthma: Stable.  Currently on room air.  Chest x-ray negative.  Continue Singulair and Breo Ellipta   UTI: -Currently on Rocephin.  Urine culture is pending.  Consultants: Cardiology Procedures performed: CTA coronary   Disposition: Home Diet recommendation:  Discharge Diet Orders (From admission, onward)     Start     Ordered   02/12/22 0000  Diet - low sodium heart healthy        02/12/22 1829          DISCHARGE MEDICATION: Allergies as of 02/12/2022       Reactions   Morphine And Related Nausea And Vomiting   Avelox [moxifloxacin Hcl In Nacl] Other (See Comments)   Numbness   Celebrex [celecoxib] Other (See Comments)   Chest pain   Clarithromycin Nausea And Vomiting   Codeine Other (See Comments)   Sweating,vomiting and shortness of breath   Food Nausea And Vomiting   Seafood (all)   Hycodan [hydrocodone Bit-homatrop Mbr] Other (See Comments)   dyspnea   Pentazocine Other (See Comments)   Chest pain   Septra [bactrim] Nausea And Vomiting   Sertraline Hcl Other (See Comments)   tinnitus   Stadol [butorphanol Tartrate] Nausea And Vomiting   Duraphen [phenylephrine-guaifenesin] Palpitations   Floxin [ofloxacin] Rash   Penicillins Rash   Has patient had a PCN reaction causing immediate rash, facial/tongue/throat swelling, SOB or lightheadedness with hypotension: Yes Has  patient had a PCN reaction causing severe rash involving mucus membranes or skin necrosis: Yes Has patient had a PCN reaction that required hospitalization No Has patient had a PCN reaction occurring within the last 10 years: No If all of the above answers are "NO", then may proceed with Cephalosporin use.        Medication List     STOP taking these medications    apixaban  2.5 MG Tabs tablet Commonly known as: ELIQUIS   HYDROmorphone 2 MG tablet Commonly known as: DILAUDID       TAKE these medications    acetaminophen 500 MG tablet Commonly known as: TYLENOL Take 500-1,000 mg by mouth every 6 (six) hours as needed for moderate pain. For arthritis   albuterol 108 (90 Base) MCG/ACT inhaler Commonly known as: VENTOLIN HFA Inhale 2 puffs into the lungs every 6 (six) hours as needed for wheezing or shortness of breath.   aspirin EC 81 MG tablet Take 81 mg by mouth daily.   atorvastatin 40 MG tablet Commonly known as: Lipitor Take 1 tablet (40 mg total) by mouth daily.   Breo Ellipta 100-25 MCG/ACT Aepb Generic drug: fluticasone furoate-vilanterol Inhale 1 puff by mouth into the lungs daily. What changed:  when to take this reasons to take this   cephALEXin 500 MG capsule Commonly known as: KEFLEX Take 1 capsule (500 mg total) by mouth 2 (two) times daily.   diltiazem 360 MG 24 hr capsule Commonly known as: CARDIZEM CD Take 360 mg by mouth daily.   estradiol 2 MG tablet Commonly known as: ESTRACE Take 2 mg by mouth daily.   ezetimibe 10 MG tablet Commonly known as: ZETIA Take 10 mg by mouth every evening.   famotidine 20 MG tablet Commonly known as: PEPCID Take 1 tablet (20 mg total) by mouth 2 (two) times daily.   fluticasone 50 MCG/ACT nasal spray Commonly known as: FLONASE Place 1-2 sprays into both nostrils daily as needed for allergies.   lactose free nutrition Liqd Take 237 mLs by mouth daily.   latanoprost 0.005 % ophthalmic solution Commonly known as: XALATAN Place 1 drop into both eyes at bedtime.   meloxicam 15 MG tablet Commonly known as: MOBIC Take 15 mg by mouth daily as needed for pain.   montelukast 10 MG tablet Commonly known as: SINGULAIR Take 10 mg by mouth at bedtime.   Mucinex 600 MG 12 hr tablet Generic drug: guaiFENesin Take 600 mg by mouth 2 (two) times daily as needed for cough.    pantoprazole 40 MG tablet Commonly known as: PROTONIX Take 1 tablet (40 mg total) by mouth 2 (two) times daily. What changed: when to take this   spironolactone 25 MG tablet Commonly known as: ALDACTONE Take 25 mg by mouth daily.   telmisartan 80 MG tablet Commonly known as: MICARDIS Take 80 mg by mouth daily.        Follow-up Information     Podraza, Sindy Messing, PA-C Follow up.   Specialty: Physician Assistant Contact information: [address] [place] [address] [place], Cassie Freer, MD Follow up.   Specialties: Cardiology, Internal Medicine, Radiology Contact information: [place] [address] [phone]                Discharge Exam: Danley Danker Weights   02/11/22 0819  Weight: 74.8 kg  BP (!) 126/48 (BP Location: Left Arm)   Pulse (!) 57  Temp 97.8 F (36.6 C) (Oral)   Resp 17   Ht 5\' 5"  (1.651 m)   Wt 74.8 kg   SpO2 95%   BMI 27.44 kg/m   Well-appearing, interactive female in no distress Clear, nonlabored RRR without MRG or edema No abd tenderness or distention, +BS  Condition at discharge: stable  The results of significant diagnostics from this hospitalization (including imaging, microbiology, ancillary and laboratory) are listed below for reference.   Imaging Studies: ECHOCARDIOGRAM COMPLETE  Result Date: 02/11/2022    ECHOCARDIOGRAM REPORT   Patient Name:   Dawn Collins Date of Exam: 02/11/2022 Medical Rec #:  [id number]       Height:       65.0 in Accession #:    [id number]      Weight:       164.9 lb Date of Birth:  1946/05/28       BSA:          1.822 m Patient Age:    76 years        BP:           162/69 mmHg Patient Gender: F               HR:           60 bpm. Exam Location:  Inpatient Procedure: 2D Echo, Cardiac Doppler and Color Doppler Indications:    Chest pain  History:        Patient has no prior history of Echocardiogram examinations.                 Risk  Factors:Hypertension.  Sonographer:    Jyl Heinz Referring Phys: Dawn  1. Left ventricular ejection fraction, by estimation, is 60 to 65%. The left ventricle has normal function. The left ventricle has no regional wall motion abnormalities. There is mild left ventricular hypertrophy. Left ventricular diastolic parameters are consistent with Grade I diastolic dysfunction (impaired relaxation).  2. Right ventricular systolic function is normal. The right ventricular size is normal. There is mildly elevated pulmonary artery systolic pressure. The estimated right ventricular systolic pressure is 47.4 mmHg.  3. Left atrial size was mildly dilated.  4. The mitral valve is grossly normal. Trivial mitral valve regurgitation. No evidence of mitral stenosis.  5. The aortic valve is grossly normal. There is mild calcification of the aortic valve. Aortic valve regurgitation is mild. Aortic valve sclerosis/calcification is present, without any evidence of aortic stenosis.  6. The inferior vena cava is normal in size with <50% respiratory variability, suggesting right atrial pressure of 8 mmHg. FINDINGS  Left Ventricle: Left ventricular ejection fraction, by estimation, is 60 to 65%. The left ventricle has normal function. The left ventricle has no regional wall motion abnormalities. The left ventricular internal cavity size was normal in size. There is  mild left ventricular hypertrophy. Left ventricular diastolic parameters are consistent with Grade I diastolic dysfunction (impaired relaxation). Right Ventricle: The right ventricular size is normal. No increase in right ventricular wall thickness. Right ventricular systolic function is normal. There is mildly elevated pulmonary artery systolic pressure. The tricuspid regurgitant velocity is 2.67  m/s, and with an assumed right atrial pressure of 8 mmHg, the estimated right ventricular systolic pressure is 25.9 mmHg. Left Atrium: Left atrial  size was mildly dilated. Right Atrium: Right atrial size was normal in size. Pericardium: There is no evidence of pericardial effusion. Mitral Valve: The mitral valve is grossly normal. Mild mitral annular calcification. Trivial  mitral valve regurgitation. No evidence of mitral valve stenosis. Tricuspid Valve: The tricuspid valve is normal in structure. Tricuspid valve regurgitation is mild . No evidence of tricuspid stenosis. Aortic Valve: The aortic valve is grossly normal. There is mild calcification of the aortic valve. Aortic valve regurgitation is mild. Aortic regurgitation PHT measures 499 msec. Aortic valve sclerosis/calcification is present, without any evidence of aortic stenosis. Aortic valve peak gradient measures 10.1 mmHg. Pulmonic Valve: The pulmonic valve was normal in structure. Pulmonic valve regurgitation is trivial. No evidence of pulmonic stenosis. Aorta: The aortic root is normal in size and structure. Venous: The inferior vena cava is normal in size with less than 50% respiratory variability, suggesting right atrial pressure of 8 mmHg. IAS/Shunts: No atrial level shunt detected by color flow Doppler.  LEFT VENTRICLE PLAX 2D LVIDd:         4.00 cm     Diastology LVIDs:         2.80 cm     LV e' medial:    5.00 cm/s LV PW:         1.10 cm     LV E/e' medial:  12.3 LV IVS:        0.90 cm     LV e' lateral:   6.64 cm/s LVOT diam:     2.00 cm     LV E/e' lateral: 9.2 LV SV:         96 LV SV Index:   53 LVOT Area:     3.14 cm  LV Volumes (MOD) LV vol d, MOD A2C: 79.9 ml LV vol d, MOD A4C: 85.0 ml LV vol s, MOD A2C: 33.4 ml LV vol s, MOD A4C: 36.0 ml LV SV MOD A2C:     46.5 ml LV SV MOD A4C:     85.0 ml LV SV MOD BP:      49.6 ml RIGHT VENTRICLE             IVC RV Basal diam:  3.50 cm     IVC diam: 1.80 cm RV Mid diam:    2.80 cm RV S prime:     10.30 cm/s TAPSE (M-mode): 2.5 cm LEFT ATRIUM             Index        RIGHT ATRIUM           Index LA diam:        3.50 cm 1.92 cm/m   RA Area:     12.50  cm LA Vol (A2C):   44.4 ml 24.36 ml/m  RA Volume:   29.10 ml  15.97 ml/m LA Vol (A4C):   49.1 ml 26.94 ml/m LA Biplane Vol: 49.3 ml 27.05 ml/m  AORTIC VALVE AV Area (Vmax): 2.43 cm AV Vmax:        159.00 cm/s AV Peak Grad:   10.1 mmHg LVOT Vmax:      123.00 cm/s LVOT Vmean:     77.600 cm/s LVOT VTI:       0.307 m AI PHT:         499 msec  AORTA Ao Root diam: 2.90 cm Ao Asc diam:  3.10 cm MITRAL VALVE               TRICUSPID VALVE MV Area (PHT): 2.91 cm    TR Peak grad:   28.5 mmHg MV Decel Time: 261 msec    TR Vmax:        267.00 cm/s  MV E velocity: 61.30 cm/s MV A velocity: 90.30 cm/s  SHUNTS MV E/A ratio:  0.68        Systemic VTI:  0.31 m                            Systemic Diam: 2.00 cm Cherlynn Kaiser MD Electronically signed by Cherlynn Kaiser MD Signature Date/Time: 02/11/2022/1:23:52 PM    Final    DG Chest Port 1 View  Result Date: 02/09/2022 CLINICAL DATA:  Near syncope.  Lightheadedness. EXAM: PORTABLE CHEST 1 VIEW COMPARISON:  CT 10/24/2021 FINDINGS: The heart size and mediastinal contours are within normal limits. Both lungs are clear. The visualized skeletal structures are unremarkable. IMPRESSION: No active disease. Electronically Signed   By: Kerby Moors M.D.   On: 02/09/2022 18:10    Microbiology: Results for orders placed or performed during the hospital encounter of 02/10/22  Urine Culture     Status: None   Collection Time: 02/10/22 10:42 PM   Specimen: Urine, Clean Catch  Result Value Ref Range Status   Specimen Description URINE, CLEAN CATCH  Final   Special Requests NONE  Final   Culture   Final    NO GROWTH Performed at [facility], [place] [address], [place], [place] [address]    Report Status 02/12/2022 FINAL  Final  MRSA Next Gen by PCR, Nasal     Status: None   Collection Time: 02/11/22  8:53 PM   Specimen: Nasal Mucosa; Nasal Swab  Result Value Ref Range Status   MRSA by PCR Next Gen NOT DETECTED NOT DETECTED Final    Comment: (NOTE) The GeneXpert  MRSA Assay (FDA approved for NASAL specimens only), is one component of a comprehensive MRSA colonization surveillance program. It is not intended to diagnose MRSA infection nor to guide or monitor treatment for MRSA infections. Test performance is not FDA approved in patients less than 60 years old. Performed at [facility], [place] [address] [place], [place] [address]     Labs: CBC: Recent Labs  Lab 02/09/22 1755 02/10/22 1919 02/11/22 0426 02/12/22 0036  WBC 11.0* 11.1* 7.9 8.4  NEUTROABS  --  7.9*  --   --   HGB 13.0 12.9 11.9* 12.2  HCT 38.5 37.8 34.8* 35.8*  MCV 89.7 91.1 90.9 90.6  PLT 302 285 252 325   Basic Metabolic Panel: Recent Labs  Lab 02/09/22 1755 02/10/22 1919 02/11/22 0426 02/12/22 0036  NA 134* 136 138 136  K 4.1 4.5 4.0 4.1  CL 103 103 106 104  CO2 23 25 24 24   GLUCOSE 87 88 92 99  BUN 25* 14 11 14   CREATININE 0.88 0.91 0.76 0.87  CALCIUM 9.0 9.4 8.7* 9.1  MG  --   --  1.8 1.9  PHOS  --   --  3.2  --    Liver Function Tests: Recent Labs  Lab 02/10/22 1919 02/11/22 0426  AST 15 14*  ALT 17 14  ALKPHOS 48 42  BILITOT 0.7 0.6  PROT 7.1 6.0*  ALBUMIN 3.7 3.2*   CBG: Recent Labs  Lab 02/09/22 1812  GLUCAP 98    Discharge time spent: greater than 30 minutes.  Signed: Patrecia Pour, MD [facility] 02/12/2022

## 2022-02-12 NOTE — Progress Notes (Signed)
Ready for discharge home. Discharge instructions given to pt. Awaiting  for transport home.

## 2022-02-12 NOTE — Progress Notes (Signed)
Transported to radiology for cardiac CT by bed awake and alert.

## 2022-02-12 NOTE — Progress Notes (Signed)
Please send CT for FFR - Dr. Debara Pickett

## 2022-02-12 NOTE — Progress Notes (Signed)
Cardiology Progress Note  Patient ID: Dawn Collins MRN: [id number] DOB: October 01, 1945 Date of Encounter: 02/12/2022  Primary Cardiologist: None  Subjective   Chief Complaint:   HPI: None.  No further chest pain.  ROS:  All other ROS reviewed and negative. Pertinent positives noted in the HPI.     Inpatient Medications  Scheduled Meds:  aspirin EC  81 mg Oral Daily   ezetimibe  10 mg Oral QPM   fluticasone furoate-vilanterol  1 puff Inhalation Daily   irbesartan  300 mg Oral Daily   labetalol  5 mg Intravenous Once   latanoprost  1 drop Both Eyes QHS   metoprolol tartrate  100 mg Oral Once   montelukast  10 mg Oral QHS   pantoprazole  40 mg Oral Daily   spironolactone  25 mg Oral Daily   Continuous Infusions:  cefTRIAXone (ROCEPHIN)  IV Stopped (02/11/22 2302)   PRN Meds: acetaminophen **OR** acetaminophen, albuterol, fentaNYL (SUBLIMAZE) injection, fluticasone, hydrALAZINE   Vital Signs   Vitals:   02/12/22 0037 02/12/22 0422 02/12/22 0512 02/12/22 0627  BP: (!) 164/58 (!) 173/63 (!) 188/60 (!) 182/69  Pulse: 70 65    Resp: 16 19 14 17   Temp: 98.8 F (37.1 C) 98.1 F (36.7 C)    TempSrc: Oral Oral    SpO2: 99% 99%    Weight:      Height:        Intake/Output Summary (Last 24 hours) at 02/12/2022 0700 Last data filed at 02/12/2022 7829 Gross per 24 hour  Intake 540 ml  Output 300 ml  Net 240 ml      02/11/2022    8:19 AM 02/09/2022    5:22 PM 02/27/2021   11:33 AM  Last 3 Weights  Weight (lbs) 164 lb 14.5 oz 165 lb 158 lb  Weight (kg) 74.8 kg 74.844 kg 71.668 kg      Telemetry  Overnight telemetry shows sinus rhythm in the 60s, which I personally reviewed.   ECG  The most recent ECG shows normal sinus rhythm heart rate 59, no acute ischemic changes, which I personally reviewed.   Physical Exam   Vitals:   02/12/22 0037 02/12/22 0422 02/12/22 0512 02/12/22 0627  BP: (!) 164/58 (!) 173/63 (!) 188/60 (!) 182/69  Pulse: 70 65    Resp: 16 19 14 17    Temp: 98.8 F (37.1 C) 98.1 F (36.7 C)    TempSrc: Oral Oral    SpO2: 99% 99%    Weight:      Height:        Intake/Output Summary (Last 24 hours) at 02/12/2022 0700 Last data filed at 02/12/2022 5621 Gross per 24 hour  Intake 540 ml  Output 300 ml  Net 240 ml       02/11/2022    8:19 AM 02/09/2022    5:22 PM 02/27/2021   11:33 AM  Last 3 Weights  Weight (lbs) 164 lb 14.5 oz 165 lb 158 lb  Weight (kg) 74.8 kg 74.844 kg 71.668 kg    Body mass index is 27.44 kg/m.  General: Well nourished, well developed, in no acute distress Head: Atraumatic, normal size  Eyes: PEERLA, EOMI  Neck: Supple, no JVD Endocrine: No thryomegaly Cardiac: Normal S1, S2; RRR; no murmurs, rubs, or gallops Lungs: Clear to auscultation bilaterally, no wheezing, rhonchi or rales  Abd: Soft, nontender, no hepatomegaly  Ext: No edema, pulses 2+ Musculoskeletal: No deformities, BUE and BLE strength normal and equal Skin: Warm and dry,  no rashes   Neuro: Alert and oriented to person, place, time, and situation, CNII-XII grossly intact, no focal deficits  Psych: Normal mood and affect   Labs  High Sensitivity Troponin:   Recent Labs  Lab 02/09/22 1755 02/09/22 1933 02/10/22 1919 02/10/22 2056  TROPONINIHS 5 4 7 6      Cardiac EnzymesNo results for input(s): "TROPONINI" in the last 168 hours. No results for input(s): "TROPIPOC" in the last 168 hours.  Chemistry Recent Labs  Lab 02/10/22 1919 02/11/22 0426 02/12/22 0036  NA 136 138 136  K 4.5 4.0 4.1  CL 103 106 104  CO2 25 24 24   GLUCOSE 88 92 99  BUN 14 11 14   CREATININE 0.91 0.76 0.87  CALCIUM 9.4 8.7* 9.1  PROT 7.1 6.0*  --   ALBUMIN 3.7 3.2*  --   AST 15 14*  --   ALT 17 14  --   ALKPHOS 48 42  --   BILITOT 0.7 0.6  --   GFRNONAA >60 >60 >60  ANIONGAP 8 8 8     Hematology Recent Labs  Lab 02/10/22 1919 02/11/22 0426 02/12/22 0036  WBC 11.1* 7.9 8.4  RBC 4.15 3.83* 3.95  HGB 12.9 11.9* 12.2  HCT 37.8 34.8* 35.8*  MCV 91.1  90.9 90.6  MCH 31.1 31.1 30.9  MCHC 34.1 34.2 34.1  RDW 12.8 12.7 12.6  PLT 285 252 245   BNPNo results for input(s): "BNP", "PROBNP" in the last 168 hours.  DDimer No results for input(s): "DDIMER" in the last 168 hours.   Radiology  ECHOCARDIOGRAM COMPLETE  Result Date: 02/11/2022    ECHOCARDIOGRAM REPORT   Patient Name:   Dawn Collins Date of Exam: 02/11/2022 Medical Rec #:  [id number]       Height:       65.0 in Accession #:    [id number]      Weight:       164.9 lb Date of Birth:  Jun 01, 1946       BSA:          1.822 m Patient Age:    63 years        BP:           162/69 mmHg Patient Gender: F               HR:           60 bpm. Exam Location:  Inpatient Procedure: 2D Echo, Cardiac Doppler and Color Doppler Indications:    Chest pain  History:        Patient has no prior history of Echocardiogram examinations.                 Risk Factors:Hypertension.  Sonographer:    Jyl Heinz Referring Phys: [place]  1. Left ventricular ejection fraction, by estimation, is 60 to 65%. The left ventricle has normal function. The left ventricle has no regional wall motion abnormalities. There is mild left ventricular hypertrophy. Left ventricular diastolic parameters are consistent with Grade I diastolic dysfunction (impaired relaxation).  2. Right ventricular systolic function is normal. The right ventricular size is normal. There is mildly elevated pulmonary artery systolic pressure. The estimated right ventricular systolic pressure is 82.9 mmHg.  3. Left atrial size was mildly dilated.  4. The mitral valve is grossly normal. Trivial mitral valve regurgitation. No evidence of mitral stenosis.  5. The aortic valve is grossly normal. There is mild calcification of the aortic valve. Aortic valve  regurgitation is mild. Aortic valve sclerosis/calcification is present, without any evidence of aortic stenosis.  6. The inferior vena cava is normal in size with <50% respiratory variability,  suggesting right atrial pressure of 8 mmHg. FINDINGS  Left Ventricle: Left ventricular ejection fraction, by estimation, is 60 to 65%. The left ventricle has normal function. The left ventricle has no regional wall motion abnormalities. The left ventricular internal cavity size was normal in size. There is  mild left ventricular hypertrophy. Left ventricular diastolic parameters are consistent with Grade I diastolic dysfunction (impaired relaxation). Right Ventricle: The right ventricular size is normal. No increase in right ventricular wall thickness. Right ventricular systolic function is normal. There is mildly elevated pulmonary artery systolic pressure. The tricuspid regurgitant velocity is 2.67  m/s, and with an assumed right atrial pressure of 8 mmHg, the estimated right ventricular systolic pressure is 11.9 mmHg. Left Atrium: Left atrial size was mildly dilated. Right Atrium: Right atrial size was normal in size. Pericardium: There is no evidence of pericardial effusion. Mitral Valve: The mitral valve is grossly normal. Mild mitral annular calcification. Trivial mitral valve regurgitation. No evidence of mitral valve stenosis. Tricuspid Valve: The tricuspid valve is normal in structure. Tricuspid valve regurgitation is mild . No evidence of tricuspid stenosis. Aortic Valve: The aortic valve is grossly normal. There is mild calcification of the aortic valve. Aortic valve regurgitation is mild. Aortic regurgitation PHT measures 499 msec. Aortic valve sclerosis/calcification is present, without any evidence of aortic stenosis. Aortic valve peak gradient measures 10.1 mmHg. Pulmonic Valve: The pulmonic valve was normal in structure. Pulmonic valve regurgitation is trivial. No evidence of pulmonic stenosis. Aorta: The aortic root is normal in size and structure. Venous: The inferior vena cava is normal in size with less than 50% respiratory variability, suggesting right atrial pressure of 8 mmHg. IAS/Shunts: No  atrial level shunt detected by color flow Doppler.  LEFT VENTRICLE PLAX 2D LVIDd:         4.00 cm     Diastology LVIDs:         2.80 cm     LV e' medial:    5.00 cm/s LV PW:         1.10 cm     LV E/e' medial:  12.3 LV IVS:        0.90 cm     LV e' lateral:   6.64 cm/s LVOT diam:     2.00 cm     LV E/e' lateral: 9.2 LV SV:         96 LV SV Index:   53 LVOT Area:     3.14 cm  LV Volumes (MOD) LV vol d, MOD A2C: 79.9 ml LV vol d, MOD A4C: 85.0 ml LV vol s, MOD A2C: 33.4 ml LV vol s, MOD A4C: 36.0 ml LV SV MOD A2C:     46.5 ml LV SV MOD A4C:     85.0 ml LV SV MOD BP:      49.6 ml RIGHT VENTRICLE             IVC RV Basal diam:  3.50 cm     IVC diam: 1.80 cm RV Mid diam:    2.80 cm RV S prime:     10.30 cm/s TAPSE (M-mode): 2.5 cm LEFT ATRIUM             Index        RIGHT ATRIUM  Index LA diam:        3.50 cm 1.92 cm/m   RA Area:     12.50 cm LA Vol (A2C):   44.4 ml 24.36 ml/m  RA Volume:   29.10 ml  15.97 ml/m LA Vol (A4C):   49.1 ml 26.94 ml/m LA Biplane Vol: 49.3 ml 27.05 ml/m  AORTIC VALVE AV Area (Vmax): 2.43 cm AV Vmax:        159.00 cm/s AV Peak Grad:   10.1 mmHg LVOT Vmax:      123.00 cm/s LVOT Vmean:     77.600 cm/s LVOT VTI:       0.307 m AI PHT:         499 msec  AORTA Ao Root diam: 2.90 cm Ao Asc diam:  3.10 cm MITRAL VALVE               TRICUSPID VALVE MV Area (PHT): 2.91 cm    TR Peak grad:   28.5 mmHg MV Decel Time: 261 msec    TR Vmax:        267.00 cm/s MV E velocity: 61.30 cm/s MV A velocity: 90.30 cm/s  SHUNTS MV E/A ratio:  0.68        Systemic VTI:  0.31 m                            Systemic Diam: 2.00 cm Cherlynn Kaiser MD Electronically signed by Cherlynn Kaiser MD Signature Date/Time: 02/11/2022/1:23:52 PM    Final     Cardiac Studies  TTE 02/11/2022  1. Left ventricular ejection fraction, by estimation, is 60 to 65%. The  left ventricle has normal function. The left ventricle has no regional  wall motion abnormalities. There is mild left ventricular hypertrophy.  Left  ventricular diastolic parameters  are consistent with Grade I diastolic dysfunction (impaired relaxation).   2. Right ventricular systolic function is normal. The right ventricular  size is normal. There is mildly elevated pulmonary artery systolic  pressure. The estimated right ventricular systolic pressure is 16.1 mmHg.   3. Left atrial size was mildly dilated.   4. The mitral valve is grossly normal. Trivial mitral valve  regurgitation. No evidence of mitral stenosis.   5. The aortic valve is grossly normal. There is mild calcification of the  aortic valve. Aortic valve regurgitation is mild. Aortic valve  sclerosis/calcification is present, without any evidence of aortic  stenosis.   6. The inferior vena cava is normal in size with <50% respiratory  variability, suggesting right atrial pressure of 8 mmHg.   Patient Profile  Carlisle Torgeson is a 76 y.o. female with hypertension and hyperlipidemia who was admitted on 02/10/2022 with chest pain.  Assessment & Plan   #Chest pain, possible cardiac -EKG normal.  Troponin is negative.  Echo normal.  Coronary CTA today. -100 mg of metoprolol tartrate given now.  We will hopefully facilitate her scan this morning.    #HTN -Home Aldactone.  Continue home ARB.  Can add calcium channel blocker as needed.  For questions or updates, please contact [place] Please consult [email or web address] for contact info under   Signed, [place] T. Audie Box, MD, [place]  02/12/2022 7:00 AM

## 2022-02-12 NOTE — Plan of Care (Signed)
°  Problem: Education: °Goal: Knowledge of General Education information will improve °Description: Including pain rating scale, medication(s)/side effects and non-pharmacologic comfort measures °Outcome: Progressing °  °Problem: Health Behavior/Discharge Planning: °Goal: Ability to manage health-related needs will improve °Outcome: Progressing °  °Problem: Clinical Measurements: °Goal: Ability to maintain clinical measurements within normal limits will improve °Outcome: Progressing °Goal: Will remain free from infection °Outcome: Progressing °Goal: Cardiovascular complication will be avoided °Outcome: Progressing °  °Problem: Activity: °Goal: Risk for activity intolerance will decrease °Outcome: Progressing °  °Problem: Nutrition: °Goal: Adequate nutrition will be maintained °Outcome: Progressing °  °

## 2022-03-12 NOTE — Progress Notes (Unsigned)
Office Visit    Patient Name: Dawn Collins Date of Encounter: 03/13/2022  Primary Care Provider:  Jonathon Bellows, PA-C Primary Cardiologist:  Evalina Field, MD  Chief Complaint    76 year old female with a history of CAD, hypertension, hyperlipidemia, OSA, GERD, asthma and goiter who presents for hospital follow-up related to CAD and chest pain.  Past Medical History    Past Medical History:  Diagnosis Date   Arthritis    knees, back.   Asthma    2 weeks cold,no problems now-well controlled   Bursitis, knee    Bil. Hips, not knee   Complication of anesthesia    slow to wake up   Diverticulitis of colon 05-28-11   no current problems   GERD (gastroesophageal reflux disease)    tx. pantoprazole   Hypertension    Multiple thyroid nodules    Pneumonia    PONV (postoperative nausea and vomiting)    Sleep apnea    No cpap now -3 months last due to insurance reasons   Past Surgical History:  Procedure Laterality Date   ABDOMINAL HYSTERECTOMY     partial hyst   BREAST BIOPSY     several- all benign   CARDIAC CATHETERIZATION     10'11   CERVICAL [place]   FOOT SURGERY     Bilateral   KNEE ARTHROSCOPY     right '04   TOTAL KNEE ARTHROPLASTY  06/04/2011   Procedure: TOTAL KNEE ARTHROPLASTY;  Surgeon: Gearlean Alf;  Location: [facility];  Service: Orthopedics;  Laterality: Right;   TOTAL KNEE ARTHROPLASTY Left 08/08/2015   Procedure: TOTAL LEFT KNEE ARTHROPLASTY;  Surgeon: Gaynelle Arabian, MD;  Location: [facility];  Service: Orthopedics;  Laterality: Left;    Allergies  Allergies  Allergen Reactions   Morphine And Related Nausea And Vomiting   Avelox [Moxifloxacin Hcl In Nacl] Other (See Comments)    Numbness    Celebrex [Celecoxib] Other (See Comments)    Chest pain    Clarithromycin Nausea And Vomiting   Codeine Other (See Comments)    Sweating,vomiting and shortness of breath   Food Nausea And Vomiting    Seafood (all)   Hycodan  [Hydrocodone Bit-Homatrop Mbr] Other (See Comments)    dyspnea   Keflex [Cephalexin] Other (See Comments)    SICK AND WEAK, TINGLING DOWN DOWN MY FACE   Pentazocine Other (See Comments)    Chest pain   Septra [Bactrim] Nausea And Vomiting   Sertraline Hcl Other (See Comments)    tinnitus   Stadol [Butorphanol Tartrate] Nausea And Vomiting   Duraphen [Phenylephrine-Guaifenesin] Palpitations   Floxin [Ofloxacin] Rash   Penicillins Rash    Has patient had a PCN reaction causing immediate rash, facial/tongue/throat swelling, SOB or lightheadedness with hypotension: Yes Has patient had a PCN reaction causing severe rash involving mucus membranes or skin necrosis: Yes Has patient had a PCN reaction that required hospitalization No Has patient had a PCN reaction occurring within the last 10 years: No If all of the above answers are "NO", then may proceed with Cephalosporin use.     History of Present Illness    76 year old female with a history of CAD, hypertension, hyperlipidemia, OSA, GERD asthma, and goiter who presents for hospital follow-up related to CAD and chest pain.  She presented to the ED on 02/09/2022 with atypical chest pain.  Prior CT scan of the chest in 2022 showed coronary artery calcium in the left main,  LAD, and left circumflex.  EKG was unremarkable, troponin was negative.  Echocardiogram was normal.  Chest x-ray was unremarkable.  She was hospitalized from 02/09/2022 to 02/12/2022.  Cardiology was consulted.  Coronary CTA showed coronary calcium score of 535, 90th percentile, minimal to mild nonobstructive CAD (primarily in the LAD), mildly dilated main pulmonary artery, suggestive of pulmonary hypertension, aortic atherosclerosis and 1.3 cm right middle lobe pulmonary nodule noted on prior CT chest, negative FFR.  It was thought that her chest pain was noncardiac.  She was started on aspirin and statin for secondary prevention.  She was discharged home in stable condition on  02/12/2022.  She presents today for follow-up accompanied by her sister.  Since her hospitalization he has done well from a cardiac standpoint.  She denies any further chest discomfort (she thinks her symptoms may have been related to GERD as they improved with combination of Protonix and Pepcid).  She states she stopped taking atorvastatin due to itching and cold sensation.  She remains on Zetia. Otherwise, she reports feeling well and denies any additional concerns today.  Home Medications    Current Outpatient Medications  Medication Sig Dispense Refill   acetaminophen (TYLENOL) 500 MG tablet Take 500-1,000 mg by mouth every 6 (six) hours as needed for moderate pain. For arthritis     albuterol (VENTOLIN HFA) 108 (90 Base) MCG/ACT inhaler Inhale 2 puffs into the lungs every 6 (six) hours as needed for wheezing or shortness of breath.     aspirin EC 81 MG tablet Take 81 mg by mouth daily.     cephALEXin (KEFLEX) 500 MG capsule Take 1 capsule (500 mg total) by mouth 2 (two) times daily. 14 capsule 0   diltiazem (CARDIZEM CD) 360 MG 24 hr capsule Take 360 mg by mouth daily.     estradiol (ESTRACE) 2 MG tablet Take 2 mg by mouth daily.     ezetimibe (ZETIA) 10 MG tablet Take 10 mg by mouth every evening.     fluticasone (FLONASE) 50 MCG/ACT nasal spray Place 1-2 sprays into both nostrils daily as needed for allergies.     fluticasone furoate-vilanterol (BREO ELLIPTA) 100-25 MCG/INH AEPB Inhale 1 puff by mouth into the lungs daily. (Patient taking differently: Inhale 1 puff into the lungs daily as needed (sob/wheezing).) 60 each 0   guaiFENesin (MUCINEX) 600 MG 12 hr tablet Take 600 mg by mouth 2 (two) times daily as needed for cough.     lactose free nutrition (BOOST) LIQD Take 237 mLs by mouth daily.     latanoprost (XALATAN) 0.005 % ophthalmic solution Place 1 drop into both eyes at bedtime.  3   meloxicam (MOBIC) 15 MG tablet Take 15 mg by mouth daily as needed for pain.     montelukast  (SINGULAIR) 10 MG tablet Take 10 mg by mouth at bedtime.     pantoprazole (PROTONIX) 40 MG tablet Take 1 tablet (40 mg total) by mouth 2 (two) times daily. 60 tablet 0   rosuvastatin (CRESTOR) 10 MG tablet Take 1 tablet (10 mg total) by mouth daily. 90 tablet 3   spironolactone (ALDACTONE) 25 MG tablet Take 25 mg by mouth daily.     telmisartan (MICARDIS) 80 MG tablet Take 80 mg by mouth daily.     famotidine (PEPCID) 20 MG tablet Take 1 tablet (20 mg total) by mouth 2 (two) times daily. 60 tablet 1   No current facility-administered medications for this visit.     Review of Systems  She denies chest pain, palpitations, dyspnea, pnd, orthopnea, n, v, dizziness, syncope, edema, weight gain, or early satiety. All other systems reviewed and are otherwise negative except as noted above.   Physical Exam    VS:  BP 128/60   Pulse 68   Ht 5\' 5"  (1.651 m)   Wt 156 lb 9.6 oz (71 kg)   SpO2 99%   BMI 26.06 kg/m   GEN: Well nourished, well developed, in no acute distress. HEENT: normal. Neck: Supple, no JVD, carotid bruits, or masses. Cardiac: RRR, no murmurs, rubs, or gallops. No clubbing, cyanosis, edema.  Radials/DP/PT 2+ and equal bilaterally.  Respiratory:  Respirations regular and unlabored, clear to auscultation bilaterally. GI: Soft, nontender, nondistended, BS + x 4. MS: no deformity or atrophy. Skin: warm and dry, no rash. Neuro:  Strength and sensation are intact. Psych: Normal affect.  Accessory Clinical Findings    ECG personally reviewed by me today - NSR, 68 bpm, non-specific ST changes - no acute changes.   Lab Results  Component Value Date   WBC 8.4 02/12/2022   HGB 12.2 02/12/2022   HCT 35.8 (L) 02/12/2022   MCV 90.6 02/12/2022   PLT 245 02/12/2022   Lab Results  Component Value Date   CREATININE 0.87 02/12/2022   BUN 14 02/12/2022   NA 136 02/12/2022   K 4.1 02/12/2022   CL 104 02/12/2022   CO2 24 02/12/2022   Lab Results  Component Value Date   ALT  14 02/11/2022   AST 14 (L) 02/11/2022   ALKPHOS 42 02/11/2022   BILITOT 0.6 02/11/2022   Lab Results  Component Value Date   CHOL 161 02/11/2022   HDL 47 02/11/2022   LDLCALC 104 (H) 02/11/2022   TRIG 50 02/11/2022   CHOLHDL 3.4 02/11/2022    No results found for: "HGBA1C"  Assessment & Plan    1. CAD/chest pain: Coronary CTA showed coronary calcium score of 535, 90th percentile, minimal to mild nonobstructive CAD (primarily in the LAD), mildly dilated main pulmonary artery, suggestive of pulmonary hypertension, aortic atherosclerosis and 1.3 cm right middle lobe pulmonary nodule noted on prior CT chest, negative FFR. Stable with no anginal symptoms.  She thinks her previous symptoms may have been secondary to GERD.  Continue aspirin, Crestor as below.  2. Hypertension: BP well controlled. Continue current antihypertensive regimen.   3. Hyperlipidemia: LDL was 104 in 01/2022.  She did not tolerate atorvastatin (due to itching, cold sensation).  Will start Crestor 10 mg daily.  Plan for repeat lipids, LFTs in 6 to 8 weeks.  If she does not tolerate Crestor, will refer to lipid clinic Pharm.D. for consideration of PCSK9 inhibitor given h/o CAD.   4. GERD: Improved with Protonix/Pepcid.   5. Disposition: Follow-up in 1 month.       Lenna Sciara, NP 03/13/2022, 12:19 PM

## 2022-03-13 ENCOUNTER — Ambulatory Visit (INDEPENDENT_AMBULATORY_CARE_PROVIDER_SITE_OTHER): Payer: Medicare Other | Admitting: Nurse Practitioner

## 2022-03-13 ENCOUNTER — Encounter: Payer: Self-pay | Admitting: Nurse Practitioner

## 2022-03-13 VITALS — BP 128/60 | HR 68 | Ht 65.0 in | Wt 156.6 lb

## 2022-03-13 DIAGNOSIS — I10 Essential (primary) hypertension: Secondary | ICD-10-CM

## 2022-03-13 DIAGNOSIS — R0789 Other chest pain: Secondary | ICD-10-CM | POA: Diagnosis not present

## 2022-03-13 DIAGNOSIS — I2510 Atherosclerotic heart disease of native coronary artery without angina pectoris: Secondary | ICD-10-CM

## 2022-03-13 DIAGNOSIS — E785 Hyperlipidemia, unspecified: Secondary | ICD-10-CM

## 2022-03-13 DIAGNOSIS — K219 Gastro-esophageal reflux disease without esophagitis: Secondary | ICD-10-CM

## 2022-03-13 MED ORDER — ROSUVASTATIN CALCIUM 10 MG PO TABS: 10.0000 mg | ORAL_TABLET | Freq: Every day | ORAL | 3 refills | Status: DC

## 2022-03-13 NOTE — Patient Instructions (Signed)
Medication Instructions:  Stop Atorvastatin as directed. Start Crestor 10 mg daily    *If you need a refill on your cardiac medications before your next appointment, please call your pharmacy*   Lab Work: NONE ordered at this time of appointment   If you have labs (blood work) drawn today and your tests are completely normal, you will receive your results only by: [place] (if you have MyChart) OR A paper copy in the mail If you have any lab test that is abnormal or we need to change your treatment, we will call you to review the results.   Testing/Procedures: NONE ordered at this time of appointment     Follow-Up: At [facility], you and your health needs are our priority.  As part of our continuing mission to provide you with exceptional heart care, we have created designated Provider Care Teams.  These Care Teams include your primary Cardiologist (physician) and Advanced Practice Providers (APPs -  Physician Assistants and Nurse Practitioners) who all work together to provide you with the care you need, when you need it.  We recommend signing up for the patient portal called "MyChart".  Sign up information is provided on this After Visit Summary.  MyChart is used to connect with patients for Virtual Visits (Telemedicine).  Patients are able to view lab/test results, encounter notes, upcoming appointments, etc.  Non-urgent messages can be sent to your provider as well.   To learn more about what you can do with MyChart, go to [email or web address].    Your next appointment:   1 month(s)  The format for your next appointment:   In Person  Provider:   Diona Browner, NP        Other Instructions   Important Information About Sugar

## 2022-04-23 ENCOUNTER — Ambulatory Visit: Payer: Medicare Other | Attending: Nurse Practitioner | Admitting: Nurse Practitioner

## 2022-04-23 ENCOUNTER — Encounter: Payer: Self-pay | Admitting: Nurse Practitioner

## 2022-04-23 VITALS — BP 124/58 | HR 73 | Ht 65.0 in | Wt 157.6 lb

## 2022-04-23 DIAGNOSIS — I2510 Atherosclerotic heart disease of native coronary artery without angina pectoris: Secondary | ICD-10-CM | POA: Insufficient documentation

## 2022-04-23 DIAGNOSIS — K219 Gastro-esophageal reflux disease without esophagitis: Secondary | ICD-10-CM | POA: Diagnosis not present

## 2022-04-23 DIAGNOSIS — E785 Hyperlipidemia, unspecified: Secondary | ICD-10-CM | POA: Insufficient documentation

## 2022-04-23 DIAGNOSIS — I10 Essential (primary) hypertension: Secondary | ICD-10-CM | POA: Diagnosis not present

## 2022-04-23 NOTE — Progress Notes (Signed)
Office Visit    Patient Name: Dawn Collins Date of Encounter: 04/23/2022  Primary Care Provider:  Jonathon Bellows, PA-C Primary Cardiologist:  Evalina Field, MD  Chief Complaint    76 year old female with a history of CAD, hypertension, hyperlipidemia, OSA, GERD, asthma and goiter who presents for follow-up related to CAD and hyperlipidemia.  Past Medical History    Past Medical History:  Diagnosis Date   Arthritis    knees, back.   Asthma    2 weeks cold,no problems now-well controlled   Bursitis, knee    Bil. Hips, not knee   Complication of anesthesia    slow to wake up   Diverticulitis of colon 05-28-11   no current problems   GERD (gastroesophageal reflux disease)    tx. pantoprazole   Hypertension    Multiple thyroid nodules    Pneumonia    PONV (postoperative nausea and vomiting)    Sleep apnea    No cpap now -3 months last due to insurance reasons   Past Surgical History:  Procedure Laterality Date   ABDOMINAL HYSTERECTOMY     partial hyst   BREAST BIOPSY     several- all benign   CARDIAC CATHETERIZATION     10'11   CERVICAL [place]   FOOT SURGERY     Bilateral   KNEE ARTHROSCOPY     right '04   TOTAL KNEE ARTHROPLASTY  06/04/2011   Procedure: TOTAL KNEE ARTHROPLASTY;  Surgeon: Gearlean Alf;  Location: [facility];  Service: Orthopedics;  Laterality: Right;   TOTAL KNEE ARTHROPLASTY Left 08/08/2015   Procedure: TOTAL LEFT KNEE ARTHROPLASTY;  Surgeon: Gaynelle Arabian, MD;  Location: [facility];  Service: Orthopedics;  Laterality: Left;    Allergies  Allergies  Allergen Reactions   Morphine And Related Nausea And Vomiting   Avelox [Moxifloxacin Hcl In Nacl] Other (See Comments)    Numbness    Celebrex [Celecoxib] Other (See Comments)    Chest pain    Clarithromycin Nausea And Vomiting   Codeine Other (See Comments)    Sweating,vomiting and shortness of breath   Food Nausea And Vomiting    Seafood (all)   Hycodan  [Hydrocodone Bit-Homatrop Mbr] Other (See Comments)    dyspnea   Keflex [Cephalexin] Other (See Comments)    SICK AND WEAK, TINGLING DOWN DOWN MY FACE   Pentazocine Other (See Comments)    Chest pain   Septra [Bactrim] Nausea And Vomiting   Sertraline Hcl Other (See Comments)    tinnitus   Stadol [Butorphanol Tartrate] Nausea And Vomiting   Duraphen [Phenylephrine-Guaifenesin] Palpitations   Floxin [Ofloxacin] Rash   Penicillins Rash    Has patient had a PCN reaction causing immediate rash, facial/tongue/throat swelling, SOB or lightheadedness with hypotension: Yes Has patient had a PCN reaction causing severe rash involving mucus membranes or skin necrosis: Yes Has patient had a PCN reaction that required hospitalization No Has patient had a PCN reaction occurring within the last 10 years: No If all of the above answers are "NO", then may proceed with Cephalosporin use.     History of Present Illness    76 year old female with a history of CAD, hypertension, hyperlipidemia, OSA, GERD asthma, and goiter who presents for hospital follow-up related to CAD and chest pain.   She was hospitalized in July 2023 in the setting of atypical chest pain.  Prior CT scan of the chest in 2022 showed coronary artery calcium in the left  main, LAD, and left circumflex.  EKG was unremarkable, troponin was negative.  Echocardiogram was normal. Coronary CTA showed coronary calcium score of 535, 90th percentile, minimal to mild nonobstructive CAD (primarily in the LAD), mildly dilated main pulmonary artery, suggestive of pulmonary hypertension, aortic atherosclerosis and 1.3 cm right middle lobe pulmonary nodule noted on prior CT chest, negative FFR.  It was thought that her chest pain was noncardiac.  She was started on aspirin and statin for secondary prevention.    She was last seen in the office on 03/13/2022 and was stable from a cardiac standpoint.  She denied symptoms concerning for angina.  She had  stopped taking her atorvastatin due to an itching and cold sensation.  She was started on Crestor 10 mg daily. She presents today for follow-up. Since her last visit she has been stable from a cardiac standpoint.  She denies any recurrent chest pain.  She is tolerating Crestor at the current dose of 10 mg daily.  Overall, she reports feeling well and denies any new concerns today.  Home Medications    Current Outpatient Medications  Medication Sig Dispense Refill   acetaminophen (TYLENOL) 500 MG tablet Take 500-1,000 mg by mouth every 6 (six) hours as needed for moderate pain. For arthritis     albuterol (VENTOLIN HFA) 108 (90 Base) MCG/ACT inhaler Inhale 2 puffs into the lungs every 6 (six) hours as needed for wheezing or shortness of breath.     aspirin EC 81 MG tablet Take 81 mg by mouth daily.     cephALEXin (KEFLEX) 500 MG capsule Take 1 capsule (500 mg total) by mouth 2 (two) times daily. 14 capsule 0   diltiazem (CARDIZEM CD) 360 MG 24 hr capsule Take 360 mg by mouth daily.     estradiol (ESTRACE) 2 MG tablet Take 2 mg by mouth daily.     ezetimibe (ZETIA) 10 MG tablet Take 10 mg by mouth every evening.     fluticasone (FLONASE) 50 MCG/ACT nasal spray Place 1-2 sprays into both nostrils daily as needed for allergies.     fluticasone furoate-vilanterol (BREO ELLIPTA) 100-25 MCG/INH AEPB Inhale 1 puff by mouth into the lungs daily. (Patient taking differently: Inhale 1 puff into the lungs daily as needed (sob/wheezing).) 60 each 0   guaiFENesin (MUCINEX) 600 MG 12 hr tablet Take 600 mg by mouth 2 (two) times daily as needed for cough.     lactose free nutrition (BOOST) LIQD Take 237 mLs by mouth daily.     latanoprost (XALATAN) 0.005 % ophthalmic solution Place 1 drop into both eyes at bedtime.  3   meloxicam (MOBIC) 15 MG tablet Take 15 mg by mouth daily as needed for pain.     montelukast (SINGULAIR) 10 MG tablet Take 10 mg by mouth at bedtime.     pantoprazole (PROTONIX) 40 MG tablet Take  1 tablet (40 mg total) by mouth 2 (two) times daily. 60 tablet 0   rosuvastatin (CRESTOR) 10 MG tablet Take 1 tablet (10 mg total) by mouth daily. 90 tablet 3   spironolactone (ALDACTONE) 25 MG tablet Take 25 mg by mouth daily.     telmisartan (MICARDIS) 80 MG tablet Take 80 mg by mouth daily.     famotidine (PEPCID) 20 MG tablet Take 1 tablet (20 mg total) by mouth 2 (two) times daily. 60 tablet 1   No current facility-administered medications for this visit.     Review of Systems    She denies chest pain,  palpitations, dyspnea, pnd, orthopnea, n, v, dizziness, syncope, edema, weight gain, or early satiety. All other systems reviewed and are otherwise negative except as noted above.   Physical Exam    VS:  BP (!) 124/58   Pulse 73   Ht 5\' 5"  (1.651 m)   Wt 157 lb 9.6 oz (71.5 kg)   SpO2 97%   BMI 26.23 kg/m  GEN: Well nourished, well developed, in no acute distress. HEENT: normal. Neck: Supple, no JVD, carotid bruits, or masses. Cardiac: RRR, no murmurs, rubs, or gallops. No clubbing, cyanosis, edema.  Radials/DP/PT 2+ and equal bilaterally.  Respiratory:  Respirations regular and unlabored, clear to auscultation bilaterally. GI: Soft, nontender, nondistended, BS + x 4. MS: no deformity or atrophy. Skin: warm and dry, no rash. Neuro:  Strength and sensation are intact. Psych: Normal affect.  Accessory Clinical Findings    ECG personally reviewed by me today - No EKG in office today.    Lab Results  Component Value Date   WBC 8.4 02/12/2022   HGB 12.2 02/12/2022   HCT 35.8 (L) 02/12/2022   MCV 90.6 02/12/2022   PLT 245 02/12/2022   Lab Results  Component Value Date   CREATININE 0.87 02/12/2022   BUN 14 02/12/2022   NA 136 02/12/2022   K 4.1 02/12/2022   CL 104 02/12/2022   CO2 24 02/12/2022   Lab Results  Component Value Date   ALT 14 02/11/2022   AST 14 (L) 02/11/2022   ALKPHOS 42 02/11/2022   BILITOT 0.6 02/11/2022   Lab Results  Component Value Date    CHOL 161 02/11/2022   HDL 47 02/11/2022   LDLCALC 104 (H) 02/11/2022   TRIG 50 02/11/2022   CHOLHDL 3.4 02/11/2022    No results found for: "HGBA1C"  Assessment & Plan    1. CAD/chest pain: Coronary CTA showed coronary calcium score of 535, 90th percentile, minimal to mild nonobstructive CAD (primarily in the LAD), mildly dilated main pulmonary artery, suggestive of pulmonary hypertension, aortic atherosclerosis and 1.3 cm right middle lobe pulmonary nodule noted on prior CT chest, negative FFR. Stable with no anginal symptoms.  She thinks her previous symptoms may have been secondary to GERD.  Continue aspirin, Crestor.    2. Hypertension: BP well controlled. Continue current antihypertensive regimen.    3. Hyperlipidemia: LDL was 104 in 01/2022.  She did not tolerate atorvastatin (due to itching, cold sensation).  She is tolerating Crestor 10 mg daily.  Will repeat lipids, LFTs.  Labs are stable, will likely increase Crestor to 20 mg daily.  If she does not tolerate increased Crestor, will consider referral to lipid clinic Pharm.D. for consideration of PCSK9 inhibitor given h/o CAD.    4. GERD: Improved with Protonix/Pepcid.    5. Disposition: Follow-up in 3 months.      Lenna Sciara, NP 04/23/2022, 2:01 PM

## 2022-04-23 NOTE — Patient Instructions (Signed)
Medication Instructions:  Your physician recommends that you continue on your current medications as directed. Please refer to the Current Medication list given to you today.   *If you need a refill on your cardiac medications before your next appointment, please call your pharmacy*   Lab Work: Your physician recommends that you return for lab work in 1 week Fasting Lipids & LFTs If you have labs (blood work) drawn today and your tests are completely normal, you will receive your results only by: [place] (if you have MyChart) OR A paper copy in the mail If you have any lab test that is abnormal or we need to change your treatment, we will call you to review the results.   Testing/Procedures: NONE ordered at this time of appointment     Follow-Up: At [facility], you and your health needs are our priority.  As part of our continuing mission to provide you with exceptional heart care, we have created designated Provider Care Teams.  These Care Teams include your primary Cardiologist (physician) and Advanced Practice Providers (APPs -  Physician Assistants and Nurse Practitioners) who all work together to provide you with the care you need, when you need it.  We recommend signing up for the patient portal called "MyChart".  Sign up information is provided on this After Visit Summary.  MyChart is used to connect with patients for Virtual Visits (Telemedicine).  Patients are able to view lab/test results, encounter notes, upcoming appointments, etc.  Non-urgent messages can be sent to your provider as well.   To learn more about what you can do with MyChart, go to [email or web address].    Your next appointment:   3 month(s)  The format for your next appointment:   In Person  Provider:   Diona Browner, NP        Other Instructions [facility] Address: [address]. [place], [place] [address]  Important Information About Sugar

## 2022-05-01 ENCOUNTER — Telehealth: Payer: Self-pay

## 2022-05-01 DIAGNOSIS — I10 Essential (primary) hypertension: Secondary | ICD-10-CM

## 2022-05-01 DIAGNOSIS — Z79899 Other long term (current) drug therapy: Secondary | ICD-10-CM

## 2022-05-01 LAB — HEPATIC FUNCTION PANEL
ALT: 9 IU/L (ref 0–32)
AST: 11 IU/L (ref 0–40)
Albumin: 4.6 g/dL (ref 3.8–4.8)
Alkaline Phosphatase: 58 IU/L (ref 44–121)
Bilirubin Total: 0.6 mg/dL (ref 0.0–1.2)
Bilirubin, Direct: 0.2 mg/dL (ref 0.00–0.40)
Total Protein: 6.8 g/dL (ref 6.0–8.5)

## 2022-05-01 LAB — LIPID PANEL
Chol/HDL Ratio: 2 ratio (ref 0.0–4.4)
Cholesterol, Total: 108 mg/dL (ref 100–199)
HDL: 54 mg/dL (ref >39)
LDL Chol Calc (NIH): 42 mg/dL (ref 0–99)
Triglycerides: 48 mg/dL (ref 0–149)
VLDL Cholesterol Cal: 12 mg/dL (ref 5–40)

## 2022-05-01 MED ORDER — ROSUVASTATIN CALCIUM 20 MG PO TABS: 20.0000 mg | ORAL_TABLET | Freq: Every day | ORAL | 3 refills | Status: DC

## 2022-05-01 NOTE — Telephone Encounter (Signed)
Spoke with pt. Pt was notified of lab results and recommendations. Crestor 20 mg daily was sent into pts pharmacy. Fasting lipid panel & LFTs orders placed.

## 2022-06-27 ENCOUNTER — Telehealth: Payer: Self-pay

## 2022-06-27 NOTE — Telephone Encounter (Signed)
Lmom, waiting on a return call. Reminder, pt needs to complete blood work (CBC & Fasting Lipids) at their earliest convenience s/p last ov.

## 2022-07-30 ENCOUNTER — Ambulatory Visit: Payer: Medicare Other | Attending: Nurse Practitioner | Admitting: Nurse Practitioner

## 2022-07-30 ENCOUNTER — Encounter: Payer: Self-pay | Admitting: Nurse Practitioner

## 2022-07-30 VITALS — BP 138/68 | HR 76 | Ht 65.0 in | Wt 158.2 lb

## 2022-07-30 DIAGNOSIS — C3491 Malignant neoplasm of unspecified part of right bronchus or lung: Secondary | ICD-10-CM | POA: Diagnosis present

## 2022-07-30 DIAGNOSIS — E785 Hyperlipidemia, unspecified: Secondary | ICD-10-CM | POA: Diagnosis present

## 2022-07-30 DIAGNOSIS — I10 Essential (primary) hypertension: Secondary | ICD-10-CM | POA: Insufficient documentation

## 2022-07-30 DIAGNOSIS — K219 Gastro-esophageal reflux disease without esophagitis: Secondary | ICD-10-CM | POA: Diagnosis present

## 2022-07-30 DIAGNOSIS — I2510 Atherosclerotic heart disease of native coronary artery without angina pectoris: Secondary | ICD-10-CM | POA: Diagnosis not present

## 2022-07-30 NOTE — Patient Instructions (Signed)
Medication Instructions:  Your physician recommends that you continue on your current medications as directed. Please refer to the Current Medication list given to you today.   *If you need a refill on your cardiac medications before your next appointment, please call your pharmacy*   Lab Work: NONE ordered at this time of appointment   If you have labs (blood work) drawn today and your tests are completely normal, you will receive your results only by: [place] (if you have MyChart) OR A paper copy in the mail If you have any lab test that is abnormal or we need to change your treatment, we will call you to review the results.   Testing/Procedures: NONE ordered at this time of appointment     Follow-Up: At [facility], you and your health needs are our priority.  As part of our continuing mission to provide you with exceptional heart care, we have created designated Provider Care Teams.  These Care Teams include your primary Cardiologist (physician) and Advanced Practice Providers (APPs -  Physician Assistants and Nurse Practitioners) who all work together to provide you with the care you need, when you need it.  We recommend signing up for the patient portal called "MyChart".  Sign up information is provided on this After Visit Summary.  MyChart is used to connect with patients for Virtual Visits (Telemedicine).  Patients are able to view lab/test results, encounter notes, upcoming appointments, etc.  Non-urgent messages can be sent to your provider as well.   To learn more about what you can do with MyChart, go to [email or web address].    Your next appointment:   6 month(s)  The format for your next appointment:   In Person  Provider:   Evalina Field, MD     Other Instructions   Important Information About Sugar

## 2022-07-30 NOTE — Progress Notes (Signed)
Office Visit    Patient Name: Dawn Collins Date of Encounter: 07/30/2022  Primary Care Provider:  Jonathon Bellows, PA-C Primary Cardiologist:  Evalina Field, MD  Chief Complaint    77 year old female with a history of CAD, hypertension, hyperlipidemia, OSA, GERD, lung cancer, asthma and goiter who presents for follow-up related to CAD and hyperlipidemia.   Past Medical History    Past Medical History:  Diagnosis Date   Arthritis    knees, back.   Asthma    2 weeks cold,no problems now-well controlled   Bursitis, knee    Bil. Hips, not knee   Complication of anesthesia    slow to wake up   Diverticulitis of colon 05-28-11   no current problems   GERD (gastroesophageal reflux disease)    tx. pantoprazole   Hypertension    Multiple thyroid nodules    Pneumonia    PONV (postoperative nausea and vomiting)    Sleep apnea    No cpap now -3 months last due to insurance reasons   Past Surgical History:  Procedure Laterality Date   ABDOMINAL HYSTERECTOMY     partial hyst   BREAST BIOPSY     several- all benign   CARDIAC CATHETERIZATION     10'11   CERVICAL [place]   FOOT SURGERY     Bilateral   KNEE ARTHROSCOPY     right '04   TOTAL KNEE ARTHROPLASTY  06/04/2011   Procedure: TOTAL KNEE ARTHROPLASTY;  Surgeon: Gearlean Alf;  Location: [facility];  Service: Orthopedics;  Laterality: Right;   TOTAL KNEE ARTHROPLASTY Left 08/08/2015   Procedure: TOTAL LEFT KNEE ARTHROPLASTY;  Surgeon: Gaynelle Arabian, MD;  Location: [facility];  Service: Orthopedics;  Laterality: Left;    Allergies  Allergies  Allergen Reactions   Morphine And Related Nausea And Vomiting   Avelox [Moxifloxacin Hcl In Nacl] Other (See Comments)    Numbness    Celebrex [Celecoxib] Other (See Comments)    Chest pain    Clarithromycin Nausea And Vomiting   Codeine Other (See Comments)    Sweating,vomiting and shortness of breath   Food Nausea And Vomiting    Seafood (all)    Hycodan [Hydrocodone Bit-Homatrop Mbr] Other (See Comments)    dyspnea   Keflex [Cephalexin] Other (See Comments)    SICK AND WEAK, TINGLING DOWN DOWN MY FACE   Pentazocine Other (See Comments)    Chest pain   Septra [Bactrim] Nausea And Vomiting   Sertraline Hcl Other (See Comments)    tinnitus   Stadol [Butorphanol Tartrate] Nausea And Vomiting   Duraphen [Phenylephrine-Guaifenesin] Palpitations   Floxin [Ofloxacin] Rash   Penicillins Rash    Has patient had a PCN reaction causing immediate rash, facial/tongue/throat swelling, SOB or lightheadedness with hypotension: Yes Has patient had a PCN reaction causing severe rash involving mucus membranes or skin necrosis: Yes Has patient had a PCN reaction that required hospitalization No Has patient had a PCN reaction occurring within the last 10 years: No If all of the above answers are "NO", then may proceed with Cephalosporin use.      Labs/Other Studies Reviewed    The following studies were reviewed today: CT Coronary FFR 02/12/2022: EXAM: CT FFR ANALYSIS   CLINICAL DATA:  chest pain   FINDINGS: FFRct analysis was performed on the original cardiac CT angiogram dataset. Diagrammatic representation of the FFRct analysis is provided in a separate PDF document in PACS. This dictation was  created using the PDF document and an interactive 3D model of the results. 3D model is not available in the EMR/PACS. Normal FFR range is >0.80.   1. Left Main:  No significant stenosis. FFR = 0.99   2. LAD: No significant stenosis. Proximal FFR = 0.96, Mid FFR = 0.88, Distal FFR = 0.79 3. LCX: No significant stenosis. Proximal FFR = 0.98, Mid FFR = 0.94, Distal FFR = 0.84 4. RCA (non-dominant): No significant stenosis. Proximal FFR = 0.96   IMPRESSION: 1.  CT FFR analysis did not show any significant stenosis.  Coronary CTA 02/12/2022: IMPRESSION: 1. Minimal to mild non-obstructive CAD, CADRADS = 2. CT FFR will be performed and  reported separately.   2. Coronary calcium score of 535. This was 90th percentile for age and sex matched control.   3. Normal coronary origin with left dominance.   4. Mildly dilated main pulmonary artery to 28 mm, suggestive of pulmonary hypertension.   5. Aortic atherosclerosis. Aortic valve and aortic/mitral annular calcification.   Echocardiogram 02/11/2022: IMPRESSIONS:   1. Left ventricular ejection fraction, by estimation, is 60 to 65%. The  left ventricle has normal function. The left ventricle has no regional  wall motion abnormalities. There is mild left ventricular hypertrophy.  Left ventricular diastolic parameters  are consistent with Grade I diastolic dysfunction (impaired relaxation).   2. Right ventricular systolic function is normal. The right ventricular  size is normal. There is mildly elevated pulmonary artery systolic  pressure. The estimated right ventricular systolic pressure is 84.5 mmHg.   3. Left atrial size was mildly dilated.   4. The mitral valve is grossly normal. Trivial mitral valve  regurgitation. No evidence of mitral stenosis.   5. The aortic valve is grossly normal. There is mild calcification of the  aortic valve. Aortic valve regurgitation is mild. Aortic valve  sclerosis/calcification is present, without any evidence of aortic  stenosis.   6. The inferior vena cava is normal in size with <50% respiratory  variability, suggesting right atrial pressure of 8 mmHg.  Recent Labs: 02/11/2022: TSH 0.940 02/12/2022: BUN 14; Creatinine, Ser 0.87; Hemoglobin 12.2; Magnesium 1.9; Platelets 245; Potassium 4.1; Sodium 136 04/30/2022: ALT 9  Recent Lipid Panel    Component Value Date/Time   CHOL 108 04/30/2022 0941   TRIG 48 04/30/2022 0941   HDL 54 04/30/2022 0941   CHOLHDL 2.0 04/30/2022 0941   CHOLHDL 3.4 02/11/2022 0426   VLDL 10 02/11/2022 0426   LDLCALC 42 04/30/2022 0941    History of Present Illness    76 year old female with a history  of CAD, hypertension, hyperlipidemia, OSA, GERD asthma, lung cancer and goiter.  She was hospitalized in July 2023 in the setting of atypical chest pain.  Prior CT scan of the chest in 2022 showed coronary artery calcium in the left main, LAD, and left circumflex.  EKG was unremarkable, troponin was negative. Echocardiogram was normal. Coronary CTA showed coronary calcium score of 535, 90th percentile, minimal to mild nonobstructive CAD (primarily in the LAD), mildly dilated main pulmonary artery, suggestive of pulmonary hypertension, aortic atherosclerosis and 1.3 cm right middle lobe pulmonary nodule noted on prior CT chest, negative FFR.  It was thought that her chest pain was noncardiac. She was started on aspirin and statin for secondary prevention.  She did not tolerate atorvastatin due to an itching and cold sensation. She was transitioned to Crestor. She was last seen in the office on 04/23/2022 and was stable from a cardiac standpoint.  She denied any recurrent chest pain and reported tolerating Crestor.   She presents today for follow-up. Since her last visit she has been stable from a cardiac standpoint. She denies any symptoms concerning for angina.  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 She has noticed some fatigue.  She is following with pulmonology and oncology. Otherwise, she reports feeling well.   Home Medications    Current Outpatient Medications  Medication Sig Dispense Refill   acetaminophen (TYLENOL) 500 MG tablet Take 500-1,000 mg by mouth every 6 (six) hours as needed for moderate pain. For arthritis     albuterol (VENTOLIN HFA) 108 (90 Base) MCG/ACT inhaler Inhale 2 puffs into the lungs every 6 (six) hours as needed for wheezing or shortness of breath.     aspirin EC 81 MG tablet Take 81 mg by mouth daily.     cephALEXin (KEFLEX) 500 MG capsule Take 1 capsule  (500 mg total) by mouth 2 (two) times daily. 14 capsule 0   diltiazem (CARDIZEM CD) 360 MG 24 hr capsule Take 360 mg by mouth daily.     estradiol (ESTRACE) 2 MG tablet Take 2 mg by mouth daily.     ezetimibe (ZETIA) 10 MG tablet Take 10 mg by mouth every evening.     fluticasone (FLONASE) 50 MCG/ACT nasal spray Place 1-2 sprays into both nostrils daily as needed for allergies.     fluticasone furoate-vilanterol (BREO ELLIPTA) 100-25 MCG/INH AEPB Inhale 1 puff by mouth into the lungs daily. (Patient taking differently: Inhale 1 puff into the lungs daily as needed (sob/wheezing).) 60 each 0   guaiFENesin (MUCINEX) 600 MG 12 hr tablet Take 600 mg by mouth 2 (two) times daily as needed for cough.     lactose free nutrition (BOOST) LIQD Take 237 mLs by mouth daily.     latanoprost (XALATAN) 0.005 % ophthalmic solution Place 1 drop into both eyes at bedtime.  3   magnesium oxide (MAG-OX) 400 (240 Mg) MG tablet Take 1 tablet by mouth daily.     meloxicam (MOBIC) 15 MG tablet Take 15 mg by mouth daily as needed for pain.     montelukast (SINGULAIR) 10 MG tablet Take 10 mg by mouth at bedtime.     pantoprazole (PROTONIX) 40 MG tablet Take 1 tablet (40 mg total) by mouth 2 (two) times daily. 60 tablet 0   rosuvastatin (CRESTOR) 20 MG tablet Take 1 tablet (20 mg total) by mouth daily. 90 tablet 3   spironolactone (ALDACTONE) 25 MG tablet Take 25 mg by mouth daily.     telmisartan (MICARDIS) 80 MG tablet Take 80 mg by mouth daily.     famotidine (PEPCID) 20 MG tablet Take 1 tablet (20 mg total) by mouth 2 (two) times daily. 60 tablet 1   No current facility-administered medications for this visit.     Review of Systems    She denies chest pain, palpitations, dyspnea, pnd, orthopnea, n, v, dizziness, syncope, edema, weight gain, or early satiety. All other systems reviewed and are otherwise negative except as noted above.   Physical Exam    VS:  BP 138/68   Pulse 76   Ht 5\' 5"  (1.651 m)   Wt 158 lb  3.2 oz (71.8 kg)   SpO2 99%   BMI 26.33 kg/m   GEN: Well nourished, well developed, in no acute distress. HEENT: normal. Neck:  Supple, no JVD, carotid bruits, or masses. Cardiac: RRR, no murmurs, rubs, or gallops. No clubbing, cyanosis, edema.  Radials/DP/PT 2+ and equal bilaterally.  Respiratory:  Respirations regular and unlabored, clear to auscultation bilaterally. GI: Soft, nontender, nondistended, BS + x 4. MS: no deformity or atrophy. Skin: warm and dry, no rash. Neuro:  Strength and sensation are intact. Psych: Normal affect.  Accessory Clinical Findings    ECG personally reviewed by me today - no EKG in office today.    Lab Results  Component Value Date   WBC 8.4 02/12/2022   HGB 12.2 02/12/2022   HCT 35.8 (L) 02/12/2022   MCV 90.6 02/12/2022   PLT 245 02/12/2022   Lab Results  Component Value Date   CREATININE 0.87 02/12/2022   BUN 14 02/12/2022   NA 136 02/12/2022   K 4.1 02/12/2022   CL 104 02/12/2022   CO2 24 02/12/2022   Lab Results  Component Value Date   ALT 9 04/30/2022   AST 11 04/30/2022   ALKPHOS 58 04/30/2022   BILITOT 0.6 04/30/2022   Lab Results  Component Value Date   CHOL 108 04/30/2022   HDL 54 04/30/2022   LDLCALC 42 04/30/2022   TRIG 48 04/30/2022   CHOLHDL 2.0 04/30/2022    No results found for: "HGBA1C"  Assessment & Plan    1. CAD/chest pain: Coronary CTA showed coronary calcium score of 535, 90th percentile, minimal to mild nonobstructive CAD (primarily in the LAD), mildly dilated main pulmonary artery, suggestive of pulmonary hypertension, aortic atherosclerosis and 1.3 cm right middle lobe pulmonary nodule noted on prior CT chest, negative FFR. Stable with no anginal symptoms. Continue aspirin, Crestor, Zetia.    2. Hypertension: BP well controlled. Continue current antihypertensive regimen.    3. Hyperlipidemia: LDL was 46 in 06/2022. Continue Crestor, Zetia.    4. GERD: Improved with Protonix/Pepcid.   5. Lung cancer:   S/p recent radiation therapy. Following with pulmonology and oncology.    6. Disposition: Follow-up in 6 months with Dr. Audie Box.      Lenna Sciara, NP 07/30/2022, 1:30 PM

## 2022-08-29 ENCOUNTER — Emergency Department (HOSPITAL_BASED_OUTPATIENT_CLINIC_OR_DEPARTMENT_OTHER)
Admission: EM | Admit: 2022-08-29 | Discharge: 2022-08-29 | Disposition: A | Discharge status: Home / Self Care | Payer: Medicare Other | Attending: Emergency Medicine | Admitting: Emergency Medicine

## 2022-08-29 ENCOUNTER — Emergency Department (HOSPITAL_BASED_OUTPATIENT_CLINIC_OR_DEPARTMENT_OTHER): Payer: Medicare Other

## 2022-08-29 ENCOUNTER — Other Ambulatory Visit: Payer: Self-pay

## 2022-08-29 DIAGNOSIS — Z79899 Other long term (current) drug therapy: Secondary | ICD-10-CM | POA: Diagnosis not present

## 2022-08-29 DIAGNOSIS — R0789 Other chest pain: Secondary | ICD-10-CM | POA: Insufficient documentation

## 2022-08-29 DIAGNOSIS — Z7982 Long term (current) use of aspirin: Secondary | ICD-10-CM | POA: Diagnosis not present

## 2022-08-29 DIAGNOSIS — I2510 Atherosclerotic heart disease of native coronary artery without angina pectoris: Secondary | ICD-10-CM | POA: Insufficient documentation

## 2022-08-29 DIAGNOSIS — I10 Essential (primary) hypertension: Secondary | ICD-10-CM | POA: Diagnosis not present

## 2022-08-29 DIAGNOSIS — Z85118 Personal history of other malignant neoplasm of bronchus and lung: Secondary | ICD-10-CM | POA: Insufficient documentation

## 2022-08-29 DIAGNOSIS — R079 Chest pain, unspecified: Secondary | ICD-10-CM

## 2022-08-29 LAB — CBC
HCT: 36.7 % (ref 36.0–46.0)
Hemoglobin: 12.5 g/dL (ref 12.0–15.0)
MCH: 31.1 pg (ref 26.0–34.0)
MCHC: 34.1 g/dL (ref 30.0–36.0)
MCV: 91.3 fL (ref 80.0–100.0)
Platelets: 248 10*3/uL (ref 150–400)
RBC: 4.02 MIL/uL (ref 3.87–5.11)
RDW: 11.9 % (ref 11.5–15.5)
WBC: 6.6 10*3/uL (ref 4.0–10.5)
nRBC: 0 % (ref 0.0–0.2)

## 2022-08-29 LAB — TROPONIN I (HIGH SENSITIVITY)
Troponin I (High Sensitivity): 4 ng/L (ref <18)
Troponin I (High Sensitivity): 3 ng/L (ref <18)

## 2022-08-29 LAB — BASIC METABOLIC PANEL
Anion gap: 8 (ref 5–15)
BUN: 11 mg/dL (ref 8–23)
CO2: 26 mmol/L (ref 22–32)
Calcium: 9.1 mg/dL (ref 8.9–10.3)
Chloride: 100 mmol/L (ref 98–111)
Creatinine, Ser: 0.99 mg/dL (ref 0.44–1.00)
GFR, Estimated: 58 mL/min — ABNORMAL LOW (ref >60)
Glucose, Bld: 98 mg/dL (ref 70–99)
Potassium: 3.9 mmol/L (ref 3.5–5.1)
Sodium: 134 mmol/L — ABNORMAL LOW (ref 135–145)

## 2022-08-29 MED ORDER — LIDOCAINE VISCOUS HCL 2 % MT SOLN
15.0000 mL | Freq: Once | OROMUCOSAL | Status: AC
  Administered 2022-08-29: 15 mL via ORAL
  Filled 2022-08-29: qty 15

## 2022-08-29 MED ORDER — ALUM & MAG HYDROXIDE-SIMETH 200-200-20 MG/5ML PO SUSP
30.0000 mL | Freq: Once | ORAL | Status: AC
  Administered 2022-08-29: 30 mL via ORAL
  Filled 2022-08-29: qty 30

## 2022-08-29 NOTE — ED Provider Notes (Signed)
[facility] [facility] Provider Note   CSN: [id number] Arrival date & time: 08/29/22  1056     History  Chief Complaint  Patient presents with   Chest Pain    Dawn Collins is a 77 y.o. female with a history of reflux, coronary disease, hypertension, hyperlipidemia lung cancer s/p radiation treatment, presented to ED with chest pressure.  Patient reports onset of chest pressure about 3 days ago.  Discomfort in the epigastrium, feels like heaviness, comes and goes, not associated with activity, eating, but she does tend to notice it early in the morning when she wakes up.  She has taken her GI medication including omeprazole and Pepcid this morning.  She currently has moderate intensity symptoms.  She denies shortness of breath, diaphoresis.  Patient was seen by cardiology office exactly 1 month ago, she was noted on prior CT scans of the chest to have coronary artery calcium in the left main LAD and left circumflex artery, 90th percentile calcium score of 535, minimal to mild obstructive CAD primarily in the LAD.  (CT on 02/12/22)  The patient was  diagnosed with a neoplasm of the right middle lung and says she completed radiation treatment 1 month ago for this.  HPI     Home Medications Prior to Admission medications   Medication Sig Start Date End Date Taking? Authorizing Provider  acetaminophen (TYLENOL) 500 MG tablet Take 500-1,000 mg by mouth every 6 (six) hours as needed for moderate pain. For arthritis    [provider]  albuterol (VENTOLIN HFA) 108 (90 Base) MCG/ACT inhaler Inhale 2 puffs into the lungs every 6 (six) hours as needed for wheezing or shortness of breath.    [provider]  aspirin EC 81 MG tablet Take 81 mg by mouth daily.    [provider]  cephALEXin (KEFLEX) 500 MG capsule Take 1 capsule (500 mg total) by mouth 2 (two) times daily. 02/12/22   Patrecia Pour, MD  diltiazem (CARDIZEM CD) 360 MG 24  hr capsule Take 360 mg by mouth daily. 01/22/22   [provider]  estradiol (ESTRACE) 2 MG tablet Take 2 mg by mouth daily.    [provider]  ezetimibe (ZETIA) 10 MG tablet Take 10 mg by mouth every evening. 01/25/22   [provider]  famotidine (PEPCID) 20 MG tablet Take 1 tablet (20 mg total) by mouth 2 (two) times daily. 09/06/20 10/06/20  Wyvonnia Dusky, MD  fluticasone (FLONASE) 50 MCG/ACT nasal spray Place 1-2 sprays into both nostrils daily as needed for allergies. 07/17/17   [provider]  fluticasone furoate-vilanterol (BREO ELLIPTA) 100-25 MCG/INH AEPB Inhale 1 puff by mouth into the lungs daily. Patient taking differently: Inhale 1 puff into the lungs daily as needed (sob/wheezing). 02/27/21   Blanchie Dessert, MD  guaiFENesin (MUCINEX) 600 MG 12 hr tablet Take 600 mg by mouth 2 (two) times daily as needed for cough. 10/22/18   [provider]  lactose free nutrition (BOOST) LIQD Take 237 mLs by mouth daily.    [provider]  latanoprost (XALATAN) 0.005 % ophthalmic solution Place 1 drop into both eyes at bedtime. 06/20/15   [provider]  magnesium oxide (MAG-OX) 400 (240 Mg) MG tablet Take 1 tablet by mouth daily. 07/05/22   [provider]  meloxicam (MOBIC) 15 MG tablet Take 15 mg by mouth daily as needed for pain. 11/27/21   [provider]  montelukast (SINGULAIR) 10 MG  tablet Take 10 mg by mouth at bedtime.    [provider]  pantoprazole (PROTONIX) 40 MG tablet Take 1 tablet (40 mg total) by mouth 2 (two) times daily. 02/12/22   Patrecia Pour, MD  rosuvastatin (CRESTOR) 20 MG tablet Take 1 tablet (20 mg total) by mouth daily. 05/01/22   Lenna Sciara, NP  spironolactone (ALDACTONE) 25 MG tablet Take 25 mg by mouth daily. 01/18/22   [provider]  telmisartan (MICARDIS) 80 MG tablet Take 80 mg by mouth daily. 01/29/22   [provider]      Allergies    Morphine and  related, Avelox [moxifloxacin hcl in nacl], Celebrex [celecoxib], Clarithromycin, Codeine, Food, Hycodan [hydrocodone bit-homatrop mbr], Keflex [cephalexin], Pentazocine, Septra [bactrim], Sertraline hcl, Stadol [butorphanol tartrate], Duraphen [phenylephrine-guaifenesin], Floxin [ofloxacin], and Penicillins    Review of Systems   Review of Systems  Physical Exam Updated Vital Signs BP (!) 116/49   Pulse (!) 56   Temp 98.2 F (36.8 C) (Oral)   Resp 16   Ht 5\' 5"  (1.651 m)   Wt 71.7 kg   SpO2 99%   BMI 26.29 kg/m  Physical Exam Constitutional:      General: She is not in acute distress. HENT:     Head: Normocephalic and atraumatic.  Eyes:     Conjunctiva/sclera: Conjunctivae normal.     Pupils: Pupils are equal, round, and reactive to light.  Cardiovascular:     Rate and Rhythm: Normal rate and regular rhythm.  Pulmonary:     Effort: Pulmonary effort is normal. No respiratory distress.  Abdominal:     General: There is no distension.     Tenderness: There is no abdominal tenderness.  Skin:    General: Skin is warm and dry.  Neurological:     General: No focal deficit present.     Mental Status: She is alert. Mental status is at baseline.  Psychiatric:        Mood and Affect: Mood normal.        Behavior: Behavior normal.     ED Results / Procedures / Treatments   Labs (all labs ordered are listed, but only abnormal results are displayed) Labs Reviewed  BASIC METABOLIC PANEL - Abnormal; Notable for the following components:      Result Value   Sodium 134 (*)    GFR, Estimated 58 (*)    All other components within normal limits  CBC  TROPONIN I (HIGH SENSITIVITY)  TROPONIN I (HIGH SENSITIVITY)    EKG EKG Interpretation  Date/Time:  Wednesday August 29 2022 11:08:14 EST Ventricular Rate:  76 PR Interval:  139 QRS Duration: 97 QT Interval:  369 QTC Calculation: 415 R Axis:   -25 Text Interpretation: Sinus rhythm Borderline left axis deviation Confirmed  by Octaviano Glow [phone]) on 08/29/2022 11:30:59 AM  Radiology DG Chest 2 View  Result Date: 08/29/2022 CLINICAL DATA:  Chest pain radiating to left arm and neck for 3 days. Dizziness. Nausea. EXAM: CHEST - 2 VIEW COMPARISON:  02/09/2022 FINDINGS: The heart size and mediastinal contours are within normal limits. Both lungs are clear. Thoracic spondylosis again noted. IMPRESSION: No active cardiopulmonary disease. Electronically Signed   By: Marlaine Hind M.D.   On: 08/29/2022 11:59    Procedures Procedures    Medications Ordered in ED Medications  alum & mag hydroxide-simeth (MAALOX/MYLANTA) 200-200-20 MG/5ML suspension 30 mL (30 mLs Oral Given 08/29/22 1205)    And  lidocaine (XYLOCAINE) 2 % viscous mouth  solution 15 mL (15 mLs Oral Given 08/29/22 1205)    ED Course/ Medical Decision Making/ A&P Clinical Course as of 08/29/22 1458  Wed Aug 29, 2022  1449 Chest discomfort was relieved with GI cocktail.  Her sister is now present at the bedside.  I suspect this may be related to reflux.  I find out that she also has a hiatal hernia.  She is also snacking before bedtime.  I recommended she try to avoid any food intake 3 hours before bedtime.  She can also add on over-the-counter acid medication as she is ready on therapeutic doses of the PPI and famotidine.  She verbalized understanding.  Okay for discharge [MT]    Clinical Course User Index [MT] Shalay Carder, Carola Rhine, MD                             Medical Decision Making Amount and/or Complexity of Data Reviewed Labs: ordered. Radiology: ordered.  Risk OTC drugs. Prescription drug management.   This patient presents to the Emergency Department with complaint of chest pain. This involves an extensive number of treatment options, and is a complaint that carries with it a high risk of complications and morbidity, given the patient's comorbidity, including hypertension, hyperlipidemia, coronary disease.The differential diagnosis includes ACS  vs Pneumothorax vs Reflux/Gastritis vs MSK pain vs Pneumonia vs other.  The description of her discomfort being primarily epigastric and worse in the morning does raise suspicion for reflux gastritis.  We will try some oral Maalox here.  I felt PE was less likely given that patient has no tachycardia, no hypoxia, her symptoms have been intermittent for the past 3 days, which is not consistent with pulmonary embolism.  I ordered, reviewed, and interpreted labs.  Pertinent results include delta troponin is negative.  BMP and CBC unremarkable I ordered medication GI medication for suspected reflux gastritis I ordered imaging studies which included x-ray of the chest I independently visualized and interpreted imaging which showed no acute abnormality and the monitor tracing which showed sinus rhythm. I agree with the radiologist interpretation External records obtained and reviewed showing coronary CT scan and cardiology outpatient office visit I personally reviewed the patients ECG which showed sinus rhythm with no acute ischemic findings  After the interventions stated above, I reevaluated the patient and found that they were symptomatically improved  Based on the patient's clinical exam, vital signs, risk factors, and ED testing, I felt that the patient's overall risk of life-threatening emergency such as ACS, PE, sepsis, or infection was low.  At this time, I felt the patient's presentation was most clinically consistent with reflux gastritis, but explained to the patient that this evaluation was not a definitive diagnostic workup.  I discussed outpatient follow up with primary care provider, and provided specialist office number on the patient's discharge paper if a referral was deemed necessary.  Return precautions were discussed with the patient.  I felt the patient was clinically stable for discharge.         Final Clinical Impression(s) / ED Diagnoses Final diagnoses:  Chest pain,  unspecified type    Rx / DC Orders ED Discharge Orders     None         Tyner Codner, Carola Rhine, MD 08/29/22 [phone]

## 2022-08-29 NOTE — ED Triage Notes (Signed)
Patient presents to ED via POV from home. Here with central chest pain that she describes as a heaviness. Patient reports pain will radiate into left arm and into neck. Endorses dizziness and nausea as well. Denies shortness of breath. Symptoms have been present for 3 days. Lung cancer patient.

## 2022-08-29 NOTE — ED Notes (Signed)
Patient transported to X-ray 

## 2022-08-29 NOTE — Discharge Instructions (Addendum)
You were diagnosed with chest pain today.  This is a non-specific diagnosis, but your provider did not feel this was a life-threatening condition at this time.  Chest pain is a common presenting condition in the Emergency Department, and it is not uncommon for patients to leave without a specific diagnosis.  It is important to remember that your workup today was not a complete medical workup.  You may still have a serious medical attention that needs follow up care with a specialist. If your provider referred you to see a specialist, it is VERY important that you call to set up an appointment with them.    It is also important that you speak to your primary care provider in 1-2 days after this visit to the ER.  Your PCP may want to see you in the office, or else they may want you to follow up with the specialist.  [place] IF:  You have severe chest pain, especially if the pain is crushing or pressure-like and spreads to the arms, back, neck, or jaw, or if you have sweating, nausea (feeling sick to your stomach), or shortness of breath. THIS IS AN EMERGENCY. Don't wait to see if the pain will go away. Get medical help at once. Call 911 or [phone] (operator). DO NOT drive yourself to the hospital.   Your chest pain gets worse and does not go away with rest.  You have an attack of chest pain lasting longer than usual, despite rest and treatment with the medications your caregiver has prescribed.  You wake from sleep with chest pain or shortness of breath.  You feel dizzy or faint.  You have chest pain not typical of your usual pain for which you originally saw your caregiver.

## 2022-08-29 NOTE — ED Notes (Signed)
ED Provider at bedside. 

## 2022-08-29 NOTE — ED Notes (Signed)
Pt verbalized understanding of discharge instructions. Opportunity for questions provided.  

## 2022-09-19 ENCOUNTER — Other Ambulatory Visit: Payer: Self-pay

## 2022-09-19 ENCOUNTER — Emergency Department (HOSPITAL_COMMUNITY)
Admission: EM | Admit: 2022-09-19 | Discharge: 2022-09-20 | Disposition: A | Discharge status: Home / Self Care | Payer: Medicare Other | Attending: Emergency Medicine | Admitting: Emergency Medicine

## 2022-09-19 ENCOUNTER — Emergency Department (HOSPITAL_COMMUNITY): Payer: Medicare Other

## 2022-09-19 ENCOUNTER — Encounter (HOSPITAL_COMMUNITY): Payer: Self-pay | Admitting: Emergency Medicine

## 2022-09-19 DIAGNOSIS — J181 Lobar pneumonia, unspecified organism: Secondary | ICD-10-CM | POA: Diagnosis not present

## 2022-09-19 DIAGNOSIS — I10 Essential (primary) hypertension: Secondary | ICD-10-CM | POA: Diagnosis not present

## 2022-09-19 DIAGNOSIS — Z7951 Long term (current) use of inhaled steroids: Secondary | ICD-10-CM | POA: Diagnosis not present

## 2022-09-19 DIAGNOSIS — Z79899 Other long term (current) drug therapy: Secondary | ICD-10-CM | POA: Insufficient documentation

## 2022-09-19 DIAGNOSIS — J45909 Unspecified asthma, uncomplicated: Secondary | ICD-10-CM | POA: Diagnosis not present

## 2022-09-19 DIAGNOSIS — Z1152 Encounter for screening for COVID-19: Secondary | ICD-10-CM | POA: Insufficient documentation

## 2022-09-19 DIAGNOSIS — Z7982 Long term (current) use of aspirin: Secondary | ICD-10-CM | POA: Insufficient documentation

## 2022-09-19 DIAGNOSIS — J189 Pneumonia, unspecified organism: Secondary | ICD-10-CM

## 2022-09-19 DIAGNOSIS — R079 Chest pain, unspecified: Secondary | ICD-10-CM | POA: Diagnosis present

## 2022-09-19 LAB — CBC WITH DIFFERENTIAL/PLATELET
Abs Immature Granulocytes: 0.07 10*3/uL (ref 0.00–0.07)
Basophils Absolute: 0 10*3/uL (ref 0.0–0.1)
Basophils Relative: 0 %
Eosinophils Absolute: 0 10*3/uL (ref 0.0–0.5)
Eosinophils Relative: 0 %
HCT: 36.6 % (ref 36.0–46.0)
Hemoglobin: 12.1 g/dL (ref 12.0–15.0)
Immature Granulocytes: 1 %
Lymphocytes Relative: 5 %
Lymphs Abs: 0.7 10*3/uL (ref 0.7–4.0)
MCH: 30.9 pg (ref 26.0–34.0)
MCHC: 33.1 g/dL (ref 30.0–36.0)
MCV: 93.6 fL (ref 80.0–100.0)
Monocytes Absolute: 0.3 10*3/uL (ref 0.1–1.0)
Monocytes Relative: 2 %
Neutro Abs: 11.7 10*3/uL — ABNORMAL HIGH (ref 1.7–7.7)
Neutrophils Relative %: 92 %
Platelets: 344 10*3/uL (ref 150–400)
RBC: 3.91 MIL/uL (ref 3.87–5.11)
RDW: 11.9 % (ref 11.5–15.5)
WBC: 12.7 10*3/uL — ABNORMAL HIGH (ref 4.0–10.5)
nRBC: 0 % (ref 0.0–0.2)

## 2022-09-19 LAB — COMPREHENSIVE METABOLIC PANEL
ALT: 22 U/L (ref 0–44)
AST: 35 U/L (ref 15–41)
Albumin: 3.5 g/dL (ref 3.5–5.0)
Alkaline Phosphatase: 43 U/L (ref 38–126)
Anion gap: 10 (ref 5–15)
BUN: 22 mg/dL (ref 8–23)
CO2: 22 mmol/L (ref 22–32)
Calcium: 9.7 mg/dL (ref 8.9–10.3)
Chloride: 102 mmol/L (ref 98–111)
Creatinine, Ser: 0.95 mg/dL (ref 0.44–1.00)
GFR, Estimated: >60 mL/min (ref >60)
Glucose, Bld: 135 mg/dL — ABNORMAL HIGH (ref 70–99)
Potassium: 4.8 mmol/L (ref 3.5–5.1)
Sodium: 134 mmol/L — ABNORMAL LOW (ref 135–145)
Total Bilirubin: 0.7 mg/dL (ref 0.3–1.2)
Total Protein: 7 g/dL (ref 6.5–8.1)

## 2022-09-19 LAB — RESP PANEL BY RT-PCR (RSV, FLU A&B, COVID)  RVPGX2
Influenza A by PCR: NEGATIVE
Influenza B by PCR: NEGATIVE
Resp Syncytial Virus by PCR: NEGATIVE
SARS Coronavirus 2 by RT PCR: NEGATIVE

## 2022-09-19 LAB — TROPONIN I (HIGH SENSITIVITY): Troponin I (High Sensitivity): 6 ng/L (ref <18)

## 2022-09-19 NOTE — ED Provider Triage Note (Signed)
Emergency Medicine Provider Triage Evaluation Note  Avella Richmeier , a 77 y.o. female  was evaluated in triage.  Pt complains of chest pain.  Patient reports that she has had on and off chest pain since earlier today.  No prior history of any cardiac abnormalities.  He is currently being treated for stage I lung cancer.  Denies any worsening significant shortness of breath but does feel that deep inhalation feels tight.  No associated nausea, diaphoresis, palpitations.  Review of Systems  Positive: As above Negative: As above  Physical Exam  BP 118/60   Pulse 88   Temp 97.8 F (36.6 C) (Oral)   Resp 15   SpO2 96%  Gen:   Awake, no distress   Resp:  Normal effort  MSK:   Moves extremities without difficulty  Other:  No wheezing, rales, or crackles  Medical Decision Making  Medically screening exam initiated at 6:42 PM.  Appropriate orders placed.  Mylene Luevano was informed that the remainder of the evaluation will be completed by another provider, this initial triage assessment does not replace that evaluation, and the importance of remaining in the ED until their evaluation is complete.     Luvenia Heller, PA-C 09/19/22 1843

## 2022-09-19 NOTE — ED Triage Notes (Signed)
Pt here from home with c/o chest pain off and on today since 1030 this am , pt received '324mg'$  asas nd 2 nitro with ems , no n/v some slight sob

## 2022-09-19 NOTE — ED Notes (Signed)
Unable to get blood ?

## 2022-09-19 NOTE — ED Provider Notes (Signed)
[facility] Provider Note  CSN: [id number] Arrival date & time: 09/19/22 1832  Chief Complaint(s) Chest Pain  HPI Dawn Collins is a 77 y.o. female with a past medical history listed below including asthma, right upper lobe lung nodule status post radiation who presents to the emergency department with 1 day of constant but fluctuating chest pain described as a burning sensation.  No alleviating or aggravating factors but patient did report having dyspnea on exertion.  Patient is also been having several weeks of cough.  No fevers or chills.  Patient is currently on prednisone and cough medicine.  No nausea or vomiting.  No abdominal pain.  No other physical complaints.  The history is provided by the patient.    Past Medical History Past Medical History:  Diagnosis Date   Arthritis    knees, back.   Asthma    2 weeks cold,no problems now-well controlled   Bursitis, knee    Bil. Hips, not knee   Complication of anesthesia    slow to wake up   Diverticulitis of colon 05-28-11   no current problems   GERD (gastroesophageal reflux disease)    tx. pantoprazole   Hypertension    Multiple thyroid nodules    Pneumonia    PONV (postoperative nausea and vomiting)    Sleep apnea    No cpap now -3 months last due to insurance reasons   Patient Active Problem List   Diagnosis Date Noted   Chest pain 02/10/2022   Asthma, chronic 02/10/2022   Hyperlipidemia 02/10/2022   Essential hypertension 02/10/2022   UTI (urinary tract infection) 02/10/2022   OA (osteoarthritis) of knee 08/08/2015   Osteoarthritis of right knee 06/04/2011   Home Medication(s) Prior to Admission medications   Medication Sig Start Date End Date Taking? Authorizing Provider  doxycycline (VIBRAMYCIN) 100 MG capsule Take 1 capsule (100 mg total) by mouth 2 (two) times daily for 7 days. 09/20/22 09/27/22 Yes Cassady Stanczak, Grayce Sessions, MD  acetaminophen (TYLENOL) 500 MG tablet  Take 500-1,000 mg by mouth every 6 (six) hours as needed for moderate pain. For arthritis    [provider]  albuterol (VENTOLIN HFA) 108 (90 Base) MCG/ACT inhaler Inhale 2 puffs into the lungs every 6 (six) hours as needed for wheezing or shortness of breath.    [provider]  aspirin EC 81 MG tablet Take 81 mg by mouth daily.    [provider]  cephALEXin (KEFLEX) 500 MG capsule Take 1 capsule (500 mg total) by mouth 2 (two) times daily. 02/12/22   Patrecia Pour, MD  diltiazem (CARDIZEM CD) 360 MG 24 hr capsule Take 360 mg by mouth daily. 01/22/22   [provider]  estradiol (ESTRACE) 2 MG tablet Take 2 mg by mouth daily.    [provider]  ezetimibe (ZETIA) 10 MG tablet Take 10 mg by mouth every evening. 01/25/22   [provider]  famotidine (PEPCID) 20 MG tablet Take 1 tablet (20 mg total) by mouth 2 (two) times daily. 09/06/20 10/06/20  Wyvonnia Dusky, MD  fluticasone (FLONASE) 50 MCG/ACT nasal spray Place 1-2 sprays into both nostrils daily as needed for allergies. 07/17/17   [provider]  fluticasone furoate-vilanterol (BREO ELLIPTA) 100-25 MCG/INH AEPB Inhale 1 puff by mouth into the lungs daily. Patient taking differently: Inhale 1 puff into the lungs daily as needed (sob/wheezing). 02/27/21   Blanchie Dessert, MD  guaiFENesin (MUCINEX) 600 MG 12 hr tablet  Take 600 mg by mouth 2 (two) times daily as needed for cough. 10/22/18   [provider]  lactose free nutrition (BOOST) LIQD Take 237 mLs by mouth daily.    [provider]  latanoprost (XALATAN) 0.005 % ophthalmic solution Place 1 drop into both eyes at bedtime. 06/20/15   [provider]  magnesium oxide (MAG-OX) 400 (240 Mg) MG tablet Take 1 tablet by mouth daily. 07/05/22   [provider]  meloxicam (MOBIC) 15 MG tablet Take 15 mg by mouth daily as needed for pain. 11/27/21   [provider]  montelukast (SINGULAIR) 10 MG  tablet Take 10 mg by mouth at bedtime.    [provider]  pantoprazole (PROTONIX) 40 MG tablet Take 1 tablet (40 mg total) by mouth 2 (two) times daily. 02/12/22   Patrecia Pour, MD  rosuvastatin (CRESTOR) 20 MG tablet Take 1 tablet (20 mg total) by mouth daily. 05/01/22   Lenna Sciara, NP  spironolactone (ALDACTONE) 25 MG tablet Take 25 mg by mouth daily. 01/18/22   [provider]  telmisartan (MICARDIS) 80 MG tablet Take 80 mg by mouth daily. 01/29/22   [provider]                                                                                                                                    Allergies Morphine and related, Avelox [moxifloxacin hcl in nacl], Celebrex [celecoxib], Clarithromycin, Codeine, Food, Hycodan [hydrocodone bit-homatrop mbr], Keflex [cephalexin], Pentazocine, Septra [bactrim], Sertraline hcl, Stadol [butorphanol tartrate], Duraphen [phenylephrine-guaifenesin], Floxin [ofloxacin], and Penicillins  Review of Systems Review of Systems As noted in HPI  Physical Exam Vital Signs  I have reviewed the triage vital signs BP (!) 121/50   Pulse (!) 53   Temp 98 F (36.7 C)   Resp 16   SpO2 96%   Physical Exam Vitals reviewed.  Constitutional:      General: She is not in acute distress.    Appearance: She is well-developed. She is not diaphoretic.  HENT:     Head: Normocephalic and atraumatic.     Nose: Nose normal.  Eyes:     General: No scleral icterus.       Right eye: No discharge.        Left eye: No discharge.     Conjunctiva/sclera: Conjunctivae normal.     Pupils: Pupils are equal, round, and reactive to light.  Cardiovascular:     Rate and Rhythm: Normal rate and regular rhythm.     Heart sounds: No murmur heard.    No friction rub. No gallop.  Pulmonary:     Effort: Pulmonary effort is normal. No respiratory distress.     Breath sounds: No stridor. Examination of the right-lower field reveals rales. Rales present. No  wheezing or rhonchi.  Abdominal:     General: There is no distension.     Palpations: Abdomen is  soft.     Tenderness: There is no abdominal tenderness.  Musculoskeletal:        General: No tenderness.     Cervical back: Normal range of motion and neck supple.  Skin:    General: Skin is warm and dry.     Findings: No erythema or rash.  Neurological:     Mental Status: She is alert and oriented to person, place, and time.     ED Results and Treatments Labs (all labs ordered are listed, but only abnormal results are displayed) Labs Reviewed  COMPREHENSIVE METABOLIC PANEL - Abnormal; Notable for the following components:      Result Value   Sodium 134 (*)    Glucose, Bld 135 (*)    All other components within normal limits  CBC WITH DIFFERENTIAL/PLATELET - Abnormal; Notable for the following components:   WBC 12.7 (*)    Neutro Abs 11.7 (*)    All other components within normal limits  RESP PANEL BY RT-PCR (RSV, FLU A&B, COVID)  RVPGX2  TROPONIN I (HIGH SENSITIVITY)  TROPONIN I (HIGH SENSITIVITY)                                                                                                                         EKG  EKG Interpretation  Date/Time:  Thursday September 20 2022 00:10:33 EST Ventricular Rate:  59 PR Interval:  155 QRS Duration: 102 QT Interval:  414 QTC Calculation: 411 R Axis:   3 Text Interpretation: Sinus rhythm Probable lateral infarct, old No acute changes Confirmed by Addison Lank [phone]) on 09/20/2022 12:13:09 AM       Radiology CT Angio Chest PE W and/or Wo Contrast  Result Date: 09/20/2022 CLINICAL DATA:  Pulmonary embolism suspected, high probability. EXAM: CT ANGIOGRAPHY CHEST WITH CONTRAST TECHNIQUE: Multidetector CT imaging of the chest was performed using the standard protocol during bolus administration of intravenous contrast. Multiplanar CT image reconstructions and MIPs were obtained to evaluate the vascular anatomy. RADIATION DOSE  REDUCTION: This exam was performed according to the departmental dose-optimization program which includes automated exposure control, adjustment of the mA and/or kV according to patient size and/or use of iterative reconstruction technique. CONTRAST:  21m OMNIPAQUE IOHEXOL 350 MG/ML SOLN COMPARISON:  05/07/2022. FINDINGS: Cardiovascular: Heart is normal in size and there is no pericardial effusion. Three-vessel coronary artery calcifications are noted. There is atherosclerotic calcification of the aorta without evidence of aneurysm. The pulmonary trunk is normal in caliber. No evidence of pulmonary embolism. Evaluation at the lung bases is limited due to respiratory motion artifact. Mediastinum/Nodes: No enlarged mediastinal, hilar, or axillary lymph nodes. Thyroid gland, trachea, and esophagus demonstrate no significant findings. Lungs/Pleura: 3 mm nodule is present in the right upper lobe, axial image 41 common decreased in size from the prior exam. There is a 3 mm nodule in the right upper lobe, axial image 49, unchanged. Consolidation is present in the right middle and right lower lobes. The previously described 1.3 mm  nodule in the right middle lobe is not visualized on this exam due to consolidation. There is mild atelectasis on the left. No effusion or pneumothorax. Upper Abdomen: No acute abnormality. Musculoskeletal: Degenerative changes are present in the thoracic spine and at the sternoclavicular joint on the right. No acute osseous abnormality. Review of the MIP images confirms the above findings. IMPRESSION: 1. No evidence of pulmonary embolism. 2. Consolidation at the right lung base, possible atelectasis or infiltrate. 3. Essentially stable or decreased in size right pulmonary nodules measuring up to 3 mm. The previously described 1.3 cm nodule in the right middle lobe is not visualized on this exam which may be due to consolidation. Short-term follow-up is recommended to document resolution. 4.  Multi-vessel coronary artery calcifications. 5. Aortic atherosclerosis. Electronically Signed   By: Brett Fairy M.D.   On: 09/20/2022 01:10   DG Chest 1 View  Result Date: 09/19/2022 CLINICAL DATA:  Chest pain EXAM: CHEST  1 VIEW COMPARISON:  Chest x-ray 08/29/2022 FINDINGS: There is minimal patchy airspace disease in the right lower lung. There is no pleural effusion or pneumothorax. The cardiomediastinal silhouette is within normal limits. No acute fractures are seen. IMPRESSION: Minimal patchy airspace disease in the right lower lung. Please correlate clinically for pneumonia. Recommend follow-up PA and lateral chest x-ray in 4-6 weeks to confirm complete resolution. Electronically Signed   By: Ronney Asters M.D.   On: 09/19/2022 20:44    Medications Ordered in ED Medications  alum & mag hydroxide-simeth (MAALOX/MYLANTA) 200-200-20 MG/5ML suspension 30 mL (30 mLs Oral Patient Refused/Not Given 09/20/22 0035)    And  lidocaine (XYLOCAINE) 2 % viscous mouth solution 15 mL (15 mLs Oral Patient Refused/Not Given 09/20/22 0035)  iohexol (OMNIPAQUE) 350 MG/ML injection 75 mL (75 mLs Intravenous Contrast Given 09/20/22 0056)  doxycycline (VIBRA-TABS) tablet 100 mg (100 mg Oral Given 09/20/22 0150)                                                                                                                                     Procedures Procedures  (including critical care time)  Medical Decision Making / ED Course  Click here for ABCD2, HEART and other calculators  Medical Decision Making Amount and/or Complexity of Data Reviewed Labs:  Decision-making details documented in ED Course. Radiology: ordered and independent interpretation performed. Decision-making details documented in ED Course. ECG/medicine tests: ordered and independent interpretation performed. Decision-making details documented in ED Course.  Risk OTC drugs. Prescription drug management. Decision regarding  hospitalization.   This patient presents to the ED for concern of chest pain, this involves an extensive number of treatment options, and is a complaint that carries with it a high risk of complications and morbidity. The differential diagnosis includes but not limited to pneumonia, pulmonary embolism.  Feel ACS is less likely but will rule out EKG and serial troponins.  Presentation is not classic for dissection or  esophageal perforation.  Unlikely asthma exacerbation given lack of wheezing currently.  EKG without acute ischemic changes or evidence of pericarditis.  Serial troponins negative x 2 CBC with leukocytosis.  No anemia. Metabolic panel without significant electrolyte derangements or renal sufficiency.  Chest x-ray with evidence of right lower lobe atelectasis versus infiltrate.  Viral panel negative for COVID/influenza/RSV  CT angio negative for pulmonary embolism but did reveal right lower lobe consolidation concerning for pneumonia given her clinical picture.  Patient was ambulated and satted above 95%.  No respiratory distress.  Feel she does not require admission to the hospital and would be appropriate for continued outpatient management.  She was given her first dose of antibiotics in the emergency department.         Final Clinical Impression(s) / ED Diagnoses Final diagnoses:  Pneumonia of right lower lobe due to infectious organism   The patient appears reasonably screened and/or stabilized for discharge and I doubt any other medical condition or other [facility] requiring further screening, evaluation, or treatment in the ED at this time. I have discussed the findings, Dx and Tx plan with the patient/family who expressed understanding and agree(s) with the plan. Discharge instructions discussed at length. The patient/family was given strict return precautions who verbalized understanding of the instructions. No further questions at time of discharge.  Disposition:  Discharge  Condition: Good  ED Discharge Orders          Ordered    doxycycline (VIBRAMYCIN) 100 MG capsule  2 times daily        09/20/22 0216             Follow Up: Jonathon Bellows, PA-C [address] [place] [address] [phone]  Call  to schedule an appointment for close follow up           This chart was dictated using voice recognition software.  Despite best efforts to proofread,  errors can occur which can change the documentation meaning.    Fatima Blank, MD 09/20/22 [phone]

## 2022-09-19 NOTE — ED Provider Notes (Incomplete)
[facility] Provider Note  CSN: [id number] Arrival date & time: 09/19/22 1832  Chief Complaint(s) Chest Pain  HPI Dawn Collins is a 77 y.o. female {Add pertinent medical, surgical, social history, OB history to HPI:1}   The history is provided by the patient.    Past Medical History Past Medical History:  Diagnosis Date  . Arthritis    knees, back.  . Asthma    2 weeks cold,no problems now-well controlled  . Bursitis, knee    Bil. Hips, not knee  . Complication of anesthesia    slow to wake up  . Diverticulitis of colon 05-28-11   no current problems  . GERD (gastroesophageal reflux disease)    tx. pantoprazole  . Hypertension   . Multiple thyroid nodules   . Pneumonia   . PONV (postoperative nausea and vomiting)   . Sleep apnea    No cpap now -3 months last due to insurance reasons   Patient Active Problem List   Diagnosis Date Noted  . Chest pain 02/10/2022  . Asthma, chronic 02/10/2022  . Hyperlipidemia 02/10/2022  . Essential hypertension 02/10/2022  . UTI (urinary tract infection) 02/10/2022  . OA (osteoarthritis) of knee 08/08/2015  . Osteoarthritis of right knee 06/04/2011   Home Medication(s) Prior to Admission medications   Medication Sig Start Date End Date Taking? Authorizing Provider  acetaminophen (TYLENOL) 500 MG tablet Take 500-1,000 mg by mouth every 6 (six) hours as needed for moderate pain. For arthritis    [provider]  albuterol (VENTOLIN HFA) 108 (90 Base) MCG/ACT inhaler Inhale 2 puffs into the lungs every 6 (six) hours as needed for wheezing or shortness of breath.    [provider]  aspirin EC 81 MG tablet Take 81 mg by mouth daily.    [provider]  cephALEXin (KEFLEX) 500 MG capsule Take 1 capsule (500 mg total) by mouth 2 (two) times daily. 02/12/22   Patrecia Pour, MD  diltiazem (CARDIZEM CD) 360 MG 24 hr capsule Take 360 mg by mouth daily. 01/22/22   [provider]  estradiol (ESTRACE) 2 MG tablet Take 2 mg by mouth daily.    [provider]  ezetimibe (ZETIA) 10 MG tablet Take 10 mg by mouth every evening. 01/25/22   [provider]  famotidine (PEPCID) 20 MG tablet Take 1 tablet (20 mg total) by mouth 2 (two) times daily. 09/06/20 10/06/20  Wyvonnia Dusky, MD  fluticasone (FLONASE) 50 MCG/ACT nasal spray Place 1-2 sprays into both nostrils daily as needed for allergies. 07/17/17   [provider]  fluticasone furoate-vilanterol (BREO ELLIPTA) 100-25 MCG/INH AEPB Inhale 1 puff by mouth into the lungs daily. Patient taking differently: Inhale 1 puff into the lungs daily as needed (sob/wheezing). 02/27/21   Blanchie Dessert, MD  guaiFENesin (MUCINEX) 600 MG 12 hr tablet Take 600 mg by mouth 2 (two) times daily as needed for cough. 10/22/18   [provider]  lactose free nutrition (BOOST) LIQD Take 237 mLs by mouth daily.    [provider]  latanoprost (XALATAN) 0.005 % ophthalmic solution Place 1 drop into both eyes at bedtime. 06/20/15   [provider]  magnesium oxide (MAG-OX) 400 (240 Mg) MG tablet Take 1 tablet by mouth daily. 07/05/22   [provider]  meloxicam (MOBIC) 15 MG tablet Take 15 mg by mouth daily as needed for pain. 11/27/21   [provider]  montelukast (SINGULAIR) 10 MG  tablet Take 10 mg by mouth at bedtime.    [provider]  pantoprazole (PROTONIX) 40 MG tablet Take 1 tablet (40 mg total) by mouth 2 (two) times daily. 02/12/22   Patrecia Pour, MD  rosuvastatin (CRESTOR) 20 MG tablet Take 1 tablet (20 mg total) by mouth daily. 05/01/22   Lenna Sciara, NP  spironolactone (ALDACTONE) 25 MG tablet Take 25 mg by mouth daily. 01/18/22   [provider]  telmisartan (MICARDIS) 80 MG tablet Take 80 mg by mouth daily. 01/29/22   [provider]                                                                                                                                     Allergies Morphine and related, Avelox [moxifloxacin hcl in nacl], Celebrex [celecoxib], Clarithromycin, Codeine, Food, Hycodan [hydrocodone bit-homatrop mbr], Keflex [cephalexin], Pentazocine, Septra [bactrim], Sertraline hcl, Stadol [butorphanol tartrate], Duraphen [phenylephrine-guaifenesin], Floxin [ofloxacin], and Penicillins  Review of Systems Review of Systems As noted in HPI  Physical Exam Vital Signs  I have reviewed the triage vital signs BP (!) 124/59 (BP Location: Left Arm)   Pulse 64   Temp 97.8 F (36.6 C) (Oral)   Resp 20   SpO2 98%  *** Physical Exam  ED Results and Treatments Labs (all labs ordered are listed, but only abnormal results are displayed) Labs Reviewed  COMPREHENSIVE METABOLIC PANEL - Abnormal; Notable for the following components:      Result Value   Sodium 134 (*)    Glucose, Bld 135 (*)    All other components within normal limits  CBC WITH DIFFERENTIAL/PLATELET - Abnormal; Notable for the following components:   WBC 12.7 (*)    Neutro Abs 11.7 (*)    All other components within normal limits  RESP PANEL BY RT-PCR (RSV, FLU A&B, COVID)  RVPGX2  TROPONIN I (HIGH SENSITIVITY)  TROPONIN I (HIGH SENSITIVITY)                                                                                                                         EKG  EKG Interpretation  Date/Time:    Ventricular Rate:    PR Interval:    QRS Duration:   QT Interval:    QTC Calculation:   R Axis:     Text Interpretation:         Radiology DG Chest  1 View  Result Date: 09/19/2022 CLINICAL DATA:  Chest pain EXAM: CHEST  1 VIEW COMPARISON:  Chest x-ray 08/29/2022 FINDINGS: There is minimal patchy airspace disease in the right lower lung. There is no pleural effusion or pneumothorax. The cardiomediastinal silhouette is within normal limits. No acute fractures are seen. IMPRESSION: Minimal patchy airspace disease in the right lower lung.  Please correlate clinically for pneumonia. Recommend follow-up PA and lateral chest x-ray in 4-6 weeks to confirm complete resolution. Electronically Signed   By: Ronney Asters M.D.   On: 09/19/2022 20:44    Medications Ordered in ED Medications - No data to display                                                                                                                                   Procedures Procedures  (including critical care time)  Medical Decision Making / ED Course  Click here for ABCD2, HEART and other calculators  Medical Decision Making         Final Clinical Impression(s) / ED Diagnoses Final diagnoses:  None    {Document critical care time when appropriate:1}  {Document review of labs and clinical decision tools ie heart score, Chads2Vasc2 etc:1}  {Document your independent review of radiology images, and any outside records:1} {Document your discussion with family members, caretakers, and with consultants:1} {Document social determinants of health affecting pt's care:1} {Document your decision making why or why not admission, treatments were needed:1} This chart was dictated using voice recognition software.  Despite best efforts to proofread,  errors can occur which can change the documentation meaning.

## 2022-09-20 ENCOUNTER — Emergency Department (HOSPITAL_COMMUNITY): Payer: Medicare Other

## 2022-09-20 DIAGNOSIS — J181 Lobar pneumonia, unspecified organism: Secondary | ICD-10-CM | POA: Diagnosis not present

## 2022-09-20 LAB — TROPONIN I (HIGH SENSITIVITY): Troponin I (High Sensitivity): 8 ng/L (ref <18)

## 2022-09-20 MED ORDER — DOXYCYCLINE HYCLATE 100 MG PO TABS
100.0000 mg | ORAL_TABLET | Freq: Once | ORAL | Status: AC
  Administered 2022-09-20: 100 mg via ORAL
  Filled 2022-09-20: qty 1

## 2022-09-20 MED ORDER — ALUM & MAG HYDROXIDE-SIMETH 200-200-20 MG/5ML PO SUSP
30.0000 mL | Freq: Once | ORAL | Status: DC
  Filled 2022-09-20: qty 30

## 2022-09-20 MED ORDER — IOHEXOL 350 MG/ML SOLN
75.0000 mL | Freq: Once | INTRAVENOUS | Status: AC | PRN
  Administered 2022-09-20: 75 mL via INTRAVENOUS

## 2022-09-20 MED ORDER — LIDOCAINE VISCOUS HCL 2 % MT SOLN
15.0000 mL | Freq: Once | OROMUCOSAL | Status: DC
  Filled 2022-09-20: qty 15

## 2022-09-20 MED ORDER — DOXYCYCLINE HYCLATE 100 MG PO CAPS: 100.0000 mg | ORAL_CAPSULE | Freq: Two times a day (BID) | ORAL | 0 refills | Status: AC

## 2022-09-20 NOTE — ED Notes (Signed)
PT maintained a SP02 of 98 ambulating to and from the bathroom.

## 2022-12-07 ENCOUNTER — Encounter (HOSPITAL_BASED_OUTPATIENT_CLINIC_OR_DEPARTMENT_OTHER): Payer: Self-pay | Admitting: Emergency Medicine

## 2022-12-07 ENCOUNTER — Emergency Department (HOSPITAL_BASED_OUTPATIENT_CLINIC_OR_DEPARTMENT_OTHER): Payer: Medicare Other

## 2022-12-07 ENCOUNTER — Other Ambulatory Visit: Payer: Self-pay

## 2022-12-07 ENCOUNTER — Emergency Department (HOSPITAL_BASED_OUTPATIENT_CLINIC_OR_DEPARTMENT_OTHER)
Admission: EM | Admit: 2022-12-07 | Discharge: 2022-12-07 | Disposition: A | Discharge status: Home / Self Care | Payer: Medicare Other | Attending: Emergency Medicine | Admitting: Emergency Medicine

## 2022-12-07 DIAGNOSIS — Z7982 Long term (current) use of aspirin: Secondary | ICD-10-CM | POA: Insufficient documentation

## 2022-12-07 DIAGNOSIS — J45909 Unspecified asthma, uncomplicated: Secondary | ICD-10-CM | POA: Insufficient documentation

## 2022-12-07 DIAGNOSIS — Z79899 Other long term (current) drug therapy: Secondary | ICD-10-CM | POA: Diagnosis not present

## 2022-12-07 DIAGNOSIS — R61 Generalized hyperhidrosis: Secondary | ICD-10-CM | POA: Diagnosis present

## 2022-12-07 DIAGNOSIS — I10 Essential (primary) hypertension: Secondary | ICD-10-CM | POA: Diagnosis not present

## 2022-12-07 DIAGNOSIS — Z7951 Long term (current) use of inhaled steroids: Secondary | ICD-10-CM | POA: Insufficient documentation

## 2022-12-07 LAB — BASIC METABOLIC PANEL
Anion gap: 8 (ref 5–15)
BUN: 21 mg/dL (ref 8–23)
CO2: 24 mmol/L (ref 22–32)
Calcium: 9.3 mg/dL (ref 8.9–10.3)
Chloride: 101 mmol/L (ref 98–111)
Creatinine, Ser: 1.03 mg/dL — ABNORMAL HIGH (ref 0.44–1.00)
GFR, Estimated: 56 mL/min — ABNORMAL LOW (ref >60)
Glucose, Bld: 91 mg/dL (ref 70–99)
Potassium: 4 mmol/L (ref 3.5–5.1)
Sodium: 133 mmol/L — ABNORMAL LOW (ref 135–145)

## 2022-12-07 LAB — CBC
HCT: 37.5 % (ref 36.0–46.0)
Hemoglobin: 12.5 g/dL (ref 12.0–15.0)
MCH: 30.3 pg (ref 26.0–34.0)
MCHC: 33.3 g/dL (ref 30.0–36.0)
MCV: 90.8 fL (ref 80.0–100.0)
Platelets: 244 10*3/uL (ref 150–400)
RBC: 4.13 MIL/uL (ref 3.87–5.11)
RDW: 13.2 % (ref 11.5–15.5)
WBC: 8 10*3/uL (ref 4.0–10.5)
nRBC: 0 % (ref 0.0–0.2)

## 2022-12-07 LAB — TROPONIN I (HIGH SENSITIVITY): Troponin I (High Sensitivity): 4 ng/L (ref <18)

## 2022-12-07 NOTE — Discharge Instructions (Signed)
Your workup was reassuring today.  No clear infection seen.  Your sugar could have been low or other cause that is improved.  Follow-up with your doctor.

## 2022-12-07 NOTE — ED Triage Notes (Signed)
Patient arrived via POV c/o chest pain x 6hr pta. 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 Patient states becoming diaphoretic at work, with chest pressure. Patient denies pain at this time. Patient is AO x 4, VS WDL, normal gait.

## 2022-12-07 NOTE — ED Provider Notes (Signed)
[place] [facility] AT [facility] Provider Note   CSN: [id number] Arrival date & time: 12/07/22  1859     History  Chief Complaint  Patient presents with   Chest Pain    Dawn Collins is a 77 y.o. female.   Chest Pain Patient presents after feeling bad.  States she was at work today and began to feel lightheaded.  Did not feel chest pain.  Did not feel pressure.  States that she got very sweaty and began to feel as if she had chills after.  Ate some food and was feeling better.  Previous history of hyperglycemia but not diabetic.  Previous history of non-STEMI.  No chest pain with this however.  Feeling better now.  Has been doing well but did not really eat much today before the episode.    Past Medical History:  Diagnosis Date   Arthritis    knees, back.   Asthma    2 weeks cold,no problems now-well controlled   Bursitis, knee    Bil. Hips, not knee   Complication of anesthesia    slow to wake up   Diverticulitis of colon 05-28-11   no current problems   GERD (gastroesophageal reflux disease)    tx. pantoprazole   Hypertension    Multiple thyroid nodules    Pneumonia    PONV (postoperative nausea and vomiting)    Sleep apnea    No cpap now -3 months last due to insurance reasons    Home Medications Prior to Admission medications   Medication Sig Start Date End Date Taking? Authorizing Provider  acetaminophen (TYLENOL) 500 MG tablet Take 500-1,000 mg by mouth every 6 (six) hours as needed for moderate pain. For arthritis    [provider]  albuterol (VENTOLIN HFA) 108 (90 Base) MCG/ACT inhaler Inhale 2 puffs into the lungs every 6 (six) hours as needed for wheezing or shortness of breath.    [provider]  aspirin EC 81 MG tablet Take 81 mg by mouth daily.    [provider]  cephALEXin (KEFLEX) 500 MG capsule Take 1 capsule (500 mg total) by mouth 2 (two) times daily. 02/12/22   Tyrone Nine, MD  diltiazem  (CARDIZEM CD) 360 MG 24 hr capsule Take 360 mg by mouth daily. 01/22/22   [provider]  estradiol (ESTRACE) 2 MG tablet Take 2 mg by mouth daily.    [provider]  ezetimibe (ZETIA) 10 MG tablet Take 10 mg by mouth every evening. 01/25/22   [provider]  famotidine (PEPCID) 20 MG tablet Take 1 tablet (20 mg total) by mouth 2 (two) times daily. 09/06/20 10/06/20  Terald Sleeper, MD  fluticasone (FLONASE) 50 MCG/ACT nasal spray Place 1-2 sprays into both nostrils daily as needed for allergies. 07/17/17   [provider]  fluticasone furoate-vilanterol (BREO ELLIPTA) 100-25 MCG/INH AEPB Inhale 1 puff by mouth into the lungs daily. Patient taking differently: Inhale 1 puff into the lungs daily as needed (sob/wheezing). 02/27/21   Gwyneth Sprout, MD  guaiFENesin (MUCINEX) 600 MG 12 hr tablet Take 600 mg by mouth 2 (two) times daily as needed for cough. 10/22/18   [provider]  lactose free nutrition (BOOST) LIQD Take 237 mLs by mouth daily.    [provider]  latanoprost (XALATAN) 0.005 % ophthalmic solution Place 1 drop into both eyes at bedtime. 06/20/15   [provider]  magnesium oxide (MAG-OX) 400 (240 Mg) MG tablet Take  1 tablet by mouth daily. 07/05/22   [provider]  meloxicam (MOBIC) 15 MG tablet Take 15 mg by mouth daily as needed for pain. 11/27/21   [provider]  montelukast (SINGULAIR) 10 MG tablet Take 10 mg by mouth at bedtime.    [provider]  pantoprazole (PROTONIX) 40 MG tablet Take 1 tablet (40 mg total) by mouth 2 (two) times daily. 02/12/22   Tyrone Nine, MD  rosuvastatin (CRESTOR) 20 MG tablet Take 1 tablet (20 mg total) by mouth daily. 05/01/22   Joylene Grapes, NP  spironolactone (ALDACTONE) 25 MG tablet Take 25 mg by mouth daily. 01/18/22   [provider]  telmisartan (MICARDIS) 80 MG tablet Take 80 mg by mouth daily. 01/29/22   [provider]       Allergies    Morphine and codeine, Avelox [moxifloxacin hcl in nacl], Celebrex [celecoxib], Clarithromycin, Codeine, Food, Hycodan [hydrocodone bit-homatrop mbr], Keflex [cephalexin], Pentazocine, Septra [bactrim], Sertraline hcl, Stadol [butorphanol tartrate], Duraphen [phenylephrine-guaifenesin], Floxin [ofloxacin], and Penicillins    Review of Systems   Review of Systems  Cardiovascular:  Positive for chest pain.    Physical Exam Updated Vital Signs BP (!) 166/61 (BP Location: Left Arm)   Pulse (!) 57   Temp 98 F (36.7 C)   Resp 18   Ht 5\' 5"  (1.651 m)   Wt 70.3 kg   SpO2 99%   BMI 25.79 kg/m  Physical Exam Vitals and nursing note reviewed.  Cardiovascular:     Rate and Rhythm: Normal rate and regular rhythm.  Pulmonary:     Breath sounds: No wheezing or rhonchi.  Chest:     Chest wall: No tenderness.  Abdominal:     Tenderness: There is no abdominal tenderness.  Musculoskeletal:     Right lower leg: No edema.     Left lower leg: No edema.  Skin:    Capillary Refill: Capillary refill takes less than 2 seconds.  Neurological:     Mental Status: She is alert and oriented to person, place, and time.     ED Results / Procedures / Treatments   Labs (all labs ordered are listed, but only abnormal results are displayed) Labs Reviewed  BASIC METABOLIC PANEL - Abnormal; Notable for the following components:      Result Value   Sodium 133 (*)    Creatinine, Ser 1.03 (*)    GFR, Estimated 56 (*)    All other components within normal limits  CBC  TROPONIN I (HIGH SENSITIVITY)    EKG EKG Interpretation  Date/Time:  Friday Dec 07 2022 19:20:11 EDT Ventricular Rate:  66 PR Interval:  144 QRS Duration: 84 QT Interval:  384 QTC Calculation: 402 R Axis:   40 Text Interpretation: Sinus rhythm with Premature atrial complexes Possible Lateral infarct , age undetermined Abnormal ECG When compared with ECG of 20-Sep-2022 00:10, No significant change since last tracing  Confirmed by Benjiman Core [phone]) on 12/07/2022 7:24:36 PM  Radiology DG Chest 2 View  Result Date: 12/07/2022 CLINICAL DATA:  Chest pain, metastatic non-small cell lung cancer EXAM: CHEST - 2 VIEW COMPARISON:  CT 10/23/2022, 10/05/2022, chest radiograph 10/05/2022 FINDINGS: Improving discoid consolidation within the basilar right middle lobe likely reflecting evolving post treatment consolidation better seen on prior CT examination. Lungs are otherwise clear. No pneumothorax or pleural effusion. Cardiac size within normal limits. Pulmonary vascularity is normal. No acute bone abnormality. IMPRESSION: 1. Improving post treatment consolidation within the basilar right middle  lobe. Electronically Signed   By: Helyn Numbers M.D.   On: 12/07/2022 19:59    Procedures Procedures    Medications Ordered in ED Medications - No data to display  ED Course/ Medical Decision Making/ A&P                             Medical Decision Making Amount and/or Complexity of Data Reviewed Labs: ordered. Radiology: ordered.   Patient with diaphoresis.  Feeling better now.  Differential diagnosis includes infection, arrhythmia, hypoglycemia.  Cardiac cause considered but felt less likely.  EKG reassuring but will get blood work.  Will get troponin.  Reviewed notes from recent non-STEMI.  Troponin reassuring.  Workup reassuring.  Doubt cardiac ischemia.  Potentially could have been had hypoglycemia which is resolved.  Appears stable for outpatient follow-up.  Arrhythmia also considered.       Final Clinical Impression(s) / ED Diagnoses Final diagnoses:  Diaphoresis    Rx / DC Orders ED Discharge Orders     None         Benjiman Core, MD 12/07/22 2340

## 2023-05-01 NOTE — H&P (Signed)
TOTAL HIP ADMISSION H&P  Patient is admitted for right total hip arthroplasty.  Subjective:  Chief Complaint: Right hip pain  HPI: Dawn Collins, 77 y.o. female, has a history of pain and functional disability in the right hip due to arthritis and patient has failed non-surgical conservative treatments for greater than 12 weeks to include use of assistive devices and activity modification. Onset of symptoms was gradual, starting 4 years ago with gradually worsening course since that time. The patient noted no past surgery on the right hip. Patient currently rates pain in the right hip at 8 out of 10 with activity. Patient has worsening of pain with activity and weight bearing, trendelenberg gait, and pain that interfers with activities of daily living. Patient has evidence of periarticular osteophytes and joint space narrowing by imaging studies. This condition presents safety issues increasing the risk of falls. There is no current active infection.  Patient Active Problem List   Diagnosis Date Noted   Chest pain 02/10/2022   Asthma, chronic 02/10/2022   Hyperlipidemia 02/10/2022   Essential hypertension 02/10/2022   UTI (urinary tract infection) 02/10/2022   OA (osteoarthritis) of knee 08/08/2015   Osteoarthritis of right knee 06/04/2011    Past Medical History:  Diagnosis Date   Arthritis    knees, back.   Asthma    2 weeks cold,no problems now-well controlled   Bursitis, knee    Bil. Hips, not knee   Complication of anesthesia    slow to wake up   Diverticulitis of colon 05-28-11   no current problems   GERD (gastroesophageal reflux disease)    tx. pantoprazole   Hypertension    Multiple thyroid nodules    Pneumonia    PONV (postoperative nausea and vomiting)    Sleep apnea    No cpap now -3 months last due to insurance reasons    Past Surgical History:  Procedure Laterality Date   ABDOMINAL HYSTERECTOMY     partial hyst   BREAST BIOPSY     several- all benign    CARDIAC CATHETERIZATION     10'11   CERVICAL DISC SURGERY     1977   FOOT SURGERY     Bilateral   KNEE ARTHROSCOPY     right '04   TOTAL KNEE ARTHROPLASTY  06/04/2011   Procedure: TOTAL KNEE ARTHROPLASTY;  Surgeon: Loanne Drilling;  Location: [facility];  Service: Orthopedics;  Laterality: Right;   TOTAL KNEE ARTHROPLASTY Left 08/08/2015   Procedure: TOTAL LEFT KNEE ARTHROPLASTY;  Surgeon: Ollen Gross, MD;  Location: [facility];  Service: Orthopedics;  Laterality: Left;    Prior to Admission medications   Medication Sig Start Date End Date Taking? Authorizing Provider  acetaminophen (TYLENOL) 500 MG tablet Take 500-1,000 mg by mouth every 6 (six) hours as needed for moderate pain. For arthritis    [provider]  albuterol (VENTOLIN HFA) 108 (90 Base) MCG/ACT inhaler Inhale 2 puffs into the lungs every 6 (six) hours as needed for wheezing or shortness of breath.    [provider]  aspirin EC 81 MG tablet Take 81 mg by mouth daily.    [provider]  cephALEXin (KEFLEX) 500 MG capsule Take 1 capsule (500 mg total) by mouth 2 (two) times daily. 02/12/22   Tyrone Nine, MD  diltiazem (CARDIZEM CD) 360 MG 24 hr capsule Take 360 mg by mouth daily. 01/22/22   [provider]  estradiol (ESTRACE) 2 MG tablet Take 2 mg by mouth daily.  [provider]  ezetimibe (ZETIA) 10 MG tablet Take 10 mg by mouth every evening. 01/25/22   [provider]  famotidine (PEPCID) 20 MG tablet Take 1 tablet (20 mg total) by mouth 2 (two) times daily. 09/06/20 10/06/20  Terald Sleeper, MD  fluticasone (FLONASE) 50 MCG/ACT nasal spray Place 1-2 sprays into both nostrils daily as needed for allergies. 07/17/17   [provider]  fluticasone furoate-vilanterol (BREO ELLIPTA) 100-25 MCG/INH AEPB Inhale 1 puff by mouth into the lungs daily. Patient taking differently: Inhale 1 puff into the lungs daily as needed (sob/wheezing). 02/27/21   Gwyneth Sprout, MD   guaiFENesin (MUCINEX) 600 MG 12 hr tablet Take 600 mg by mouth 2 (two) times daily as needed for cough. 10/22/18   [provider]  lactose free nutrition (BOOST) LIQD Take 237 mLs by mouth daily.    [provider]  latanoprost (XALATAN) 0.005 % ophthalmic solution Place 1 drop into both eyes at bedtime. 06/20/15   [provider]  magnesium oxide (MAG-OX) 400 (240 Mg) MG tablet Take 1 tablet by mouth daily. 07/05/22   [provider]  meloxicam (MOBIC) 15 MG tablet Take 15 mg by mouth daily as needed for pain. 11/27/21   [provider]  montelukast (SINGULAIR) 10 MG tablet Take 10 mg by mouth at bedtime.    [provider]  pantoprazole (PROTONIX) 40 MG tablet Take 1 tablet (40 mg total) by mouth 2 (two) times daily. 02/12/22   Tyrone Nine, MD  rosuvastatin (CRESTOR) 20 MG tablet Take 1 tablet (20 mg total) by mouth daily. 05/01/22   Joylene Grapes, NP  spironolactone (ALDACTONE) 25 MG tablet Take 25 mg by mouth daily. 01/18/22   [provider]  telmisartan (MICARDIS) 80 MG tablet Take 80 mg by mouth daily. 01/29/22   [provider]    Allergies  Allergen Reactions   Morphine And Codeine Nausea And Vomiting   Avelox [Moxifloxacin Hcl In Nacl] Other (See Comments)    Numbness    Celebrex [Celecoxib] Other (See Comments)    Chest pain    Clarithromycin Nausea And Vomiting   Codeine Other (See Comments)    Sweating,vomiting and shortness of breath   Food Nausea And Vomiting    Seafood (all)   Hycodan [Hydrocodone Bit-Homatrop Mbr] Other (See Comments)    dyspnea   Keflex [Cephalexin] Other (See Comments)    SICK AND WEAK, TINGLING DOWN DOWN MY FACE   Pentazocine Other (See Comments)    Chest pain   Septra [Bactrim] Nausea And Vomiting   Sertraline Hcl Other (See Comments)    tinnitus   Stadol [Butorphanol Tartrate] Nausea And Vomiting   Duraphen [Phenylephrine-Guaifenesin] Palpitations   Floxin [Ofloxacin]  Rash   Penicillins Rash    Has patient had a PCN reaction causing immediate rash, facial/tongue/throat swelling, SOB or lightheadedness with hypotension: Yes Has patient had a PCN reaction causing severe rash involving mucus membranes or skin necrosis: Yes Has patient had a PCN reaction that required hospitalization No Has patient had a PCN reaction occurring within the last 10 years: No If all of the above answers are "NO", then may proceed with Cephalosporin use.     Social History   Socioeconomic History   Marital status: Widowed    Spouse name: Not on file   Number of children: Not on file   Years of education: Not on file   Highest education level: Not on file  Occupational History  Not on file  Tobacco Use   Smoking status: Never   Smokeless tobacco: Never  Vaping Use   Vaping status: Never Used  Substance and Sexual Activity   Alcohol use: No   Drug use: No   Sexual activity: Not on file  Other Topics Concern   Not on file  Social History Narrative   Not on file   Social Determinants of Health   Financial Resource Strain: Not on file  Food Insecurity: Low Risk  (10/08/2022)   [facility]   Hunger Vital Sign    Worried About Running Out of Food in the Last Year: Never true    Ran Out of Food in the Last Year: Never true  Transportation Needs: Not on file (10/08/2022)  Physical Activity: Not on file  Stress: Not on file  Social Connections: Not on file  Intimate Partner Violence: Not on file    Tobacco Use: Low Risk  (03/18/2023)   Received from [facility]   Patient History    Smoking Tobacco Use: Never    Smokeless Tobacco Use: Never    Passive Exposure: Not on file   Social History   Substance and Sexual Activity  Alcohol Use No    No family history on file.  ROS   Objective:  Physical Exam: - Well-developed female in no distress.  - Evaluation of her right hip shows flexion to 100 with no internal rotation  and about 10 degrees of internal rotation but significant pain on internal rotation.  - There is about 20 degrees of external rotation and 30 degrees of abduction.  - She is tender over the right greater trochanter but palpation does not reproduce the pain she is experiencing.  - Her right knee exam is normal.  - Her gait pattern is significantly antalgic on the right.   IMAGING: Radiographs of the AP pelvis and lateral right hip demonstrate severe bone-on-bone arthritis in the right hip with protrusio deformity and massive marginal osteophytes. This has progressed compared to x-rays from a year ago.  Assessment/Plan:  End stage arthritis, right hip  The patient history, physical examination, clinical judgement of the provider and imaging studies are consistent with end stage degenerative joint disease of the right hip and total hip arthroplasty is deemed medically necessary. The treatment options including medical management, injection therapy, arthroscopy and arthroplasty were discussed at length. The risks and benefits of total hip arthroplasty were presented and reviewed. The risks due to aseptic loosening, infection, stiffness, dislocation/subluxation, thromboembolic complications and other imponderables were discussed. The patient acknowledged the explanation, agreed to proceed with the plan and consent was signed. Patient is being admitted for inpatient treatment for surgery, pain control, PT, OT, prophylactic antibiotics, VTE prophylaxis, progressive ambulation and ADLs and discharge planning.The patient is planning to be discharged home.   Patient's anticipated LOS is less than 2 midnights, meeting these requirements: - Younger than 5 - Lives within 1 hour of care - Has a competent adult at home to recover with post-op recover - NO history of  - Chronic pain requiring opiods  - Diabetes  - Coronary Artery Disease  - Heart failure  - Stroke  - DVT/VTE  - Cardiac  arrhythmia  - Respiratory Failure/COPD  - Renal failure  - Anemia  - Advanced Liver disease    Therapy Plans: HEP Disposition: Home with sister and family Planned DVT Prophylaxis: Plavix+ASA vs Xarelto (see below) DME Needed: RW ordered PCP: Gardiner Fanti PA-C (requests  cardiology clearance) Cardiologist: Dr. Beverely Pace (clearance received, stating increased cardiac risk) TXA: IV Allergies: codeine, hydrocodone, percocet, dilaudid (N/V, tachycardia, lightheadedness); PCN (itching) Anesthesia Concerns: Difficulty waking BMI: 25.5 Last HgbA1c: Not diabetic Pharmacy: [facility] in [place]  Other: -needs PAT appt -Tylenol after TKAs. Tolerates tramadol -Hx lung cancer in December 2023, NSTEMI in February 2024. Will plan for Plavix + ASA post-operatively, unless cardiology discontinues Plavix at next OV on 10/28, in which case we will use Xarelto post-op for DVT prophylaxis  - Patient was instructed on what medications to stop prior to surgery. - Follow-up visit in 2 weeks with Dr. Lequita Halt - Begin physical therapy following surgery - Pre-operative lab work as pre-surgical testing - Prescriptions will be provided in hospital at time of discharge  Weston Brass, PA-C Orthopedic Surgery [facility] [place]

## 2023-05-20 NOTE — Progress Notes (Signed)
COVID Vaccine received:  []  No [x]  Yes Date of any COVID positive Test in last 90 days: no PCP - Gardiner Fanti PA-C Cardiologist -Alice Rieger PA-C  Chest x-ray - 12/07/22 Epic EKG - 05/20/23 [facility] Stress Test -  ECHO - 02/11/22 Epic Cardiac Cath -   Bowel Prep - [x]  No  []   Yes ______  Pacemaker / ICD device [x]  No []  Yes   Spinal Cord Stimulator:[x]  No []  Yes       History of Sleep Apnea? []  No [x]  Yes   CPAP used?- [x]  No []  Yes    Does the patient monitor blood sugar?          [x]  No []  Yes  []  N/A  Patient has: [x]  NO Hx DM   []  Pre-DM                 []  DM1  []   DM2 Does patient have a [employer] or Dexacom? []  No []  Yes   Fasting Blood Sugar Ranges-  Checks Blood Sugar _____ times a day  GLP1 agonist / usual dose - no GLP1 instructions:  SGLT-2 inhibitors / usual dose - no SGLT-2 instructions:   Blood Thinner / Instructions:Plavix To stop Oct. 31 Aspirin Instructions:81mg  to stop Oct. 31 Comments:   Activity level: Patient is able to climb a flight of stairs without difficulty; [x]  No CP  [x]  No SOB,  Patient can  perform ADLs without assistance.   Anesthesia review: HTN, OSA-noncompliant, CAD, MI  Patient denies shortness of breath, fever, cough and chest pain at PAT appointment.  Patient verbalized understanding and agreement to the Pre-Surgical Instructions that were given to them at this PAT appointment. Patient was also educated of the need to review these PAT instructions again prior to his/her surgery.I reviewed the appropriate phone numbers to call if they have any and questions or concerns.

## 2023-05-20 NOTE — Patient Instructions (Signed)
SURGICAL WAITING ROOM VISITATION  Patients having surgery or a procedure may have no more than 2 support people in the waiting area - these visitors may rotate.    Children under the age of 75 must have an adult with them who is not the patient.  Due to an increase in RSV and influenza rates and associated hospitalizations, children ages 25 and under may not visit patients in [facility] hospitals.  If the patient needs to stay at the hospital during part of their recovery, the visitor guidelines for inpatient rooms apply. Pre-op nurse will coordinate an appropriate time for 1 support person to accompany patient in pre-op.  This support person may not rotate.    Please refer to the [facility] website for the visitor guidelines for Inpatients (after your surgery is over and you are in a regular room).       Your procedure is scheduled on: 05/29/23   [facility]    Report to admitting at 10:30 AM   Call this number if you have problems the morning of surgery [phone]   Do not eat food :After Midnight.   After Midnight you may have the following liquids until 9:55 AM DAY OF SURGERY  Water Non-Citrus Juices (without pulp, NO RED-Apple, White grape, White cranberry) Black Coffee (NO MILK/CREAM OR CREAMERS, sugar ok)  Clear Tea (NO MILK/CREAM OR CREAMERS, sugar ok) regular and decaf                             Plain Jell-O (NO RED)                                           Fruit ices (not with fruit pulp, NO RED)                                     Popsicles (NO RED)                                                               Sports drinks like Gatorade (NO RED)                  The day of surgery:  Drink ONE (1) Pre-Surgery Clear Ensure at 9:55 AM the morning of surgery. Drink in one sitting. Do not sip.  This drink was given to you during your hospital  pre-op appointment visit. Nothing else to drink after completing the  Pre-Surgery Clear  Ensure           If you have questions, please contact your surgeon's office.     Oral Hygiene is also important to reduce your risk of infection.                                    Remember - BRUSH YOUR TEETH THE MORNING OF SURGERY WITH YOUR REGULAR TOOTHPASTE  DENTURES WILL BE REMOVED PRIOR TO SURGERY PLEASE DO NOT APPLY "Poly grip" OR  ADHESIVES!!!   Stop all vitamins and herbal supplements 7 days before surgery.   Take these medicines the morning of surgery with A SIP OF WATER: Estradiol, Ezetimibe, famotidine, gabapentin, metoprolol, aciphex, rosuvastatin, Spironolactone, tylenol if needed, inhaler, nasal spray  Bring CPAP mask and tubing day of surgery.                              You may not have any metal on your body including hair pins, jewelry, and body piercing             Do not wear make-up, lotions, powders, perfumes/cologne, or deodorant  Do not wear nail polish including gel and S&S, artificial/acrylic nails, or any other type of covering on natural nails including finger and toenails. If you have artificial nails, gel coating, etc. that needs to be removed by a nail salon please have this removed prior to surgery or surgery may need to be canceled/ delayed if the surgeon/ anesthesia feels like they are unable to be safely monitored.   Do not shave  48 hours prior to surgery.    Do not bring valuables to the hospital. [facility] IS NOT             RESPONSIBLE   FOR VALUABLES.   Contacts, glasses, dentures or bridgework may not be worn into surgery.   Bring small overnight bag day of surgery.   DO NOT BRING YOUR HOME MEDICATIONS TO THE HOSPITAL. PHARMACY WILL DISPENSE MEDICATIONS LISTED ON YOUR MEDICATION LIST TO YOU DURING YOUR ADMISSION IN THE HOSPITAL!    Patients discharged on the day of surgery will not be allowed to drive home.  Someone NEEDS to stay with you for the first 24 hours after anesthesia.   Special Instructions: Bring a copy of your healthcare  power of attorney and living will documents the day of surgery if you haven't scanned them before.              Please read over the following fact sheets you were given: IF YOU HAVE QUESTIONS ABOUT YOUR PRE-OP INSTRUCTIONS PLEASE CALL [phone] Dawn Collins   If you received a COVID test during your pre-op visit  it is requested that you wear a mask when out in public, stay away from anyone that may not be feeling well and notify your surgeon if you develop symptoms. If you test positive for Covid or have been in contact with anyone that has tested positive in the last 10 days please notify you surgeon.      Pre-operative 5 CHG Collins Instructions   You can play a key role in reducing the risk of infection after surgery. Your skin needs to be as free of germs as possible. You can reduce the number of germs on your skin by washing with CHG (chlorhexidine gluconate) soap before surgery. CHG is an antiseptic soap that kills germs and continues to kill germs even after washing.   DO NOT use if you have an allergy to chlorhexidine/CHG or antibacterial soaps. If your skin becomes reddened or irritated, stop using the CHG and notify one of our RNs at [phone].   Please shower with the CHG soap starting 4 days before surgery using the following schedule:     Please keep in mind the following:  DO NOT shave, including legs and underarms, starting the day of your first shower.   You may shave your face at any point before/day  of surgery.  Place clean sheets on your bed the day you start using CHG soap. Use a clean washcloth (not used since being washed) for each shower. DO NOT sleep with pets once you start using the CHG.   CHG Shower Instructions:  If you choose to wash your hair and private area, wash first with your normal shampoo/soap.  After you use shampoo/soap, rinse your hair and body thoroughly to remove shampoo/soap residue.  Turn the water OFF and apply about 3 tablespoons (45 ml) of  CHG soap to a CLEAN washcloth.  Apply CHG soap ONLY FROM YOUR NECK DOWN TO YOUR TOES (washing for 3-5 minutes)  DO NOT use CHG soap on face, private areas, open wounds, or sores.  Pay special attention to the area where your surgery is being performed.  If you are having back surgery, having someone wash your back for you may be helpful. Wait 2 minutes after CHG soap is applied, then you may rinse off the CHG soap.  Pat dry with a clean towel  Put on clean clothes/pajamas   If you choose to wear lotion, please use ONLY the CHG-compatible lotions on the back of this paper.     Additional instructions for the day of surgery: DO NOT APPLY any lotions, deodorants, cologne, or perfumes.   Put on clean/comfortable clothes.  Brush your teeth.  Ask your nurse before applying any prescription medications to the skin.      CHG Compatible Lotions   Aveeno Moisturizing lotion  Cetaphil Moisturizing Cream  Cetaphil Moisturizing Lotion  Clairol Herbal Essence Moisturizing Lotion, Dry Skin  Clairol Herbal Essence Moisturizing Lotion, Extra Dry Skin  Clairol Herbal Essence Moisturizing Lotion, Normal Skin  Curel Age Defying Therapeutic Moisturizing Lotion with Alpha Hydroxy  Curel Extreme Care Body Lotion  Curel Soothing Hands Moisturizing Hand Lotion  Curel Therapeutic Moisturizing Cream, Fragrance-Free  Curel Therapeutic Moisturizing Lotion, Fragrance-Free  Curel Therapeutic Moisturizing Lotion, Original Formula  Eucerin Daily Replenishing Lotion  Eucerin Dry Skin Therapy Plus Alpha Hydroxy Crme  Eucerin Dry Skin Therapy Plus Alpha Hydroxy Lotion  Eucerin Original Crme  Eucerin Original Lotion  Eucerin Plus Crme Eucerin Plus Lotion  Eucerin TriLipid Replenishing Lotion  Keri Anti-Bacterial Hand Lotion  Keri Deep Conditioning Original Lotion Dry Skin Formula Softly Scented  Keri Deep Conditioning Original Lotion, Fragrance Free Sensitive Skin Formula  Keri Lotion Fast Absorbing  Fragrance Free Sensitive Skin Formula  Keri Lotion Fast Absorbing Softly Scented Dry Skin Formula  Keri Original Lotion  Keri Skin Renewal Lotion Keri Silky Smooth Lotion  Keri Silky Smooth Sensitive Skin Lotion  Nivea Body Creamy Conditioning Oil  Nivea Body Extra Enriched Lotion  Nivea Body Original Lotion  Nivea Body Sheer Moisturizing Lotion Nivea Crme  Nivea Skin Firming Lotion  NutraDerm 30 Skin Lotion  NutraDerm Skin Lotion  NutraDerm Therapeutic Skin Cream  NutraDerm Therapeutic Skin Lotion  ProShield Protective Hand Cream    Incentive Spirometer (Watch this video at home: [email or web address])  An incentive spirometer is a tool that can help keep your lungs clear and active. This tool measures how well you are filling your lungs with each breath. Taking long deep breaths may help reverse or decrease the chance of developing breathing (pulmonary) problems (especially infection) following: A long period of time when you are unable to move or be active. BEFORE THE PROCEDURE  If the spirometer includes an indicator to show your best effort, your nurse or respiratory therapist will set it to a desired goal. If  possible, sit up straight or lean slightly forward. Try not to slouch. Hold the incentive spirometer in an upright position. INSTRUCTIONS FOR USE  Sit on the edge of your bed if possible, or sit up as far as you can in bed or on a chair. Hold the incentive spirometer in an upright position. Breathe out normally. Place the mouthpiece in your mouth and seal your lips tightly around it. Breathe in slowly and as deeply as possible, raising the piston or the ball toward the top of the column. Hold your breath for 3-5 seconds or for as long as possible. Allow the piston or ball to fall to the bottom of the column. Remove the mouthpiece from your mouth and breathe out normally. Rest for a few seconds and repeat Steps 1 through 7 at least 10 times every 1-2  hours when you are awake. Take your time and take a few normal breaths between deep breaths. The spirometer may include an indicator to show your best effort. Use the indicator as a goal to work toward during each repetition. After each set of 10 deep breaths, practice coughing to be sure your lungs are clear. If you have an incision (the cut made at the time of surgery), support your incision when coughing by placing a pillow or rolled up towels firmly against it. Once you are able to get out of bed, walk around indoors and cough well. You may stop using the incentive spirometer when instructed by your caregiver.  RISKS AND COMPLICATIONS Take your time so you do not get dizzy or light-headed. If you are in pain, you may need to take or ask for pain medication before doing incentive spirometry. It is harder to take a deep breath if you are having pain. AFTER USE Rest and breathe slowly and easily. It can be helpful to keep track of a log of your progress. Your caregiver can provide you with a simple table to help with this. If you are using the spirometer at home, follow these instructions: SEEK MEDICAL CARE IF:  You are having difficultly using the spirometer. You have trouble using the spirometer as often as instructed. Your pain medication is not giving enough relief while using the spirometer. You develop fever of 100.5 F (38.1 C) or higher. SEEK IMMEDIATE MEDICAL CARE IF:  You cough up bloody sputum that had not been present before. You develop fever of 102 F (38.9 C) or greater. You develop worsening pain at or near the incision site. MAKE SURE YOU:  Understand these instructions. Will watch your condition. Will get help right away if you are not doing well or get worse.   WHAT IS A BLOOD TRANSFUSION? Blood Transfusion Information  A transfusion is the replacement of blood or some of its parts. Blood is made up of multiple cells which provide different functions. Red blood  cells carry oxygen and are used for blood loss replacement. White blood cells fight against infection. Platelets control bleeding. Plasma helps clot blood. Other blood products are available for specialized needs, such as hemophilia or other clotting disorders. BEFORE THE TRANSFUSION  Who gives blood for transfusions?  Healthy volunteers who are fully evaluated to make sure their blood is safe. This is blood bank blood. Transfusion therapy is the safest it has ever been in the practice of medicine. Before blood is taken from a donor, a complete history is taken to make sure that person has no history of diseases nor engages in risky social behavior (examples  are intravenous drug use or sexual activity with multiple partners). The donor's travel history is screened to minimize risk of transmitting infections, such as malaria. The donated blood is tested for signs of infectious diseases, such as HIV and hepatitis. The blood is then tested to be sure it is compatible with you in order to minimize the chance of a transfusion reaction. If you or a relative donates blood, this is often done in anticipation of surgery and is not appropriate for emergency situations. It takes many days to process the donated blood. RISKS AND COMPLICATIONS Although transfusion therapy is very safe and saves many lives, the main dangers of transfusion include:  Getting an infectious disease. Developing a transfusion reaction. This is an allergic reaction to something in the blood you were given. Every precaution is taken to prevent this. The decision to have a blood transfusion has been considered carefully by your caregiver before blood is given. Blood is not given unless the benefits outweigh the risks. AFTER THE TRANSFUSION Right after receiving a blood transfusion, you will usually feel much better and more energetic. This is especially true if your red blood cells have gotten low (anemic). The transfusion raises the level  of the red blood cells which carry oxygen, and this usually causes an energy increase. The nurse administering the transfusion will monitor you carefully for complications. HOME CARE INSTRUCTIONS  No special instructions are needed after a transfusion. You may find your energy is better. Speak with your caregiver about any limitations on activity for underlying diseases you may have. SEEK MEDICAL CARE IF:  Your condition is not improving after your transfusion. You develop redness or irritation at the intravenous (IV) site. SEEK IMMEDIATE MEDICAL CARE IF:  Any of the following symptoms occur over the next 12 hours: Shaking chills. You have a temperature by mouth above 102 F (38.9 C), not controlled by medicine. Chest, back, or muscle pain. People around you feel you are not acting correctly or are confused. Shortness of breath or difficulty breathing. Dizziness and fainting. You get a rash or develop hives. You have a decrease in urine output. Your urine turns a dark color or changes to pink, red, or brown. Any of the following symptoms occur over the next 10 days: You have a temperature by mouth above 102 F (38.9 C), not controlled by medicine. Shortness of breath. Weakness after normal activity. The white part of the eye turns yellow (jaundice). You have a decrease in the amount of urine or are urinating less often. Your urine turns a dark color or changes to pink, red, or brown. Document Released: 07/06/2000 Document Revised: 10/01/2011 Document Reviewed: 02/23/2008 [facility] Patient Information 2014 [place], [place].

## 2023-05-21 ENCOUNTER — Encounter (HOSPITAL_COMMUNITY): Payer: Self-pay

## 2023-05-21 ENCOUNTER — Encounter (HOSPITAL_COMMUNITY)
Admission: RE | Admit: 2023-05-21 | Discharge: 2023-05-21 | Disposition: A | Discharge status: Home / Self Care | Payer: Medicare Other | Source: Ambulatory Visit | Attending: Orthopedic Surgery | Admitting: Orthopedic Surgery

## 2023-05-21 ENCOUNTER — Other Ambulatory Visit: Payer: Self-pay

## 2023-05-21 VITALS — BP 155/63 | HR 65 | Temp 97.5°F | Resp 16 | Ht 65.0 in | Wt 153.0 lb

## 2023-05-21 DIAGNOSIS — I10 Essential (primary) hypertension: Secondary | ICD-10-CM | POA: Insufficient documentation

## 2023-05-21 DIAGNOSIS — Z01812 Encounter for preprocedural laboratory examination: Secondary | ICD-10-CM | POA: Diagnosis not present

## 2023-05-21 DIAGNOSIS — Z01818 Encounter for other preprocedural examination: Secondary | ICD-10-CM | POA: Diagnosis present

## 2023-05-21 HISTORY — DX: Malignant (primary) neoplasm, unspecified: C80.1

## 2023-05-21 HISTORY — DX: Personal history of other diseases of the digestive system: Z87.19

## 2023-05-21 HISTORY — DX: Acute myocardial infarction, unspecified: I21.9

## 2023-05-21 LAB — CBC
HCT: 34.6 % — ABNORMAL LOW (ref 36.0–46.0)
Hemoglobin: 11.6 g/dL — ABNORMAL LOW (ref 12.0–15.0)
MCH: 30.9 pg (ref 26.0–34.0)
MCHC: 33.5 g/dL (ref 30.0–36.0)
MCV: 92 fL (ref 80.0–100.0)
Platelets: 203 10*3/uL (ref 150–400)
RBC: 3.76 MIL/uL — ABNORMAL LOW (ref 3.87–5.11)
RDW: 12.5 % (ref 11.5–15.5)
WBC: 5.3 10*3/uL (ref 4.0–10.5)
nRBC: 0 % (ref 0.0–0.2)

## 2023-05-21 LAB — BASIC METABOLIC PANEL
Anion gap: 8 (ref 5–15)
BUN: 11 mg/dL (ref 8–23)
CO2: 24 mmol/L (ref 22–32)
Calcium: 9 mg/dL (ref 8.9–10.3)
Chloride: 105 mmol/L (ref 98–111)
Creatinine, Ser: 0.7 mg/dL (ref 0.44–1.00)
GFR, Estimated: >60 mL/min (ref >60)
Glucose, Bld: 119 mg/dL — ABNORMAL HIGH (ref 70–99)
Potassium: 3.5 mmol/L (ref 3.5–5.1)
Sodium: 137 mmol/L (ref 135–145)

## 2023-05-21 LAB — SURGICAL PCR SCREEN
MRSA, PCR: NEGATIVE
Staphylococcus aureus: POSITIVE — AB

## 2023-05-22 NOTE — Progress Notes (Signed)
Please review preop PCR results from 05/21/23.

## 2023-05-23 NOTE — Progress Notes (Signed)
Anesthesia Chart Review   Case: [id number] Date/Time: 05/29/23 1240   Procedure: TOTAL HIP ARTHROPLASTY ANTERIOR APPROACH (Right: Hip)   Anesthesia type: Choice   Pre-op diagnosis: right hip osteoarthritis   Location: [facility] ROOM 09 / [facility]   Surgeons: Ollen Gross, MD       DISCUSSION:77 y.o. never smoker with h/o PONV, HTN, sleep apnea, NSTEMI, non-obstructive CAD, right hip OA scheduled for above procedure 05/29/2023 with Dr. Ollen Gross.   Pt follows with cardiology at [facility]  Pt was last seen 05/20/2023. Per OV note, "Stable, no angina. Recent symptoms likely GI related/acid reflux with relief after belching.  She is at increased cardiac risk for upcoming surgery but not prohibited from proceeding. Clearance form already signed by primary cardiologist. May hold Plavix for 5 days before procedure and also hold aspirin 7 days before procedure and resume the following day.  No medication changes today.  Again, overall doing well from a cardiac standpoint. No additional cardiac testing ordered today."  VS: BP (!) 155/63   Pulse 65   Temp (!) 36.4 C (Oral)   Resp 16   Ht 5\' 5"  (1.651 m)   Wt 69.4 kg   SpO2 97%   BMI 25.46 kg/m   PROVIDERS: Podraza, Rudy Jew, PA-C is PCP    LABS: Labs reviewed: Acceptable for surgery. (all labs ordered are listed, but only abnormal results are displayed)  Labs Reviewed  SURGICAL PCR SCREEN - Abnormal; Notable for the following components:      Result Value   Staphylococcus aureus POSITIVE (*)    All other components within normal limits  BASIC METABOLIC PANEL - Abnormal; Notable for the following components:   Glucose, Bld 119 (*)    All other components within normal limits  CBC - Abnormal; Notable for the following components:   RBC 3.76 (*)    Hemoglobin 11.6 (*)    HCT 34.6 (*)    All other components within normal limits  TYPE AND SCREEN     IMAGES:   EKG:   CV: CT Coronary  02/12/2022 FINDINGS: FFRct analysis was performed on the original cardiac CT angiogram dataset. Diagrammatic representation of the FFRct analysis is provided in a separate PDF document in PACS. This dictation was created using the PDF document and an interactive 3D model of the results. 3D model is not available in the EMR/PACS. Normal FFR range is >0.80.   1. Left Main:  No significant stenosis. FFR = 0.99   2. LAD: No significant stenosis. Proximal FFR = 0.96, Mid FFR = 0.88, Distal FFR = 0.79 3. LCX: No significant stenosis. Proximal FFR = 0.98, Mid FFR = 0.94, Distal FFR = 0.84 4. RCA (non-dominant): No significant stenosis. Proximal FFR = 0.96   IMPRESSION: 1.  CT FFR analysis did not show any significant stenosis.  Echo 02/11/2022 1. Left ventricular ejection fraction, by estimation, is 60 to 65%. The  left ventricle has normal function. The left ventricle has no regional  wall motion abnormalities. There is mild left ventricular hypertrophy.  Left ventricular diastolic parameters  are consistent with Grade I diastolic dysfunction (impaired relaxation).   2. Right ventricular systolic function is normal. The right ventricular  size is normal. There is mildly elevated pulmonary artery systolic  pressure. The estimated right ventricular systolic pressure is 36.5 mmHg.   3. Left atrial size was mildly dilated.   4. The mitral valve is grossly normal. Trivial mitral valve  regurgitation. No evidence  of mitral stenosis.   5. The aortic valve is grossly normal. There is mild calcification of the  aortic valve. Aortic valve regurgitation is mild. Aortic valve  sclerosis/calcification is present, without any evidence of aortic  stenosis.   6. The inferior vena cava is normal in size with <50% respiratory  variability, suggesting right atrial pressure of 8 mmHg.  Past Medical History:  Diagnosis Date   Arthritis    knees, back.   Asthma    2 weeks cold,no problems now-well  controlled   Bursitis, knee    Bil. Hips, not knee   Cancer (HCC)    Complication of anesthesia    slow to wake up   Diverticulitis of colon 05/28/2011   no current problems   GERD (gastroesophageal reflux disease)    tx. pantoprazole   History of hiatal hernia    Hypertension    Multiple thyroid nodules    Myocardial infarction (HCC)    Pneumonia    PONV (postoperative nausea and vomiting)    Sleep apnea    No cpap now -3 months last due to insurance reasons    Past Surgical History:  Procedure Laterality Date   ABDOMINAL HYSTERECTOMY     partial hyst   BREAST BIOPSY     several- all benign   CARDIAC CATHETERIZATION     10'11   CERVICAL DISC SURGERY     1977   FOOT SURGERY     Bilateral   KNEE ARTHROSCOPY     right '04   TOTAL KNEE ARTHROPLASTY  06/04/2011   Procedure: TOTAL KNEE ARTHROPLASTY;  Surgeon: Loanne Drilling;  Location: [facility];  Service: Orthopedics;  Laterality: Right;   TOTAL KNEE ARTHROPLASTY Left 08/08/2015   Procedure: TOTAL LEFT KNEE ARTHROPLASTY;  Surgeon: Ollen Gross, MD;  Location: [facility];  Service: Orthopedics;  Laterality: Left;    MEDICATIONS:  acetaminophen (TYLENOL) 500 MG tablet   albuterol (VENTOLIN HFA) 108 (90 Base) MCG/ACT inhaler   aspirin EC 81 MG tablet   clopidogrel (PLAVIX) 75 MG tablet   estradiol (ESTRACE) 2 MG tablet   ezetimibe (ZETIA) 10 MG tablet   famotidine (PEPCID) 20 MG tablet   fluticasone (FLONASE) 50 MCG/ACT nasal spray   fluticasone furoate-vilanterol (BREO ELLIPTA) 100-25 MCG/INH AEPB   gabapentin (NEURONTIN) 100 MG capsule   guaiFENesin (MUCINEX) 600 MG 12 hr tablet   latanoprost (XALATAN) 0.005 % ophthalmic solution   magnesium oxide (MAG-OX) 400 (240 Mg) MG tablet   metoprolol succinate (TOPROL-XL) 50 MG 24 hr tablet   montelukast (SINGULAIR) 10 MG tablet   RABEprazole (ACIPHEX) 20 MG tablet   rosuvastatin (CRESTOR) 40 MG tablet   spironolactone (ALDACTONE) 25 MG tablet   telmisartan (MICARDIS) 20 MG  tablet   traMADol (ULTRAM) 50 MG tablet   Vitamin D, Ergocalciferol, (DRISDOL) 1.25 MG (50000 UNIT) CAPS capsule   No current facility-administered medications for this encounter.     Jodell Cipro Ward, PA-C [facility] [phone]

## 2023-05-23 NOTE — Anesthesia Preprocedure Evaluation (Addendum)
Anesthesia Evaluation  Patient identified by MRN, date of birth, ID band Patient awake    Reviewed: Allergy & Precautions, NPO status , Patient's Chart, lab work & pertinent test results, reviewed documented beta blocker date and time   History of Anesthesia Complications (+) PONV, PROLONGED EMERGENCE and history of anesthetic complications  Airway Mallampati: II  TM Distance: >3 FB Neck ROM: Full    Dental  (+) Dental Advisory Given, Partial Lower, Partial Upper   Pulmonary asthma , sleep apnea    Pulmonary exam normal        Cardiovascular hypertension, Pt. on home beta blockers and Pt. on medications Normal cardiovascular exam   '23 Coronary FFR - IMPRESSION: 1.  CT FFR analysis did not show any significant stenosis.  '23 TTE - EF 60 to 65%. There is mild left ventricular hypertrophy. Grade I diastolic dysfunction (impaired relaxation). There is mildly elevated pulmonary artery systolic pressure. Left atrial size was mildly dilated. Trivial mitral valve regurgitation. Aortic valve regurgitation is mild.     Neuro/Psych negative neurological ROS  negative psych ROS   GI/Hepatic Neg liver ROS, hiatal hernia,GERD  Controlled and Medicated,,  Endo/Other  negative endocrine ROS    Renal/GU negative Renal ROS     Musculoskeletal  (+) Arthritis ,    Abdominal   Peds  Hematology  (+) Blood dyscrasia, anemia  Plt 203k On plavix, last dose >5 days ago     Anesthesia Other Findings   Reproductive/Obstetrics                             Anesthesia Physical Anesthesia Plan  ASA: 3  Anesthesia Plan: Spinal   Post-op Pain Management: Tylenol PO (pre-op)*   Induction:   PONV Risk Score and Plan: 3 and Treatment may vary due to age or medical condition and Propofol infusion  Airway Management Planned: Natural Airway and Simple Face Mask  Additional Equipment: None  Intra-op Plan:    Post-operative Plan:   Informed Consent: I have reviewed the patients History and Physical, chart, labs and discussed the procedure including the risks, benefits and alternatives for the proposed anesthesia with the patient or authorized representative who has indicated his/her understanding and acceptance.       Plan Discussed with: CRNA and Anesthesiologist  Anesthesia Plan Comments: (Labs reviewed, platelets acceptable. Discussed risks and benefits of spinal, including spinal/epidural hematoma, infection, failed block, and PDPH. Patient expressed understanding and wished to proceed. See PAT note)       Anesthesia Quick Evaluation

## 2023-05-29 ENCOUNTER — Encounter (HOSPITAL_COMMUNITY)
Admission: RE | Disposition: A | Discharge status: Home / Self Care | Payer: Self-pay | Source: Ambulatory Visit | Attending: Orthopedic Surgery

## 2023-05-29 ENCOUNTER — Other Ambulatory Visit: Payer: Self-pay

## 2023-05-29 ENCOUNTER — Ambulatory Visit (HOSPITAL_COMMUNITY): Payer: Medicare Other

## 2023-05-29 ENCOUNTER — Encounter (HOSPITAL_COMMUNITY): Payer: Self-pay | Admitting: Orthopedic Surgery

## 2023-05-29 ENCOUNTER — Observation Stay (HOSPITAL_COMMUNITY): Payer: Medicare Other

## 2023-05-29 ENCOUNTER — Ambulatory Visit (HOSPITAL_COMMUNITY): Payer: Medicare Other | Admitting: Physician Assistant

## 2023-05-29 ENCOUNTER — Observation Stay (HOSPITAL_COMMUNITY)
Admission: RE | Admit: 2023-05-29 | Discharge: 2023-05-31 | Disposition: A | Discharge status: Home / Self Care | Payer: Medicare Other | Source: Ambulatory Visit | Attending: Orthopedic Surgery | Admitting: Orthopedic Surgery

## 2023-05-29 ENCOUNTER — Ambulatory Visit (HOSPITAL_BASED_OUTPATIENT_CLINIC_OR_DEPARTMENT_OTHER): Payer: Medicare Other | Admitting: Anesthesiology

## 2023-05-29 DIAGNOSIS — M1611 Unilateral primary osteoarthritis, right hip: Secondary | ICD-10-CM

## 2023-05-29 DIAGNOSIS — Z7982 Long term (current) use of aspirin: Secondary | ICD-10-CM | POA: Insufficient documentation

## 2023-05-29 DIAGNOSIS — Z96653 Presence of artificial knee joint, bilateral: Secondary | ICD-10-CM | POA: Insufficient documentation

## 2023-05-29 DIAGNOSIS — I10 Essential (primary) hypertension: Secondary | ICD-10-CM | POA: Diagnosis not present

## 2023-05-29 DIAGNOSIS — J45909 Unspecified asthma, uncomplicated: Secondary | ICD-10-CM | POA: Diagnosis not present

## 2023-05-29 DIAGNOSIS — Z79899 Other long term (current) drug therapy: Secondary | ICD-10-CM | POA: Diagnosis not present

## 2023-05-29 DIAGNOSIS — M169 Osteoarthritis of hip, unspecified: Secondary | ICD-10-CM | POA: Diagnosis present

## 2023-05-29 HISTORY — PX: TOTAL HIP ARTHROPLASTY: SHX124

## 2023-05-29 LAB — TYPE AND SCREEN
ABO/RH(D): AB POS
Antibody Screen: NEGATIVE

## 2023-05-29 SURGERY — ARTHROPLASTY, HIP, TOTAL, ANTERIOR APPROACH
Anesthesia: Spinal | Site: Hip | Laterality: Right

## 2023-05-29 MED ORDER — ONDANSETRON HCL 4 MG/2ML IJ SOLN
INTRAMUSCULAR | Status: DC | PRN
  Administered 2023-05-29: 4 mg via INTRAVENOUS

## 2023-05-29 MED ORDER — FENTANYL CITRATE (PF) 100 MCG/2ML IJ SOLN
INTRAMUSCULAR | Status: DC | PRN
  Administered 2023-05-29: 50 ug via INTRAVENOUS
  Administered 2023-05-29 (×2): 25 ug via INTRAVENOUS

## 2023-05-29 MED ORDER — PHENOL 1.4 % MT LIQD: 1.0000 | OROMUCOSAL | Status: DC | PRN

## 2023-05-29 MED ORDER — ONDANSETRON HCL 4 MG/2ML IJ SOLN
4.0000 mg | Freq: Once | INTRAMUSCULAR | Status: AC | PRN
  Administered 2023-05-29: 4 mg via INTRAVENOUS

## 2023-05-29 MED ORDER — ASPIRIN 325 MG PO TBEC
325.0000 mg | DELAYED_RELEASE_TABLET | Freq: Every day | ORAL | Status: DC
  Administered 2023-05-30 – 2023-05-31 (×2): 325 mg via ORAL
  Filled 2023-05-29 (×2): qty 1

## 2023-05-29 MED ORDER — MENTHOL 3 MG MT LOZG: 1.0000 | LOZENGE | OROMUCOSAL | Status: DC | PRN

## 2023-05-29 MED ORDER — BISACODYL 10 MG RE SUPP: 10.0000 mg | Freq: Every day | RECTAL | Status: DC | PRN

## 2023-05-29 MED ORDER — ACETAMINOPHEN 500 MG PO TABS
1000.0000 mg | ORAL_TABLET | Freq: Once | ORAL | Status: AC
  Administered 2023-05-29: 1000 mg via ORAL
  Filled 2023-05-29: qty 2

## 2023-05-29 MED ORDER — METOCLOPRAMIDE HCL 5 MG/ML IJ SOLN: 5.0000 mg | Freq: Three times a day (TID) | INTRAMUSCULAR | Status: DC | PRN

## 2023-05-29 MED ORDER — DIPHENHYDRAMINE HCL 12.5 MG/5ML PO ELIX: 12.5000 mg | ORAL_SOLUTION | ORAL | Status: DC | PRN

## 2023-05-29 MED ORDER — LACTATED RINGERS IV SOLN: INTRAVENOUS | Status: DC | PRN

## 2023-05-29 MED ORDER — PHENYLEPHRINE HCL-NACL 20-0.9 MG/250ML-% IV SOLN
INTRAVENOUS | Status: AC
  Filled 2023-05-29: qty 250

## 2023-05-29 MED ORDER — GABAPENTIN 100 MG PO CAPS: 100.0000 mg | ORAL_CAPSULE | Freq: Two times a day (BID) | ORAL | Status: DC | PRN

## 2023-05-29 MED ORDER — TRAMADOL HCL 50 MG PO TABS
50.0000 mg | ORAL_TABLET | Freq: Four times a day (QID) | ORAL | Status: DC | PRN
Start: 2023-05-29 — End: 2023-05-31
  Administered 2023-05-29 – 2023-05-31 (×5): 100 mg via ORAL
  Filled 2023-05-29 (×5): qty 2

## 2023-05-29 MED ORDER — CEFAZOLIN SODIUM-DEXTROSE 2-4 GM/100ML-% IV SOLN
2.0000 g | Freq: Four times a day (QID) | INTRAVENOUS | Status: AC
  Administered 2023-05-29 (×2): 2 g via INTRAVENOUS
  Filled 2023-05-29 (×2): qty 100

## 2023-05-29 MED ORDER — BUPIVACAINE-EPINEPHRINE 0.25% -1:200000 IJ SOLN
INTRAMUSCULAR | Status: AC
Start: 2023-05-29 — End: ?
  Filled 2023-05-29: qty 1

## 2023-05-29 MED ORDER — SPIRONOLACTONE 25 MG PO TABS
25.0000 mg | ORAL_TABLET | Freq: Every day | ORAL | Status: DC
  Administered 2023-05-30 – 2023-05-31 (×2): 25 mg via ORAL
  Filled 2023-05-29 (×2): qty 1

## 2023-05-29 MED ORDER — ACETAMINOPHEN 500 MG PO TABS
1000.0000 mg | ORAL_TABLET | Freq: Four times a day (QID) | ORAL | Status: AC
  Administered 2023-05-29 – 2023-05-30 (×3): 1000 mg via ORAL
  Filled 2023-05-29 (×3): qty 2

## 2023-05-29 MED ORDER — FENTANYL CITRATE PF 50 MCG/ML IJ SOSY
25.0000 ug | PREFILLED_SYRINGE | INTRAMUSCULAR | Status: DC | PRN
  Administered 2023-05-29: 25 ug via INTRAVENOUS

## 2023-05-29 MED ORDER — FLUTICASONE FUROATE-VILANTEROL 100-25 MCG/ACT IN AEPB: 1.0000 | INHALATION_SPRAY | Freq: Every day | RESPIRATORY_TRACT | Status: DC | PRN

## 2023-05-29 MED ORDER — 0.9 % SODIUM CHLORIDE (POUR BTL) OPTIME
TOPICAL | Status: DC | PRN
  Administered 2023-05-29: 1000 mL

## 2023-05-29 MED ORDER — ROSUVASTATIN CALCIUM 20 MG PO TABS
40.0000 mg | ORAL_TABLET | Freq: Every day | ORAL | Status: DC
  Filled 2023-05-29: qty 2

## 2023-05-29 MED ORDER — GABAPENTIN 100 MG PO CAPS
100.0000 mg | ORAL_CAPSULE | Freq: Two times a day (BID) | ORAL | Status: DC | PRN
  Administered 2023-05-31: 100 mg via ORAL
  Filled 2023-05-29 (×2): qty 1

## 2023-05-29 MED ORDER — TRANEXAMIC ACID-NACL 1000-0.7 MG/100ML-% IV SOLN
1000.0000 mg | INTRAVENOUS | Status: AC
  Administered 2023-05-29: 1000 mg via INTRAVENOUS
  Filled 2023-05-29: qty 100

## 2023-05-29 MED ORDER — ORAL CARE MOUTH RINSE: 15.0000 mL | Freq: Once | OROMUCOSAL | Status: AC

## 2023-05-29 MED ORDER — ONDANSETRON HCL 4 MG/2ML IJ SOLN: 4.0000 mg | Freq: Four times a day (QID) | INTRAMUSCULAR | Status: DC | PRN

## 2023-05-29 MED ORDER — PROPOFOL 500 MG/50ML IV EMUL
INTRAVENOUS | Status: DC | PRN
  Administered 2023-05-29: 85 ug/kg/min via INTRAVENOUS

## 2023-05-29 MED ORDER — FENTANYL CITRATE PF 50 MCG/ML IJ SOSY
PREFILLED_SYRINGE | INTRAMUSCULAR | Status: AC
  Administered 2023-05-29: 25 ug via INTRAVENOUS
  Filled 2023-05-29: qty 2

## 2023-05-29 MED ORDER — DOCUSATE SODIUM 100 MG PO CAPS
100.0000 mg | ORAL_CAPSULE | Freq: Two times a day (BID) | ORAL | Status: DC
  Administered 2023-05-29 – 2023-05-31 (×4): 100 mg via ORAL
  Filled 2023-05-29 (×4): qty 1

## 2023-05-29 MED ORDER — METOCLOPRAMIDE HCL 5 MG PO TABS: 5.0000 mg | ORAL_TABLET | Freq: Three times a day (TID) | ORAL | Status: DC | PRN

## 2023-05-29 MED ORDER — METOPROLOL SUCCINATE ER 50 MG PO TB24
50.0000 mg | ORAL_TABLET | Freq: Every day | ORAL | Status: DC
  Administered 2023-05-30 – 2023-05-31 (×2): 50 mg via ORAL
  Filled 2023-05-29 (×2): qty 1

## 2023-05-29 MED ORDER — METHOCARBAMOL 500 MG PO TABS: 500.0000 mg | ORAL_TABLET | Freq: Four times a day (QID) | ORAL | Status: DC | PRN

## 2023-05-29 MED ORDER — DEXAMETHASONE SODIUM PHOSPHATE 10 MG/ML IJ SOLN
10.0000 mg | Freq: Once | INTRAMUSCULAR | Status: AC
  Administered 2023-05-30: 10 mg via INTRAVENOUS
  Filled 2023-05-29: qty 1

## 2023-05-29 MED ORDER — DEXAMETHASONE SODIUM PHOSPHATE 10 MG/ML IJ SOLN
8.0000 mg | Freq: Once | INTRAMUSCULAR | Status: AC
  Administered 2023-05-29: 10 mg via INTRAVENOUS

## 2023-05-29 MED ORDER — POLYETHYLENE GLYCOL 3350 17 G PO PACK: 17.0000 g | PACK | Freq: Every day | ORAL | Status: DC | PRN

## 2023-05-29 MED ORDER — FAMOTIDINE 20 MG PO TABS
20.0000 mg | ORAL_TABLET | Freq: Two times a day (BID) | ORAL | Status: DC
Start: 2023-05-29 — End: 2023-05-31
  Administered 2023-05-29 – 2023-05-31 (×4): 20 mg via ORAL
  Filled 2023-05-29 (×4): qty 1

## 2023-05-29 MED ORDER — CEFAZOLIN SODIUM-DEXTROSE 2-4 GM/100ML-% IV SOLN
2.0000 g | INTRAVENOUS | Status: AC
  Administered 2023-05-29: 2 g via INTRAVENOUS
  Filled 2023-05-29: qty 100

## 2023-05-29 MED ORDER — AMISULPRIDE (ANTIEMETIC) 5 MG/2ML IV SOLN
INTRAVENOUS | Status: AC
  Filled 2023-05-29: qty 4

## 2023-05-29 MED ORDER — ACETAMINOPHEN 325 MG PO TABS
325.0000 mg | ORAL_TABLET | Freq: Four times a day (QID) | ORAL | Status: DC | PRN
  Administered 2023-05-30 – 2023-05-31 (×3): 650 mg via ORAL
  Filled 2023-05-29 (×3): qty 2

## 2023-05-29 MED ORDER — ESTRADIOL 0.5 MG PO TABS
2.0000 mg | ORAL_TABLET | Freq: Every day | ORAL | Status: DC
Start: 2023-05-30 — End: 2023-05-31
  Administered 2023-05-30 – 2023-05-31 (×2): 2 mg via ORAL
  Filled 2023-05-29 (×2): qty 4

## 2023-05-29 MED ORDER — MONTELUKAST SODIUM 10 MG PO TABS
10.0000 mg | ORAL_TABLET | Freq: Every day | ORAL | Status: DC
  Administered 2023-05-30: 10 mg via ORAL
  Filled 2023-05-29: qty 1

## 2023-05-29 MED ORDER — CLOPIDOGREL BISULFATE 75 MG PO TABS
75.0000 mg | ORAL_TABLET | Freq: Every day | ORAL | Status: DC
Start: 2023-05-30 — End: 2023-05-31
  Administered 2023-05-30 – 2023-05-31 (×2): 75 mg via ORAL
  Filled 2023-05-29 (×2): qty 1

## 2023-05-29 MED ORDER — AMISULPRIDE (ANTIEMETIC) 5 MG/2ML IV SOLN
10.0000 mg | Freq: Once | INTRAVENOUS | Status: AC
  Administered 2023-05-29: 10 mg via INTRAVENOUS

## 2023-05-29 MED ORDER — OXYCODONE HCL 5 MG/5ML PO SOLN: 5.0000 mg | Freq: Once | ORAL | Status: DC | PRN

## 2023-05-29 MED ORDER — METHOCARBAMOL 1000 MG/10ML IJ SOLN
500.0000 mg | Freq: Four times a day (QID) | INTRAMUSCULAR | Status: DC | PRN
  Administered 2023-05-29 – 2023-05-31 (×5): 500 mg via INTRAVENOUS
  Filled 2023-05-29 (×5): qty 10

## 2023-05-29 MED ORDER — PROPOFOL 1000 MG/100ML IV EMUL
INTRAVENOUS | Status: AC
  Filled 2023-05-29: qty 100

## 2023-05-29 MED ORDER — FLUTICASONE PROPIONATE 50 MCG/ACT NA SUSP: 1.0000 | Freq: Every day | NASAL | Status: DC | PRN

## 2023-05-29 MED ORDER — OXYCODONE HCL 5 MG PO TABS
5.0000 mg | ORAL_TABLET | Freq: Once | ORAL | Status: DC | PRN
Start: 2023-05-29 — End: 2023-05-29

## 2023-05-29 MED ORDER — WATER FOR IRRIGATION, STERILE IR SOLN
Status: DC | PRN
  Administered 2023-05-29: 1000 mL

## 2023-05-29 MED ORDER — BUPIVACAINE-EPINEPHRINE (PF) 0.25% -1:200000 IJ SOLN
INTRAMUSCULAR | Status: DC | PRN
  Administered 2023-05-29: 30 mL

## 2023-05-29 MED ORDER — ONDANSETRON HCL 4 MG PO TABS: 4.0000 mg | ORAL_TABLET | Freq: Four times a day (QID) | ORAL | Status: DC | PRN

## 2023-05-29 MED ORDER — FENTANYL CITRATE (PF) 100 MCG/2ML IJ SOLN
INTRAMUSCULAR | Status: AC
  Filled 2023-05-29: qty 2

## 2023-05-29 MED ORDER — BUPIVACAINE IN DEXTROSE 0.75-8.25 % IT SOLN
INTRATHECAL | Status: DC | PRN
Start: 2023-05-29 — End: 2023-05-29
  Administered 2023-05-29: 1.6 mL via INTRATHECAL

## 2023-05-29 MED ORDER — ALBUTEROL SULFATE (2.5 MG/3ML) 0.083% IN NEBU: 3.0000 mL | INHALATION_SOLUTION | Freq: Four times a day (QID) | RESPIRATORY_TRACT | Status: DC | PRN

## 2023-05-29 MED ORDER — ONDANSETRON HCL 4 MG/2ML IJ SOLN
INTRAMUSCULAR | Status: AC
  Filled 2023-05-29: qty 2

## 2023-05-29 MED ORDER — ORAL CARE MOUTH RINSE: 15.0000 mL | OROMUCOSAL | Status: DC | PRN

## 2023-05-29 MED ORDER — PANTOPRAZOLE SODIUM 40 MG PO TBEC
40.0000 mg | DELAYED_RELEASE_TABLET | Freq: Every day | ORAL | Status: DC
  Administered 2023-05-30 – 2023-05-31 (×2): 40 mg via ORAL
  Filled 2023-05-29 (×2): qty 1

## 2023-05-29 MED ORDER — CHLORHEXIDINE GLUCONATE 0.12 % MT SOLN
15.0000 mL | Freq: Once | OROMUCOSAL | Status: AC
  Administered 2023-05-29: 15 mL via OROMUCOSAL

## 2023-05-29 MED ORDER — EZETIMIBE 10 MG PO TABS
10.0000 mg | ORAL_TABLET | Freq: Every evening | ORAL | Status: DC
  Administered 2023-05-30: 10 mg via ORAL
  Filled 2023-05-29: qty 1

## 2023-05-29 MED ORDER — SODIUM CHLORIDE 0.9 % IV SOLN: INTRAVENOUS | Status: DC

## 2023-05-29 MED ORDER — FLEET ENEMA RE ENEM: 1.0000 | ENEMA | Freq: Once | RECTAL | Status: DC | PRN

## 2023-05-29 MED ORDER — LACTATED RINGERS IV SOLN: INTRAVENOUS | Status: DC

## 2023-05-29 MED ORDER — POVIDONE-IODINE 10 % EX SWAB
2.0000 | Freq: Once | CUTANEOUS | Status: AC
  Administered 2023-05-29: 2 via TOPICAL

## 2023-05-29 MED ORDER — PHENYLEPHRINE HCL-NACL 20-0.9 MG/250ML-% IV SOLN
INTRAVENOUS | Status: DC | PRN
  Administered 2023-05-29: 30 ug/min via INTRAVENOUS

## 2023-05-29 SURGICAL SUPPLY — 43 items
ADH SKN CLS APL DERMABOND .7 (GAUZE/BANDAGES/DRESSINGS) ×1
BAG COUNTER SPONGE SURGICOUNT (BAG) IMPLANT
BAG SPEC THK2 15X12 ZIP CLS (MISCELLANEOUS)
BAG SPNG CNTER NS LX DISP (BAG)
BAG ZIPLOCK 12X15 (MISCELLANEOUS) IMPLANT
BLADE SAG 18X100X1.27 (BLADE) ×2 IMPLANT
COVER PERINEAL POST (MISCELLANEOUS) ×2 IMPLANT
COVER SURGICAL LIGHT HANDLE (MISCELLANEOUS) ×2 IMPLANT
CUP ACET PINNACLE SECTR 50MM (Hips) IMPLANT
DERMABOND ADVANCED .7 DNX12 (GAUZE/BANDAGES/DRESSINGS) ×2 IMPLANT
DRAPE FOOT SWITCH (DRAPES) ×2 IMPLANT
DRAPE STERI IOBAN 125X83 (DRAPES) ×2 IMPLANT
DRAPE U-SHAPE 47X51 STRL (DRAPES) ×4 IMPLANT
DRESSING AQUACEL AG SP 3.5X10 (GAUZE/BANDAGES/DRESSINGS) IMPLANT
DRSG AQUACEL AG ADV 3.5X10 (GAUZE/BANDAGES/DRESSINGS) ×2 IMPLANT
DRSG AQUACEL AG SP 3.5X10 (GAUZE/BANDAGES/DRESSINGS) ×1
DURAPREP 26ML APPLICATOR (WOUND CARE) ×2 IMPLANT
ELECT REM PT RETURN 15FT ADLT (MISCELLANEOUS) ×2 IMPLANT
GLOVE BIO SURGEON STRL SZ 6.5 (GLOVE) IMPLANT
GLOVE BIO SURGEON STRL SZ8 (GLOVE) ×2 IMPLANT
GLOVE BIOGEL PI IND STRL 6.5 (GLOVE) IMPLANT
GLOVE BIOGEL PI IND STRL 7.0 (GLOVE) IMPLANT
GLOVE BIOGEL PI IND STRL 8 (GLOVE) ×2 IMPLANT
GOWN STRL REUS W/ TWL LRG LVL3 (GOWN DISPOSABLE) ×2 IMPLANT
GOWN STRL REUS W/TWL LRG LVL3 (GOWN DISPOSABLE) ×1
HEAD FEM STD 32X+1 STRL (Hips) IMPLANT
HOLDER FOLEY CATH W/STRAP (MISCELLANEOUS) ×2 IMPLANT
KIT TURNOVER KIT A (KITS) IMPLANT
LINER MARATHON 32 50 (Hips) IMPLANT
MANIFOLD NEPTUNE II (INSTRUMENTS) ×2 IMPLANT
PACK ANTERIOR HIP CUSTOM (KITS) ×2 IMPLANT
PENCIL SMOKE EVACUATOR COATED (MISCELLANEOUS) ×2 IMPLANT
PINNACLE SECTOR CUP 50MM (Hips) ×1 IMPLANT
SPIKE FLUID TRANSFER (MISCELLANEOUS) ×2 IMPLANT
STEM FEM ACTIS STD SZ4 (Stem) IMPLANT
SUT ETHIBOND NAB CT1 #1 30IN (SUTURE) ×2 IMPLANT
SUT MNCRL AB 4-0 PS2 18 (SUTURE) ×2 IMPLANT
SUT STRATAFIX 0 PDS 27 VIOLET (SUTURE) ×1
SUT VIC AB 2-0 CT1 27 (SUTURE) ×2
SUT VIC AB 2-0 CT1 TAPERPNT 27 (SUTURE) ×4 IMPLANT
SUTURE STRATFX 0 PDS 27 VIOLET (SUTURE) ×2 IMPLANT
TRAY FOLEY MTR SLVR 16FR STAT (SET/KITS/TRAYS/PACK) ×2 IMPLANT
TUBE SUCTION HIGH CAP CLEAR NV (SUCTIONS) ×2 IMPLANT

## 2023-05-29 NOTE — Anesthesia Procedure Notes (Signed)
Spinal  Patient location during procedure: OR Start time: 05/29/2023 8:30 AM End time: 05/29/2023 8:33 AM Reason for block: surgical anesthesia Staffing Performed: anesthesiologist  Anesthesiologist: Beryle Lathe, MD Performed by: Beryle Lathe, MD Authorized by: Beryle Lathe, MD   Preanesthetic Checklist Completed: patient identified, IV checked, risks and benefits discussed, surgical consent, monitors and equipment checked, pre-op evaluation and timeout performed Spinal Block Patient position: sitting Prep: DuraPrep Patient monitoring: heart rate, cardiac monitor, continuous pulse ox and blood pressure Approach: midline Location: L3-4 Injection technique: single-shot Needle Needle type: Pencan  Needle gauge: 24 G Additional Notes Consent was obtained prior to the procedure with all questions answered and concerns addressed. Risks including, but not limited to, bleeding, infection, nerve damage, paralysis, failed block, inadequate analgesia, allergic reaction, high spinal, itching, and headache were discussed and the patient wished to proceed. Functioning IV was confirmed and monitors were applied. Sterile prep and drape, including hand hygiene, mask, and sterile gloves were used. The patient was positioned and the spine was prepped. The skin was anesthetized with lidocaine. Free flow of clear CSF was obtained prior to injecting local anesthetic into the CSF. The spinal needle aspirated freely following injection. The needle was carefully withdrawn. The patient tolerated the procedure well.   Leslye Peer, MD

## 2023-05-29 NOTE — Discharge Instructions (Addendum)
Dawn Gross, MD Total Joint Specialist [facility] [address], [address] [place], [place] [address] [phone]  ANTERIOR APPROACH TOTAL HIP REPLACEMENT POSTOPERATIVE DIRECTIONS     Hip Rehabilitation, Guidelines Following Surgery  The results of a hip operation are greatly improved after range of motion and muscle strengthening exercises. Follow all safety measures which are given to protect your hip. If any of these exercises cause increased pain or swelling in your joint, decrease the amount until you are comfortable again. Then slowly increase the exercises. Call your caregiver if you have problems or questions.   BLOOD CLOT PREVENTION In addition to your plavix, take a 325 mg Aspirin once a day for three weeks following surgery. Then resume an 81 mg Aspirin once a day You may resume your vitamins/supplements upon discharge from the hospital.  HOME CARE INSTRUCTIONS  Remove items at home which could result in a fall. This includes throw rugs or furniture in walking pathways.  ICE to the affected hip as frequently as 20-30 minutes an hour and then as needed for pain and swelling. Continue to use ice on the hip for pain and swelling from surgery. You may notice swelling that will progress down to the foot and ankle. This is normal after surgery. Elevate the leg when you are not up walking on it.   Continue to use the breathing machine which will help keep your temperature down.  It is common for your temperature to cycle up and down following surgery, especially at night when you are not up moving around and exerting yourself.  The breathing machine keeps your lungs expanded and your temperature down.  DIET You may resume your previous home diet once your are discharged from the hospital.  DRESSING / WOUND CARE / SHOWERING You have an adhesive waterproof bandage over the incision. Leave this in place until your first follow-up appointment. Once you remove this  you will not need to place another bandage.  You may begin showering 3 days following surgery, but do not submerge the incision under water.  ACTIVITY For the first 3-5 days, it is important to rest and keep the operative leg elevated. You should, as a general rule, rest for 50 minutes and walk/stretch for 10 minutes per hour. After 5 days, you may slowly increase activity as tolerated.  Perform the exercises you were provided twice a day for about 15-20 minutes each session. Begin these 2 days following surgery. Walk with your walker as instructed. Use the walker until you are comfortable transitioning to a cane. Walk with the cane in the opposite hand of the operative leg. You may discontinue the cane once you are comfortable and walking steadily. Avoid periods of inactivity such as sitting longer than an hour when not asleep. This helps prevent blood clots.  Do not drive a car for 6 weeks or until released by your surgeon.  Do not drive while taking narcotics.  TED HOSE STOCKINGS Wear the elastic stockings on both legs for three weeks following surgery during the day. You may remove them at night while sleeping.  WEIGHT BEARING Weight bearing as tolerated with assist device (walker, cane, etc) as directed, use it as long as suggested by your surgeon or therapist, typically at least 4-6 weeks.  POSTOPERATIVE CONSTIPATION PROTOCOL Constipation - defined medically as fewer than three stools per week and severe constipation as less than one stool per week.  One of the most common issues patients have following surgery is constipation.  Even if you have a regular bowel pattern at home, your normal regimen is likely to be disrupted due to multiple reasons following surgery.  Combination of anesthesia, postoperative narcotics, change in appetite and fluid intake all can affect your bowels.  In order to avoid complications following surgery, here are some recommendations in order to help you during  your recovery period.  Colace (docusate) - Pick up an over-the-counter form of Colace or another stool softener and take twice a day as long as you are requiring postoperative pain medications.  Take with a full glass of water daily.  If you experience loose stools or diarrhea, hold the colace until you stool forms back up.  If your symptoms do not get better within 1 week or if they get worse, check with your doctor. Dulcolax (bisacodyl) - Pick up over-the-counter and take as directed by the product packaging as needed to assist with the movement of your bowels.  Take with a full glass of water.  Use this product as needed if not relieved by Colace only.  MiraLax (polyethylene glycol) - Pick up over-the-counter to have on hand.  MiraLax is a solution that will increase the amount of water in your bowels to assist with bowel movements.  Take as directed and can mix with a glass of water, juice, soda, coffee, or tea.  Take if you go more than two days without a movement.Do not use MiraLax more than once per day. Call your doctor if you are still constipated or irregular after using this medication for 7 days in a row.  If you continue to have problems with postoperative constipation, please contact the office for further assistance and recommendations.  If you experience "the worst abdominal pain ever" or develop nausea or vomiting, please contact the office immediatly for further recommendations for treatment.  ITCHING  If you experience itching with your medications, try taking only a single pain pill, or even half a pain pill at a time.  You can also use Benadryl over the counter for itching or also to help with sleep.   MEDICATIONS See your medication summary on the "After Visit Summary" that the nursing staff will review with you prior to discharge.  You may have some home medications which will be placed on hold until you complete the course of blood thinner medication.  It is important for you to  complete the blood thinner medication as prescribed by your surgeon.  Continue your approved medications as instructed at time of discharge.  PRECAUTIONS If you experience chest pain or shortness of breath - call 911 immediately for transfer to the hospital emergency department.  If you develop a fever greater that 101 F, purulent drainage from wound, increased redness or drainage from wound, foul odor from the wound/dressing, or calf pain - CONTACT YOUR SURGEON.                                                   FOLLOW-UP APPOINTMENTS Make sure you keep all of your appointments after your operation with your surgeon and caregivers. You should call the office at the above phone number and make an appointment for approximately two weeks after the date of your surgery or on the date instructed by your surgeon outlined in the "After Visit Summary".  RANGE OF MOTION AND STRENGTHENING EXERCISES  These  exercises are designed to help you keep full movement of your hip joint. Follow your caregiver's or physical therapist's instructions. Perform all exercises about fifteen times, three times per day or as directed. Exercise both hips, even if you have had only one joint replacement. These exercises can be done on a training (exercise) mat, on the floor, on a table or on a bed. Use whatever works the best and is most comfortable for you. Use music or television while you are exercising so that the exercises are a pleasant break in your day. This will make your life better with the exercises acting as a break in routine you can look forward to.  Lying on your back, slowly slide your foot toward your buttocks, raising your knee up off the floor. Then slowly slide your foot back down until your leg is straight again.  Lying on your back spread your legs as far apart as you can without causing discomfort.  Lying on your side, raise your upper leg and foot straight up from the floor as far as is comfortable. Slowly  lower the leg and repeat.  Lying on your back, tighten up the muscle in the front of your thigh (quadriceps muscles). You can do this by keeping your leg straight and trying to raise your heel off the floor. This helps strengthen the largest muscle supporting your knee.  Lying on your back, tighten up the muscles of your buttocks both with the legs straight and with the knee bent at a comfortable angle while keeping your heel on the floor.   POST-OPERATIVE OPIOID TAPER INSTRUCTIONS: It is important to wean off of your opioid medication as soon as possible. If you do not need pain medication after your surgery it is ok to stop day one. Opioids include: Codeine, Hydrocodone(Norco, Vicodin), Oxycodone(Percocet, oxycontin) and hydromorphone amongst others.  Long term and even short term use of opiods can cause: Increased pain response Dependence Constipation Depression Respiratory depression And more.  Withdrawal symptoms can include Flu like symptoms Nausea, vomiting And more Techniques to manage these symptoms Hydrate well Eat regular healthy meals Stay active Use relaxation techniques(deep breathing, meditating, yoga) Do Not substitute Alcohol to help with tapering If you have been on opioids for less than two weeks and do not have pain than it is ok to stop all together.  Plan to wean off of opioids This plan should start within one week post op of your joint replacement. Maintain the same interval or time between taking each dose and first decrease the dose.  Cut the total daily intake of opioids by one tablet each day Next start to increase the time between doses. The last dose that should be eliminated is the evening dose.   IF YOU ARE TRANSFERRED TO A SKILLED REHAB FACILITY If the patient is transferred to a skilled rehab facility following release from the hospital, a list of the current medications will be sent to the facility for the patient to continue.  When discharged from  the skilled rehab facility, please have the facility set up the patient's Home Health Physical Therapy prior to being released. Also, the skilled facility will be responsible for providing the patient with their medications at time of release from the facility to include their pain medication, the muscle relaxants, and their blood thinner medication. If the patient is still at the rehab facility at time of the two week follow up appointment, the skilled rehab facility will also need to assist the patient  in arranging follow up appointment in our office and any transportation needs.  MAKE SURE YOU:  Understand these instructions.  Get help right away if you are not doing well or get worse.    DENTAL ANTIBIOTICS:  In most cases prophylactic antibiotics for Dental procdeures after total joint surgery are not necessary.  Exceptions are as follows:  1. History of prior total joint infection  2. Severely immunocompromised (Organ Transplant, cancer chemotherapy, Rheumatoid biologic meds such as Humera)  3. Poorly controlled diabetes (A1C &gt; 8.0, blood glucose over 200)  If you have one of these conditions, contact your surgeon for an antibiotic prescription, prior to your dental procedure.    Pick up stool softner and laxative for home use following surgery while on pain medications. Do not submerge incision under water. Please use good hand washing techniques while changing dressing each day. May shower starting three days after surgery. Please use a clean towel to pat the incision dry following showers. Continue to use ice for pain and swelling after surgery. Do not use any lotions or creams on the incision until instructed by your surgeon.

## 2023-05-29 NOTE — Anesthesia Postprocedure Evaluation (Signed)
Anesthesia Post Note  Patient: Dawn Collins  Procedure(s) Performed: RIGHT TOTAL HIP ARTHROPLASTY ANTERIOR APPROACH (Right: Hip)     Patient location during evaluation: PACU Anesthesia Type: Spinal Level of consciousness: awake and alert Pain management: pain level controlled Vital Signs Assessment: post-procedure vital signs reviewed and stable Respiratory status: spontaneous breathing and respiratory function stable Cardiovascular status: blood pressure returned to baseline and stable Postop Assessment: spinal receding and no apparent nausea or vomiting Anesthetic complications: no   No notable events documented.  Last Vitals:  Vitals:   05/29/23 1115 05/29/23 1130  BP: (!) 149/55 (!) 154/51  Pulse: (!) 50 (!) 50  Resp: 14 15  Temp:    SpO2: 96% 95%    Last Pain:  Vitals:   05/29/23 1130  PainSc: 6                  Beryle Lathe

## 2023-05-29 NOTE — Interval H&P Note (Signed)
History and Physical Interval Note:  05/29/2023 7:04 AM  [name]  has presented today for surgery, with the diagnosis of right hip osteoarthritis.  The various methods of treatment have been discussed with the patient and family. After consideration of risks, benefits and other options for treatment, the patient has consented to  Procedure(s): TOTAL HIP ARTHROPLASTY ANTERIOR APPROACH (Right) as a surgical intervention.  The patient's history has been reviewed, patient examined, no change in status, stable for surgery.  I have reviewed the patient's chart and labs.  Questions were answered to the patient's satisfaction.     Homero Fellers Mona Ayars

## 2023-05-29 NOTE — Transfer of Care (Signed)
Immediate Anesthesia Transfer of Care Note  Patient: Amylee Lodato  Procedure(s) Performed: RIGHT TOTAL HIP ARTHROPLASTY ANTERIOR APPROACH (Right: Hip)  Patient Location: PACU  Anesthesia Type:MAC and Spinal  Level of Consciousness: awake and alert   Airway & Oxygen Therapy: Patient Spontanous Breathing and Patient connected to face mask oxygen  Post-op Assessment: Report given to RN and Post -op Vital signs reviewed and stable  Post vital signs: Reviewed and stable  Last Vitals:  Vitals Value Taken Time  BP 152/58 05/29/23 1004  Temp    Pulse 60 05/29/23 1006  Resp 19 05/29/23 1006  SpO2 100 % 05/29/23 1006  Vitals shown include unfiled device data.  Last Pain:  Vitals:   05/29/23 0712  PainSc: 9          Complications: No notable events documented.

## 2023-05-29 NOTE — Op Note (Signed)
OPERATIVE REPORT- TOTAL HIP ARTHROPLASTY   PREOPERATIVE DIAGNOSIS: Osteoarthritis of the Right hip.   POSTOPERATIVE DIAGNOSIS: Osteoarthritis of the Right  hip.   PROCEDURE: Right total hip arthroplasty, anterior approach.   SURGEON: Ollen Gross, MD   ASSISTANT: Arcola Jansky, PA-C  ANESTHESIA:  Spinal  ESTIMATED BLOOD LOSS:-150 mL    DRAINS: None  COMPLICATIONS: None   CONDITION: PACU - hemodynamically stable.   BRIEF CLINICAL NOTE: Dawn Collins is a 77 y.o. female who has advanced end-  stage arthritis of their Right  hip with progressively worsening pain and  dysfunction.The patient has failed nonoperative management and presents for  total hip arthroplasty.   PROCEDURE IN DETAIL: After successful administration of spinal  anesthetic, the traction boots for the [facility] bed were placed on both  feet and the patient was placed onto the [facility] bed, boots placed into the leg  holders. The Right hip was then isolated from the perineum with plastic  drapes and prepped and draped in the usual sterile fashion. ASIS and  greater trochanter were marked and a oblique incision was made, starting  at about 1 cm lateral and 2 cm distal to the ASIS and coursing towards  the anterior cortex of the femur. The skin was cut with a 10 blade  through subcutaneous tissue to the level of the fascia overlying the  tensor fascia lata muscle. The fascia was then incised in line with the  incision at the junction of the anterior third and posterior 2/3rd. The  muscle was teased off the fascia and then the interval between the TFL  and the rectus was developed. The Hohmann retractor was then placed at  the top of the femoral neck over the capsule. The vessels overlying the  capsule were cauterized and the fat on top of the capsule was removed.  A Hohmann retractor was then placed anterior underneath the rectus  femoris to give exposure to the entire anterior capsule. A T-shaped   capsulotomy was performed. The edges were tagged and the femoral head  was identified.       Osteophytes are removed off the superior acetabulum.  The femoral neck was then cut in situ with an oscillating saw. Traction  was then applied to the left lower extremity utilizing the [facility]  traction. The femoral head was then removed. Retractors were placed  around the acetabulum and then circumferential removal of the labrum was  performed. Osteophytes were also removed. Reaming starts at 45 mm to  medialize and  Increased in 2 mm increments to 49 mm. We reamed in  approximately 40 degrees of abduction, 20 degrees anteversion. She had a protrusio deformity and I used the reamings from the acetabular reamers To graft centrally and put the cup I normal position. A 50 mm  pinnacle acetabular shell was then impacted in anatomic position under  fluoroscopic guidance with excellent purchase. We did not need to place  any additional dome screws. A 32 mm neutral + 4 marathon liner was then  placed into the acetabular shell.       The femoral lift was then placed along the lateral aspect of the femur  just distal to the vastus ridge. The leg was  externally rotated and capsule  was stripped off the inferior aspect of the femoral neck down to the  level of the lesser trochanter, this was done with electrocautery. The femur was lifted after this was performed. The  leg was then placed in  an extended and adducted position essentially delivering the femur. We also removed the capsule superiorly and the piriformis from the piriformis fossa to gain excellent exposure of the  proximal femur. Rongeur was used to remove some cancellous bone to get  into the lateral portion of the proximal femur for placement of the  initial starter reamer. The starter broaches was placed  the starter broach  and was shown to go down the center of the canal. Broaching  with the Actis system was then performed starting at size 0   coursing  Up to size 4. A size 4 had excellent torsional and rotational  and axial stability. The trial standard offset neck was then placed  with a 32 + 1 trial head. The hip was then reduced. We confirmed that  the stem was in the canal both on AP and lateral x-rays. It also has excellent sizing. The hip was reduced with outstanding stability through full extension and full external rotation.. AP pelvis was taken and the leg lengths were measured and found to be equal. Hip was then dislocated again and the femoral head and neck removed. The  femoral broach was removed. Size 4 Actis stem with a standard offset  neck was then impacted into the femur following native anteversion. Has  excellent purchase in the canal. Excellent torsional and rotational and  axial stability. It is confirmed to be in the canal on AP and lateral  fluoroscopic views. The 32 + 1 metal head was placed and the hip  reduced with outstanding stability. Again AP pelvis was taken and it  confirmed that the leg lengths were equal. The wound was then copiously  irrigated with saline solution and the capsule reattached and repaired  with Ethibond suture. 30 ml of .25% Bupivicaine was  injected into the capsule and into the edge of the tensor fascia lata as well as subcutaneous tissue. The fascia overlying the tensor fascia lata was then closed with a running #1 V-Loc. Subcu was closed with interrupted 2-0 Vicryl and subcuticular running 4-0 Monocryl. Incision was cleaned  and dried. Steri-Strips and a bulky sterile dressing applied. The patient was awakened and transported to  recovery in stable condition.        Please note that a surgical assistant was a medical necessity for this procedure to perform it in a safe and expeditious manner. Assistant was necessary to provide appropriate retraction of vital neurovascular structures and to prevent femoral fracture and allow for anatomic placement of the prosthesis.  Ollen Gross,  M.D.

## 2023-05-29 NOTE — Evaluation (Signed)
Physical Therapy Evaluation Patient Details Name: Dawn Collins MRN: [id number] DOB: 13-Feb-1946 Today's Date: 05/29/2023  History of Present Illness  77 yo female presents to therapy s/p R THA, anterior approach on 05/29/2023 due to failure of conservative measures. Pt PMH includes but is not limited to: angina, asthma, HTN, HDL, diverticulitis, GERD, OSA, cervical spine surgery, and B TKA.  Clinical Impression      Dawn Collins is a 77 y.o. female POD 0 s/p R THA, AA. Patient reports IND with mobility at baseline. Patient is now limited by functional impairments (see PT problem list below) and requires min A for R LE for bed mobility and CGA and cues for transfers. Patient was able to ambulate 55 feet with RW and CGA level of assist. Patient instructed in exercise to facilitate ROM and circulation to manage edema. Patient will benefit from continued skilled PT interventions to address impairments and progress towards PLOF. Acute PT will follow to progress mobility and stair training in preparation for safe discharge home with family support and HEP.     If plan is discharge home, recommend the following: A little help with walking and/or transfers;A little help with bathing/dressing/bathroom;Assistance with cooking/housework;Assist for transportation;Help with stairs or ramp for entrance   Can travel by private vehicle        Equipment Recommendations Rolling walker (2 wheels)  Recommendations for Other Services       Functional Status Assessment Patient has had a recent decline in their functional status and demonstrates the ability to make significant improvements in function in a reasonable and predictable amount of time.     Precautions / Restrictions Precautions Precautions: Fall Restrictions Weight Bearing Restrictions: No      Mobility  Bed Mobility Overal bed mobility: Needs Assistance Bed Mobility: Supine to Sit     Supine to sit: HOB elevated, Used rails, Min  assist     General bed mobility comments: min cues and A for R LE to EOB    Transfers Overall transfer level: Needs assistance Equipment used: Rolling walker (2 wheels) Transfers: Sit to/from Stand Sit to Stand: Contact guard assist, From elevated surface           General transfer comment: min cues    Ambulation/Gait Ambulation/Gait assistance: Contact guard assist Gait Distance (Feet): 55 Feet Assistive device: Rolling walker (2 wheels) Gait Pattern/deviations: Step-to pattern, Antalgic, Trunk flexed Gait velocity: decreased     General Gait Details: B UE support at RW to offload R LE in stance phase, cues for extension posture  Stairs            Wheelchair Mobility     Tilt Bed    Modified Rankin (Stroke Patients Only)       Balance Overall balance assessment: Needs assistance Sitting-balance support: Feet supported Sitting balance-Leahy Scale: Good     Standing balance support: Bilateral upper extremity supported, During functional activity, Reliant on assistive device for balance Standing balance-Leahy Scale: Poor                               Pertinent Vitals/Pain Pain Assessment Pain Assessment: 0-10 Pain Score: 7  Pain Location: R hip Pain Descriptors / Indicators: Constant, Discomfort, Grimacing, Operative site guarding Pain Intervention(s): Limited activity within patient's tolerance, Premedicated before session, Monitored during session, Repositioned, Ice applied    Home Living Family/patient expects to be discharged to:: Private residence Living Arrangements: Alone Available Help at Discharge: Family (  multiple family members are coming to assist) Type of Home: House Home Access: Stairs to enter Entrance Stairs-Rails: None Entrance Stairs-Number of Steps: 1   Home Layout: One level Home Equipment: None      Prior Function Prior Level of Function : Independent/Modified Independent;Driving             Mobility  Comments: IND no AD for all ADLs, self care tasks and IADLs,       Extremity/Trunk Assessment        Lower Extremity Assessment Lower Extremity Assessment: RLE deficits/detail RLE Deficits / Details: ankle DF/PF 5/5 RLE Sensation: WNL    Cervical / Trunk Assessment Cervical / Trunk Assessment: Normal  Communication   Communication Communication: No apparent difficulties  Cognition Arousal: Alert Behavior During Therapy: WFL for tasks assessed/performed Overall Cognitive Status: Within Functional Limits for tasks assessed                                          General Comments      Exercises Total Joint Exercises Ankle Circles/Pumps: AROM, Both, 10 reps   Assessment/Plan    PT Assessment Patient needs continued PT services  PT Problem List Decreased strength;Decreased range of motion;Decreased activity tolerance;Decreased balance;Decreased mobility;Pain       PT Treatment Interventions DME instruction;Gait training;Stair training;Functional mobility training;Therapeutic activities;Therapeutic exercise;Balance training;Neuromuscular re-education;Patient/family education;Modalities    PT Goals (Current goals can be found in the Care Plan section)  Acute Rehab PT Goals Patient Stated Goal: do some things without the hip slowing me down PT Goal Formulation: With patient Time For Goal Achievement: 06/12/23 Potential to Achieve Goals: Good    Frequency 7X/week     Co-evaluation               AM-PAC PT "6 Clicks" Mobility  Outcome Measure Help needed turning from your back to your side while in a flat bed without using bedrails?: A Little Help needed moving from lying on your back to sitting on the side of a flat bed without using bedrails?: A Little Help needed moving to and from a bed to a chair (including a wheelchair)?: A Little Help needed standing up from a chair using your arms (e.g., wheelchair or bedside chair)?: A Little Help  needed to walk in hospital room?: A Little Help needed climbing 3-5 steps with a railing? : A Lot 6 Click Score: 17    End of Session Equipment Utilized During Treatment: Gait belt Activity Tolerance: Patient tolerated treatment well Patient left: in chair;with call bell/phone within reach;with chair alarm set;with family/visitor present Nurse Communication: Mobility status PT Visit Diagnosis: Unsteadiness on feet (R26.81);Other abnormalities of gait and mobility (R26.89);Muscle weakness (generalized) (M62.81);Difficulty in walking, not elsewhere classified (R26.2);Pain Pain - Right/Left: Right Pain - part of body: Leg;Hip    Time: 1478-2956 PT Time Calculation (min) (ACUTE ONLY): 29 min   Charges:   PT Evaluation $PT Eval Low Complexity: 1 Low PT Treatments $Gait Training: 8-22 mins PT General Charges $$ ACUTE PT VISIT: 1 Visit         Johnny Bridge, PT Acute Rehab   Jacqualyn Posey 05/29/2023, 5:41 PM

## 2023-05-30 ENCOUNTER — Encounter (HOSPITAL_COMMUNITY): Payer: Self-pay | Admitting: Orthopedic Surgery

## 2023-05-30 DIAGNOSIS — M1611 Unilateral primary osteoarthritis, right hip: Secondary | ICD-10-CM | POA: Diagnosis not present

## 2023-05-30 LAB — BASIC METABOLIC PANEL
Anion gap: 10 (ref 5–15)
BUN: 11 mg/dL (ref 8–23)
CO2: 21 mmol/L — ABNORMAL LOW (ref 22–32)
Calcium: 8.4 mg/dL — ABNORMAL LOW (ref 8.9–10.3)
Chloride: 101 mmol/L (ref 98–111)
Creatinine, Ser: 0.63 mg/dL (ref 0.44–1.00)
GFR, Estimated: >60 mL/min (ref >60)
Glucose, Bld: 119 mg/dL — ABNORMAL HIGH (ref 70–99)
Potassium: 3.9 mmol/L (ref 3.5–5.1)
Sodium: 132 mmol/L — ABNORMAL LOW (ref 135–145)

## 2023-05-30 LAB — CBC
HCT: 30 % — ABNORMAL LOW (ref 36.0–46.0)
Hemoglobin: 10.1 g/dL — ABNORMAL LOW (ref 12.0–15.0)
MCH: 31.1 pg (ref 26.0–34.0)
MCHC: 33.7 g/dL (ref 30.0–36.0)
MCV: 92.3 fL (ref 80.0–100.0)
Platelets: 164 10*3/uL (ref 150–400)
RBC: 3.25 MIL/uL — ABNORMAL LOW (ref 3.87–5.11)
RDW: 12.3 % (ref 11.5–15.5)
WBC: 8.4 10*3/uL (ref 4.0–10.5)
nRBC: 0 % (ref 0.0–0.2)

## 2023-05-30 MED ORDER — IRBESARTAN 75 MG PO TABS
75.0000 mg | ORAL_TABLET | Freq: Once | ORAL | Status: AC
  Administered 2023-05-30: 75 mg via ORAL
  Filled 2023-05-30: qty 1

## 2023-05-30 MED ORDER — ROSUVASTATIN CALCIUM 20 MG PO TABS
40.0000 mg | ORAL_TABLET | Freq: Every day | ORAL | Status: DC
  Administered 2023-05-30: 40 mg via ORAL
  Filled 2023-05-30: qty 2

## 2023-05-30 NOTE — Progress Notes (Signed)
   Subjective: 1 Day Post-Op Procedure(s) (LRB): RIGHT TOTAL HIP ARTHROPLASTY ANTERIOR APPROACH (Right) Patient seen in rounds by Dr. Lequita Halt. Patient is well, and has had no acute complaints or problems. Denies SOB or chest pain. Denies calf pain. Foley cath removed this AM. Patient reports pain as moderate. Worked with physical therapy and ambulated 55'. We will continue physical therapy today.   Objective: Vital signs in last 24 hours: Temp:  [97.6 F (36.4 C)-98.1 F (36.7 C)] 98 F (36.7 C) (11/07 0607) Pulse Rate:  [49-64] 56 (11/07 0607) Resp:  [12-18] 17 (11/07 0607) BP: (128-178)/(36-62) 154/56 (11/07 0607) SpO2:  [95 %-100 %] 99 % (11/07 0607)  Intake/Output from previous day:  Intake/Output Summary (Last 24 hours) at 05/30/2023 0728 Last data filed at 05/30/2023 0600 Gross per 24 hour  Intake 2540.89 ml  Output 1375 ml  Net 1165.89 ml     Intake/Output this shift: No intake/output data recorded.  Labs: Recent Labs    05/30/23 0358  HGB 10.1*   Recent Labs    05/30/23 0358  WBC 8.4  RBC 3.25*  HCT 30.0*  PLT 164   Recent Labs    05/30/23 0358  NA 132*  K 3.9  CL 101  CO2 21*  BUN 11  CREATININE 0.63  GLUCOSE 119*  CALCIUM 8.4*   No results for input(s): "LABPT", "INR" in the last 72 hours.  Exam: General - Patient is Alert and Oriented Extremity - Neurologically intact Neurovascular intact Sensation intact distally Dorsiflexion/Plantar flexion intact Dressing - dressing C/D/I Motor Function - intact, moving foot and toes well on exam.  Past Medical History:  Diagnosis Date   Arthritis    knees, back.   Asthma    2 weeks cold,no problems now-well controlled   Bursitis, knee    Bil. Hips, not knee   Cancer (HCC)    Complication of anesthesia    slow to wake up   Diverticulitis of colon 05/28/2011   no current problems   GERD (gastroesophageal reflux disease)    tx. pantoprazole   History of hiatal hernia    Hypertension     Multiple thyroid nodules    Myocardial infarction (HCC)    Pneumonia    PONV (postoperative nausea and vomiting)    Sleep apnea    No cpap now -3 months last due to insurance reasons    Assessment/Plan: 1 Day Post-Op Procedure(s) (LRB): RIGHT TOTAL HIP ARTHROPLASTY ANTERIOR APPROACH (Right) Principal Problem:   Osteoarthritis of right hip Active Problems:   OA (osteoarthritis) of hip  Estimated body mass index is 25.46 kg/m as calculated from the following:   Height as of this encounter: 5\' 5"  (1.651 m).   Weight as of this encounter: 69.4 kg. Advance diet Up with therapy D/C IV fluids  DVT Prophylaxis -  Plavix + Aspirin Weight bearing as tolerated.  Continue physical therapy. Will require additional night in hospital to maximize mobility due to limited help at home until tomorrow.  R. Arcola Jansky, PA-C Orthopedic Surgery 05/30/2023, 7:28 AM

## 2023-05-30 NOTE — TOC Transition Note (Signed)
Transition of Care [facility]) - CM/SW Discharge Note   Patient Details  Name: Dawn Collins MRN: [id number] Date of Birth: 1946/01/05  Transition [facility] CM/SW Contact:  Amada Jupiter, LCSW Phone Number: 05/30/2023, 1:38 PM   Clinical Narrative:     Met with pt and confirming she has received a RW to room via [employer] (ordered by ortho MD office prior to surgery).  Plan for HEP.  No further TOC needs.  Final next level of care: Home/Self Care Barriers to Discharge: No Barriers Identified   Patient Goals and CMS Choice      Discharge Placement                         Discharge Plan and Services Additional resources added to the After Visit Summary for                  DME Arranged: Walker rolling DME Agency: [employer]                  Social Determinants of Health (SDOH) Interventions SDOH Screenings   Food Insecurity: No Food Insecurity (05/29/2023)  Housing: Low Risk  (05/29/2023)  Transportation Needs: No Transportation Needs (05/29/2023)  Utilities: Not At Risk (05/29/2023)  Tobacco Use: Low Risk  (05/29/2023)     Readmission Risk Interventions     No data to display

## 2023-05-30 NOTE — Progress Notes (Signed)
Physical Therapy Treatment Patient Details Name: Dawn Collins MRN: [id number] DOB: 17-Nov-1945 Today's Date: 05/30/2023   History of Present Illness 77 yo female presents to therapy s/p R THA, anterior approach on 05/29/2023 due to failure of conservative measures. Pt PMH includes but is not limited to: angina, asthma, HTN, HDL, diverticulitis, GERD, OSA, cervical spine surgery, and B TKA.    PT Comments  The patient reports that she feels that she is improved. Patient mobilizing with improved self control of the right leg, still difficulty placing right leg onto bed.   Plan practice 1 step tomorrow.   If plan is discharge home, recommend the following: A little help with walking and/or transfers;A little help with bathing/dressing/bathroom;Assistance with cooking/housework;Assist for transportation;Help with stairs or ramp for entrance   Can travel by private vehicle        Equipment Recommendations  Rolling walker (2 wheels)    Recommendations for Other Services       Precautions / Restrictions Precautions Precautions: Fall Restrictions Weight Bearing Restrictions: No     Mobility  Bed Mobility Overal bed mobility: Needs Assistance Bed Mobility: Supine to Sit, Sit to Supine     Supine to sit: HOB elevated, Used rails, Supervision Sit to supine: Mod assist   General bed mobility comments: cues to try not using rails, does have adjusable  bed at home, min assist for right leg back onto bed    Transfers Overall transfer level: Needs assistance Equipment used: Rolling walker (2 wheels) Transfers: Sit to/from Stand Sit to Stand: Supervision           General transfer comment: min cues    Ambulation/Gait Ambulation/Gait assistance: Contact guard assist Gait Distance (Feet): 80 Feet Assistive device: Rolling walker (2 wheels) Gait Pattern/deviations: Step-to pattern, Step-through pattern Gait velocity: decreased     General Gait Details: tolerated  very  well   Stairs             Wheelchair Mobility     Tilt Bed    Modified Rankin (Stroke Patients Only)       Balance Overall balance assessment: Needs assistance Sitting-balance support: Feet supported Sitting balance-Leahy Scale: Good     Standing balance support: Bilateral upper extremity supported, During functional activity, Reliant on assistive device for balance Standing balance-Leahy Scale: Poor                              Cognition Arousal: Alert Behavior During Therapy: WFL for tasks assessed/performed, Flat affect Overall Cognitive Status: Within Functional Limits for tasks assessed                                          Exercises Total Joint Exercises Ankle Circles/Pumps: AROM, Both, 10 reps Quad Sets: AROM, Right, 5 reps, Supine Short Arc Quad: AROM, Right, 10 reps Heel Slides: AAROM, Right, Supine, 10 reps Hip ABduction/ADduction: AAROM, 10 reps, Right    General Comments        Pertinent Vitals/Pain Pain Assessment Pain Score: 3  Pain Location: R hip Pain Descriptors / Indicators: Discomfort Pain Intervention(s): Monitored during session, Premedicated before session    Home Living                          Prior Function  PT Goals (current goals can now be found in the care plan section) Progress towards PT goals: Progressing toward goals    Frequency    7X/week      PT Plan      Co-evaluation              AM-PAC PT "6 Clicks" Mobility   Outcome Measure  Help needed turning from your back to your side while in a flat bed without using bedrails?: A Little Help needed moving from lying on your back to sitting on the side of a flat bed without using bedrails?: A Little Help needed moving to and from a bed to a chair (including a wheelchair)?: A Little Help needed standing up from a chair using your arms (e.g., wheelchair or bedside chair)?: A Little Help needed to  walk in hospital room?: A Little Help needed climbing 3-5 steps with a railing? : A Little 6 Click Score: 18    End of Session Equipment Utilized During Treatment: Gait belt Activity Tolerance: Patient tolerated treatment well Patient left: in bed;with call bell/phone within reach;with bed alarm set Nurse Communication: Mobility status PT Visit Diagnosis: Unsteadiness on feet (R26.81);Other abnormalities of gait and mobility (R26.89);Muscle weakness (generalized) (M62.81);Difficulty in walking, not elsewhere classified (R26.2);Pain Pain - Right/Left: Right Pain - part of body: Leg;Hip     Time: 1610-9604 PT Time Calculation (min) (ACUTE ONLY): 26 min  Charges:    $Gait Training: 8-22 mins $Therapeutic Exercise: 8-22 mins PT General Charges $$ ACUTE PT VISIT: 1 Visit          Blanchard Kelch PT Acute Rehabilitation Services Office [phone] Weekend [phone]    Rada Hay 05/30/2023, 4:52 PM

## 2023-05-30 NOTE — Care Management Obs Status (Signed)
MEDICARE OBSERVATION STATUS NOTIFICATION   Patient Details  Name: Dawn Collins MRN: [id number] Date of Birth: 10-Aug-1945   Medicare Observation Status Notification Given:  Hart Robinsons, LCSW 05/30/2023, 1:37 PM

## 2023-05-30 NOTE — Plan of Care (Signed)
  Problem: Activity: Goal: Risk for activity intolerance will decrease Outcome: Progressing   Problem: Nutrition: Goal: Adequate nutrition will be maintained Outcome: Progressing   Problem: Elimination: Goal: Will not experience complications related to urinary retention Outcome: Progressing   Problem: Pain Management: Goal: General experience of comfort will improve Outcome: Progressing   Problem: Safety: Goal: Ability to remain free from injury will improve Outcome: Progressing   Problem: Skin Integrity: Goal: Risk for impaired skin integrity will decrease Outcome: Progressing   Problem: Activity: Goal: Ability to tolerate increased activity will improve Outcome: Progressing   Problem: Pain Management: Goal: Pain level will decrease with appropriate interventions Outcome: Progressing

## 2023-05-30 NOTE — Progress Notes (Signed)
Physical Therapy Treatment Patient Details Name: Dawn Collins MRN: [id number] DOB: [birth year] Today's Date: 05/30/2023   History of Present Illness 77 yo female presents to therapy s/p R THA, anterior approach on 05/29/2023 due to failure of conservative measures. Pt PMH includes but is not limited to: angina, asthma, HTN, HDL, diverticulitis, GERD, OSA, cervical spine surgery, and B TKA.    PT Comments  Patient reporting pain in the right hip/leg, limited ambulation with RW this visit. Patient reports fatigued. Limited ambulation, had recently ambulated to the bathroom.    If plan is discharge home, recommend the following: A little help with walking and/or transfers;A little help with bathing/dressing/bathroom;Assistance with cooking/housework;Assist for transportation;Help with stairs or ramp for entrance   Can travel by private vehicle        Equipment Recommendations  Rolling walker (2 wheels)    Recommendations for Other Services       Precautions / Restrictions Precautions Precautions: Fall Restrictions Weight Bearing Restrictions: No     Mobility  Bed Mobility   Bed Mobility: Supine to Sit, Sit to Supine     Supine to sit: HOB elevated, Used rails, Min assist Sit to supine: Mod assist   General bed mobility comments: assist with right leg  back onto bed    Transfers Overall transfer level: Needs assistance Equipment used: Rolling walker (2 wheels) Transfers: Sit to/from Stand Sit to Stand: Contact guard assist           General transfer comment: min cues    Ambulation/Gait Ambulation/Gait assistance: Min assist Gait Distance (Feet): 20 Feet Assistive device: Rolling walker (2 wheels) Gait Pattern/deviations: Step-to pattern, Antalgic, Trunk flexed Gait velocity: decreased     General Gait Details: limited by  reports of  pain   Stairs             Wheelchair Mobility     Tilt Bed    Modified Rankin (Stroke Patients Only)        Balance Overall balance assessment: Needs assistance Sitting-balance support: Feet supported Sitting balance-Leahy Scale: Good     Standing balance support: Bilateral upper extremity supported, During functional activity, Reliant on assistive device for balance Standing balance-Leahy Scale: Poor                              Cognition Arousal: Alert Behavior During Therapy: WFL for tasks assessed/performed, Flat affect Overall Cognitive Status: Within Functional Limits for tasks assessed                                          Exercises Total Joint Exercises Ankle Circles/Pumps: AROM, Both, 10 reps Quad Sets: AROM, Right, 5 reps, Supine Heel Slides: AAROM, 5 reps, Right, Supine    General Comments        Pertinent Vitals/Pain Pain Assessment Pain Score: 8  Pain Location: R hip Pain Descriptors / Indicators: Constant, Discomfort, Grimacing, Operative site guarding Pain Intervention(s): Monitored during session, Premedicated before session, Ice applied, Repositioned    Home Living                          Prior Function            PT Goals (current goals can now be found in the care plan section) Progress towards PT goals: Progressing toward goals  Frequency    7X/week      PT Plan      Co-evaluation              AM-PAC PT "6 Clicks" Mobility   Outcome Measure  Help needed turning from your back to your side while in a flat bed without using bedrails?: A Little Help needed moving from lying on your back to sitting on the side of a flat bed without using bedrails?: A Lot Help needed moving to and from a bed to a chair (including a wheelchair)?: A Little Help needed standing up from a chair using your arms (e.g., wheelchair or bedside chair)?: A Little Help needed to walk in hospital room?: A Little Help needed climbing 3-5 steps with a railing? : A Lot 6 Click Score: 16    End of Session Equipment  Utilized During Treatment: Gait belt Activity Tolerance: Patient limited by fatigue;Patient limited by pain Patient left: in bed;with call bell/phone within reach;with bed alarm set Nurse Communication: Mobility status PT Visit Diagnosis: Unsteadiness on feet (R26.81);Other abnormalities of gait and mobility (R26.89);Muscle weakness (generalized) (M62.81);Difficulty in walking, not elsewhere classified (R26.2);Pain Pain - Right/Left: Right Pain - part of body: Leg;Hip     Time: 1007-1029 PT Time Calculation (min) (ACUTE ONLY): 22 min  Charges:    $Gait Training: 8-22 mins PT General Charges $$ ACUTE PT VISIT: 1 Visit                     Blanchard Kelch PT Acute Rehabilitation Services Office [phone] Weekend [phone]    Rada Hay 05/30/2023, 4:02 PM

## 2023-05-31 DIAGNOSIS — M1611 Unilateral primary osteoarthritis, right hip: Secondary | ICD-10-CM | POA: Diagnosis not present

## 2023-05-31 LAB — CBC
HCT: 30.1 % — ABNORMAL LOW (ref 36.0–46.0)
Hemoglobin: 10.1 g/dL — ABNORMAL LOW (ref 12.0–15.0)
MCH: 30.4 pg (ref 26.0–34.0)
MCHC: 33.6 g/dL (ref 30.0–36.0)
MCV: 90.7 fL (ref 80.0–100.0)
Platelets: 165 10*3/uL (ref 150–400)
RBC: 3.32 MIL/uL — ABNORMAL LOW (ref 3.87–5.11)
RDW: 12.6 % (ref 11.5–15.5)
WBC: 9.5 10*3/uL (ref 4.0–10.5)
nRBC: 0 % (ref 0.0–0.2)

## 2023-05-31 MED ORDER — TRAMADOL HCL 50 MG PO TABS: 50.0000 mg | ORAL_TABLET | Freq: Four times a day (QID) | ORAL | 0 refills | Status: AC | PRN

## 2023-05-31 MED ORDER — METHOCARBAMOL 500 MG PO TABS: 500.0000 mg | ORAL_TABLET | Freq: Four times a day (QID) | ORAL | 0 refills | Status: AC | PRN

## 2023-05-31 MED ORDER — ONDANSETRON HCL 4 MG PO TABS: 4.0000 mg | ORAL_TABLET | Freq: Four times a day (QID) | ORAL | 0 refills | Status: AC | PRN

## 2023-05-31 MED ORDER — ASPIRIN 325 MG PO TBEC: 325.0000 mg | DELAYED_RELEASE_TABLET | Freq: Every day | ORAL | 0 refills | Status: AC

## 2023-05-31 NOTE — Plan of Care (Signed)
  Problem: Activity: Goal: Risk for activity intolerance will decrease Outcome: Progressing   Problem: Pain Management: Goal: General experience of comfort will improve Outcome: Progressing   Problem: Safety: Goal: Ability to remain free from injury will improve Outcome: Progressing

## 2023-05-31 NOTE — Progress Notes (Signed)
Physical Therapy Treatment Patient Details Name: Dawn Collins MRN: [id number] DOB: 1946-02-22 Today's Date: 05/31/2023   History of Present Illness 77 yo female presents to therapy s/p R THA, anterior approach on 05/29/2023 due to failure of conservative measures. Pt PMH includes but is not limited to: angina, asthma, HTN, HDL, diverticulitis, GERD, OSA, cervical spine surgery, and B TKA.    PT Comments  POD #2 PM Session Pt. was seen in recliner at start of treatment. She stated that the RN said it was too early to take more pain medication. She required cueing and assistance to position R LE in preparation to sit from standing. Patient ambulated 40 feet, requiring less cues for walker safety and hand positioning. Stair training was completed using one step with no hand rails. She required cueing to go "down with the bad" when going down the step, but required no cueing when going up the step. Patient performed HEP exercises and stated that she will do them, because she did them for both her knee replacements. Patient has met mobility goals and is ready for D/C.   If plan is discharge home, recommend the following: A little help with walking and/or transfers;A little help with bathing/dressing/bathroom;Assistance with cooking/housework;Assist for transportation;Help with stairs or ramp for entrance   Can travel by private vehicle        Equipment Recommendations  Rolling walker (2 wheels)    Recommendations for Other Services       Precautions / Restrictions Precautions Precautions: None Restrictions Weight Bearing Restrictions: No     Mobility  Bed Mobility N/A pt. was in chair at beginning of session  Transfers Overall transfer level: Needs assistance Equipment used: Rolling walker (2 wheels) Transfers: Sit to/from Stand Sit to Stand: Contact guard assist           General transfer comment: Cues and assistance required for R LE positioning when sitting and standing.  Cues to push off bed with UEs.    Ambulation/Gait Ambulation/Gait assistance: Contact guard assist Gait Distance (Feet): 40 Feet Assistive device: Rolling walker (2 wheels) Gait Pattern/deviations: Step-through pattern, Trunk flexed, Narrow base of support, Antalgic Gait velocity: decreased     General Gait Details: Less pain this afternoon, ambulated 40 ft requiring less cueing about walker saftey   Stairs Stairs: Yes Stairs assistance: Min assist, Contact guard assist Stair Management: No rails Number of Stairs: 1 General stair comments: Pt. did not require cueing to go up step, but did require cueing for correct foot advancement going down.   Wheelchair Mobility     Tilt Bed    Modified Rankin (Stroke Patients Only)       Balance                                            Cognition Arousal: Alert Behavior During Therapy: WFL for tasks assessed/performed, Flat affect Overall Cognitive Status: Within Functional Limits for tasks assessed                                 General Comments: A & O x 4        Exercises Total Joint Exercises Ankle Circles/Pumps: AROM, Both, 10 reps, Supine Quad Sets: AROM, Right, 5 reps, Supine Short Arc Quad: AROM, Right, 5 reps, Supine Heel Slides: AAROM, Right, Supine, 5 reps  Hip ABduction/ADduction: AAROM, 10 reps, Right (Needed assist to complete) Long Arc Quad: AROM, Right, 5 reps, Seated Standing Hip Extension:  (Instructed)    General Comments        Pertinent Vitals/Pain Pain Assessment Pain Score: 6  (During gait) Pain Location: R hip Pain Descriptors / Indicators: Burning Pain Intervention(s): Monitored during session, Repositioned, Ice applied    Home Living                          Prior Function            PT Goals (current goals can now be found in the care plan section) Acute Rehab PT Goals Patient Stated Goal: do some things without the hip slowing me  down PT Goal Formulation: With patient Time For Goal Achievement: 06/12/23 Progress towards PT goals: Progressing toward goals    Frequency    7X/week      PT Plan      Co-evaluation              AM-PAC PT "6 Clicks" Mobility   Outcome Measure  Help needed turning from your back to your side while in a flat bed without using bedrails?: A Little Help needed moving from lying on your back to sitting on the side of a flat bed without using bedrails?: A Little Help needed moving to and from a bed to a chair (including a wheelchair)?: A Little Help needed standing up from a chair using your arms (e.g., wheelchair or bedside chair)?: A Little Help needed to walk in hospital room?: A Little Help needed climbing 3-5 steps with a railing? : A Little 6 Click Score: 18    End of Session Equipment Utilized During Treatment: Gait belt Activity Tolerance: Patient tolerated treatment well Patient left: with call bell/phone within reach;in chair;with chair alarm set Nurse Communication: Mobility status PT Visit Diagnosis: Unsteadiness on feet (R26.81);Other abnormalities of gait and mobility (R26.89);Muscle weakness (generalized) (M62.81);Difficulty in walking, not elsewhere classified (R26.2);Pain Pain - Right/Left: Right Pain - part of body: Leg;Hip     Time: 0454-0981 PT Time Calculation (min) (ACUTE ONLY): 24 min  Charges:    $Gait Training: 8-22 mins $Therapeutic Exercise: 8-22 mins PT General Charges $$ ACUTE PT VISIT: 1 Visit                      Lazaro Arms, SPTA 05/31/2023, 3:31 PM

## 2023-05-31 NOTE — Progress Notes (Signed)
Physical Therapy Treatment Patient Details Name: Dawn Collins MRN: [id number] DOB: 22-May-1946 Today's Date: 05/31/2023   History of Present Illness 77 yo female presents to therapy s/p R THA, anterior approach on 05/29/2023 due to failure of conservative measures. Pt PMH includes but is not limited to: angina, asthma, HTN, HDL, diverticulitis, GERD, OSA, cervical spine surgery, and B TKA.    PT Comments  POD #2 AM Session Treatment session was limited due to patient's R LE pain increasing to 10/10 with facial grimacing and patient tearing up during gait. Patient was returned to bed with total assist of B LEs. RN was notified that patient requested pain medication. Patient took pain medication earlier in the morning, which did not seem to help. Will return later to attempt to finish AM session.   If plan is discharge home, recommend the following: A little help with walking and/or transfers;A little help with bathing/dressing/bathroom;Assistance with cooking/housework;Assist for transportation;Help with stairs or ramp for entrance   Can travel by private vehicle        Equipment Recommendations  Rolling walker (2 wheels)    Recommendations for Other Services       Precautions / Restrictions Precautions Precautions: None Restrictions Weight Bearing Restrictions: No     Mobility  Bed Mobility Overal bed mobility: Needs Assistance Bed Mobility: Supine to Sit, Sit to Supine     Supine to sit: HOB elevated, Used rails, Supervision Sit to supine: Mod assist   General bed mobility comments: Cues to use gait belt to assist R LE to get to EOB. Total assist LEs getting back to bed    Transfers Overall transfer level: Needs assistance Equipment used: Rolling walker (2 wheels) Transfers: Sit to/from Stand Sit to Stand: Contact guard assist           General transfer comment: Cues for R LE positioning when sitting and standing and to push off bed with UEs     Ambulation/Gait Ambulation/Gait assistance: Contact guard assist Gait Distance (Feet): 10 Feet Assistive device: Rolling walker (2 wheels) Gait Pattern/deviations: Step-through pattern, Trunk flexed, Narrow base of support, Antalgic (!!!) Gait velocity: decreased     General Gait Details: Gait distance limited due to atients pain increasing to 10/10.   Stairs             Wheelchair Mobility     Tilt Bed    Modified Rankin (Stroke Patients Only)       Balance                                            Cognition Arousal: Alert Behavior During Therapy: WFL for tasks assessed/performed, Flat affect Overall Cognitive Status: Within Functional Limits for tasks assessed                                 General Comments: A & O x 4        Exercises      General Comments        Pertinent Vitals/Pain Pain Assessment Pain Score: 10-Worst pain ever Pain Location: R hip Pain Descriptors / Indicators: Burning Pain Intervention(s): Repositioned, Ice applied, Limited activity within patient's tolerance, Patient requesting pain meds-RN notified, Monitored during session    Home Living  Prior Function            PT Goals (current goals can now be found in the care plan section) Acute Rehab PT Goals Patient Stated Goal: do some things without the hip slowing me down PT Goal Formulation: With patient Time For Goal Achievement: 06/12/23 Progress towards PT goals: Progressing toward goals    Frequency    7X/week      PT Plan      Co-evaluation              AM-PAC PT "6 Clicks" Mobility   Outcome Measure  Help needed turning from your back to your side while in a flat bed without using bedrails?: A Little Help needed moving from lying on your back to sitting on the side of a flat bed without using bedrails?: A Little Help needed moving to and from a bed to a chair (including a  wheelchair)?: A Little Help needed standing up from a chair using your arms (e.g., wheelchair or bedside chair)?: A Little Help needed to walk in hospital room?: A Little Help needed climbing 3-5 steps with a railing? : A Lot 6 Click Score: 17    End of Session Equipment Utilized During Treatment: Gait belt Activity Tolerance: Patient limited by pain Patient left: in bed;with call bell/phone within reach;with bed alarm set Nurse Communication: Mobility status;Patient requests pain meds PT Visit Diagnosis: Unsteadiness on feet (R26.81);Other abnormalities of gait and mobility (R26.89);Muscle weakness (generalized) (M62.81);Difficulty in walking, not elsewhere classified (R26.2);Pain Pain - Right/Left: Right Pain - part of body: Leg;Hip     Time: 0865-7846 PT Time Calculation (min) (ACUTE ONLY): 17 min  Charges:    $Gait Training: 8-22 mins PT General Charges $$ ACUTE PT VISIT: 1 Visit                     Lazaro Arms, SPTA 05/31/2023, 1:03 PM

## 2023-05-31 NOTE — Plan of Care (Signed)
  Problem: Activity: Goal: Risk for activity intolerance will decrease Outcome: Progressing   Problem: Elimination: Goal: Will not experience complications related to urinary retention Outcome: Progressing   Problem: Pain Management: Goal: General experience of comfort will improve Outcome: Progressing   Problem: Safety: Goal: Ability to remain free from injury will improve Outcome: Progressing

## 2023-05-31 NOTE — Progress Notes (Signed)
Physical Therapy Treatment Patient Details Name: Dawn Collins MRN: [id number] DOB: Nov 08, 1945 Today's Date: 05/31/2023   History of Present Illness 77 yo female presents to therapy s/p R THA, anterior approach on 05/29/2023 due to failure of conservative measures. Pt PMH includes but is not limited to: angina, asthma, HTN, HDL, diverticulitis, GERD, OSA, cervical spine surgery, and B TKA.    PT Comments  POD #2 AM Session cont. Patient took pain medication 1 hour before session. Patient had less pain and was able to complete session. She ambulated 40 ft with a RW and required cues for safe walker advancement and how to correctly grip the walker. After ambulation, patient performed HEP exercises to tolerance, and demonstrated an understanding of them. Will come back this afternoon for a PM session.   If plan is discharge home, recommend the following: A little help with walking and/or transfers;A little help with bathing/dressing/bathroom;Assistance with cooking/housework;Assist for transportation;Help with stairs or ramp for entrance   Can travel by private vehicle        Equipment Recommendations  Rolling walker (2 wheels)    Recommendations for Other Services       Precautions / Restrictions Precautions Precautions: None Restrictions Weight Bearing Restrictions: No     Mobility  Bed Mobility Overal bed mobility: Needs Assistance Bed Mobility: Supine to Sit     Supine to sit: HOB elevated, Used rails, Contact guard Sit to supine: Mod assist   General bed mobility comments: Cues to use gait belt to assist R LE to get to EOB. Cues to come up onto elbows, then hands    Transfers Overall transfer level: Needs assistance Equipment used: Rolling walker (2 wheels) Transfers: Sit to/from Stand Sit to Stand: Contact guard assist           General transfer comment: Cues for R LE positioning when sitting and standing and to push off bed with UEs.     Ambulation/Gait Ambulation/Gait assistance: Contact guard assist Gait Distance (Feet): 40 Feet Assistive device: Rolling walker (2 wheels) Gait Pattern/deviations: Step-through pattern, Trunk flexed, Narrow base of support, Antalgic Gait velocity: decreased     General Gait Details: Less pain with ambulation, farther distance than first AM session   Stairs             Wheelchair Mobility     Tilt Bed    Modified Rankin (Stroke Patients Only)       Balance                                            Cognition Arousal: Alert Behavior During Therapy: WFL for tasks assessed/performed, Flat affect Overall Cognitive Status: Within Functional Limits for tasks assessed                                 General Comments: A & O x 4        Exercises Total Joint Exercises Ankle Circles/Pumps: AROM, Both, 10 reps, Supine Quad Sets: AROM, Right, 5 reps, Supine Short Arc Quad: AROM, Right, 5 reps, Supine Heel Slides: AAROM, Right, Supine, 5 reps Hip ABduction/ADduction: AAROM, 10 reps, Right (Needed assist to complete) Long Arc Quad: AROM, Right, 5 reps, Seated Standing Hip Extension:  (Instructed)    General Comments        Pertinent Vitals/Pain Pain Assessment Pain  Score: 7  (During gait) Pain Location: R hip Pain Descriptors / Indicators: Burning Pain Intervention(s): Monitored during session, Repositioned, Ice applied    Home Living                          Prior Function            PT Goals (current goals can now be found in the care plan section) Acute Rehab PT Goals Patient Stated Goal: do some things without the hip slowing me down PT Goal Formulation: With patient Time For Goal Achievement: 06/12/23 Progress towards PT goals: Progressing toward goals    Frequency    7X/week      PT Plan      Co-evaluation              AM-PAC PT "6 Clicks" Mobility   Outcome Measure  Help needed  turning from your back to your side while in a flat bed without using bedrails?: A Little Help needed moving from lying on your back to sitting on the side of a flat bed without using bedrails?: A Little Help needed moving to and from a bed to a chair (including a wheelchair)?: A Little Help needed standing up from a chair using your arms (e.g., wheelchair or bedside chair)?: A Little Help needed to walk in hospital room?: A Little Help needed climbing 3-5 steps with a railing? : A Little 6 Click Score: 18    End of Session Equipment Utilized During Treatment: Gait belt Activity Tolerance: Patient tolerated treatment well Patient left: with call bell/phone within reach;in chair;with chair alarm set Nurse Communication: Mobility status PT Visit Diagnosis: Unsteadiness on feet (R26.81);Other abnormalities of gait and mobility (R26.89);Muscle weakness (generalized) (M62.81);Difficulty in walking, not elsewhere classified (R26.2);Pain Pain - Right/Left: Right Pain - part of body: Leg;Hip     Time: 1030-1055 PT Time Calculation (min) (ACUTE ONLY): 25 min  Charges:    $Gait Training: 8-22 mins $Therapeutic Exercise: 8-22 mins PT General Charges $$ ACUTE PT VISIT: 1 Visit                     Lazaro Arms, SPTA 05/31/2023, 1:39 PM

## 2023-05-31 NOTE — Progress Notes (Signed)
   Subjective: 2 Days Post-Op Procedure(s) (LRB): RIGHT TOTAL HIP ARTHROPLASTY ANTERIOR APPROACH (Right) Patient reports pain as mild.   Patient seen in rounds by Dr. Lequita Halt. Patient is well, and has had no acute complaints or problems Plan is to go Home after hospital stay.  Objective: Vital signs in last 24 hours: Temp:  [97.7 F (36.5 C)-98.6 F (37 C)] 98.3 F (36.8 C) (11/08 0622) Pulse Rate:  [55-60] 59 (11/08 0622) Resp:  [15-18] 18 (11/08 0622) BP: (127-179)/(53-67) 175/53 (11/08 0622) SpO2:  [98 %-100 %] 98 % (11/08 0622)  Intake/Output from previous day:  Intake/Output Summary (Last 24 hours) at 05/31/2023 0757 Last data filed at 05/31/2023 6440 Gross per 24 hour  Intake 460 ml  Output --  Net 460 ml    Intake/Output this shift: No intake/output data recorded.  Labs: Recent Labs    05/30/23 0358 05/31/23 0353  HGB 10.1* 10.1*   Recent Labs    05/30/23 0358 05/31/23 0353  WBC 8.4 9.5  RBC 3.25* 3.32*  HCT 30.0* 30.1*  PLT 164 165   Recent Labs    05/30/23 0358  NA 132*  K 3.9  CL 101  CO2 21*  BUN 11  CREATININE 0.63  GLUCOSE 119*  CALCIUM 8.4*   No results for input(s): "LABPT", "INR" in the last 72 hours.  Exam: General - Patient is Alert and Oriented Extremity - Neurologically intact Neurovascular intact Sensation intact distally Dorsiflexion/Plantar flexion intact Dressing/Incision - clean, dry, no drainage Motor Function - intact, moving foot and toes well on exam.   Past Medical History:  Diagnosis Date   Arthritis    knees, back.   Asthma    2 weeks cold,no problems now-well controlled   Bursitis, knee    Bil. Hips, not knee   Cancer (HCC)    Complication of anesthesia    slow to wake up   Diverticulitis of colon 05/28/2011   no current problems   GERD (gastroesophageal reflux disease)    tx. pantoprazole   History of hiatal hernia    Hypertension    Multiple thyroid nodules    Myocardial infarction (HCC)     Pneumonia    PONV (postoperative nausea and vomiting)    Sleep apnea    No cpap now -3 months last due to insurance reasons    Assessment/Plan: 2 Days Post-Op Procedure(s) (LRB): RIGHT TOTAL HIP ARTHROPLASTY ANTERIOR APPROACH (Right) Principal Problem:   Osteoarthritis of right hip Active Problems:   OA (osteoarthritis) of hip  Estimated body mass index is 25.46 kg/m as calculated from the following:   Height as of this encounter: 5\' 5"  (1.651 m).   Weight as of this encounter: 69.4 kg. Advance diet Up with therapy  DVT Prophylaxis - Aspirin and Plavix Weight-bearing as tolerated  Plan for discharge to home today with HEP. Will have adequate help at home today. Follow-up in our office in 2 weeks  The PDMP database was reviewed today prior to any opioid medications being prescribed to this patient.   Arther Abbott, PA-C Orthopedic Surgery [phone] 05/31/2023, 7:57 AM

## 2023-06-07 NOTE — Discharge Summary (Signed)
Patient ID: Dawn Collins MRN: [id number] DOB/AGE: 1945-12-12 77 y.o.  Admit date: 05/29/2023 Discharge date: 05/31/2023  Admission Diagnoses:  Principal Problem:   Osteoarthritis of right hip Active Problems:   OA (osteoarthritis) of hip   Discharge Diagnoses:  Same  Past Medical History:  Diagnosis Date   Arthritis    knees, back.   Asthma    2 weeks cold,no problems now-well controlled   Bursitis, knee    Bil. Hips, not knee   Cancer (HCC)    Complication of anesthesia    slow to wake up   Diverticulitis of colon 05/28/2011   no current problems   GERD (gastroesophageal reflux disease)    tx. pantoprazole   History of hiatal hernia    Hypertension    Multiple thyroid nodules    Myocardial infarction (HCC)    Pneumonia    PONV (postoperative nausea and vomiting)    Sleep apnea    No cpap now -3 months last due to insurance reasons    Surgeries: Procedure(s): RIGHT TOTAL HIP ARTHROPLASTY ANTERIOR APPROACH on 05/29/2023   Consultants:   Discharged Condition: Improved  Hospital Course: Dawn Collins is an 77 y.o. female who was admitted 05/29/2023 for operative treatment ofOsteoarthritis of right hip. Patient has severe unremitting pain that affects sleep, daily activities, and work/hobbies. After pre-op clearance the patient was taken to the operating room on 05/29/2023 and underwent  Procedure(s): RIGHT TOTAL HIP ARTHROPLASTY ANTERIOR APPROACH.    Patient was given perioperative antibiotics:  Anti-infectives (From admission, onward)    Start     Dose/Rate Route Frequency Ordered Stop   05/29/23 1500  ceFAZolin (ANCEF) IVPB 2g/100 mL premix        2 g 200 mL/hr over 30 Minutes Intravenous Every 6 hours 05/29/23 1001 05/29/23 2229   05/29/23 0645  ceFAZolin (ANCEF) IVPB 2g/100 mL premix        2 g 200 mL/hr over 30 Minutes Intravenous On call to O.R. 05/29/23 0102 05/29/23 0900        Patient was given sequential compression devices, early ambulation,  and chemoprophylaxis to prevent DVT.  Patient benefited maximally from hospital stay and there were no complications.    Recent vital signs: No data found.   Recent laboratory studies: No results for input(s): "WBC", "HGB", "HCT", "PLT", "NA", "K", "CL", "CO2", "BUN", "CREATININE", "GLUCOSE", "INR", "CALCIUM" in the last 72 hours.  Invalid input(s): "PT", "2"   Discharge Medications:   Allergies as of 05/31/2023       Reactions   Morphine And Codeine Nausea And Vomiting   Avelox [moxifloxacin Hcl In Nacl] Other (See Comments)   Numbness   Celebrex [celecoxib] Other (See Comments)   Chest pain   Clarithromycin Nausea And Vomiting   Codeine Other (See Comments)   Sweating,vomiting and shortness of breath   Food Nausea And Vomiting   Seafood (all)   Hycodan [hydrocodone Bit-homatrop Mbr] Other (See Comments)   dyspnea   Keflex [cephalexin] Other (See Comments)   SICK AND WEAK, TINGLING DOWN MY FACE   Pentazocine Other (See Comments)   Chest pain   Septra [bactrim] Nausea And Vomiting   Sertraline Hcl Other (See Comments)   tinnitus   Stadol [butorphanol Tartrate] Nausea And Vomiting   Duraphen [phenylephrine-guaifenesin] Palpitations   Floxin [ofloxacin] Rash   Penicillins Rash   Tolerated Cephalosporin Date: 05/31/23.          Medication List     TAKE these medications    acetaminophen 500  MG tablet Commonly known as: TYLENOL Take 1,000 mg by mouth every 6 (six) hours as needed for moderate pain (pain score 4-6). For arthritis   albuterol 108 (90 Base) MCG/ACT inhaler Commonly known as: VENTOLIN HFA Inhale 2 puffs into the lungs every 6 (six) hours as needed for wheezing or shortness of breath.   aspirin EC 325 MG tablet Take 1 tablet (325 mg total) by mouth daily for 19 days. Then resume one 81 mg aspirin once a day What changed:  medication strength how much to take additional instructions   Breo Ellipta 100-25 MCG/INH Aepb Generic drug: fluticasone  furoate-vilanterol Inhale 1 puff by mouth into the lungs daily. What changed:  when to take this reasons to take this   clopidogrel 75 MG tablet Commonly known as: PLAVIX Take 75 mg by mouth daily.   estradiol 2 MG tablet Commonly known as: ESTRACE Take 2 mg by mouth daily.   ezetimibe 10 MG tablet Commonly known as: ZETIA Take 10 mg by mouth every evening.   famotidine 20 MG tablet Commonly known as: PEPCID Take 1 tablet (20 mg total) by mouth 2 (two) times daily.   fluticasone 50 MCG/ACT nasal spray Commonly known as: FLONASE Place 1-2 sprays into both nostrils daily as needed for allergies.   gabapentin 100 MG capsule Commonly known as: NEURONTIN Take 100 mg by mouth 2 (two) times daily as needed (pain).   latanoprost 0.005 % ophthalmic solution Commonly known as: XALATAN Place 1 drop into both eyes at bedtime.   magnesium oxide 400 (240 Mg) MG tablet Commonly known as: MAG-OX Take 1 tablet by mouth daily.   methocarbamol 500 MG tablet Commonly known as: ROBAXIN Take 1 tablet (500 mg total) by mouth every 6 (six) hours as needed for muscle spasms.   metoprolol succinate 50 MG 24 hr tablet Commonly known as: TOPROL-XL Take 50 mg by mouth daily.   montelukast 10 MG tablet Commonly known as: SINGULAIR Take 10 mg by mouth at bedtime.   Mucinex 600 MG 12 hr tablet Generic drug: guaiFENesin Take 600 mg by mouth 2 (two) times daily as needed for cough.   ondansetron 4 MG tablet Commonly known as: ZOFRAN Take 1 tablet (4 mg total) by mouth every 6 (six) hours as needed for nausea.   RABEprazole 20 MG tablet Commonly known as: ACIPHEX Take 20 mg by mouth 2 (two) times daily.   rosuvastatin 40 MG tablet Commonly known as: CRESTOR Take 40 mg by mouth daily.   spironolactone 25 MG tablet Commonly known as: ALDACTONE Take 25 mg by mouth daily.   telmisartan 20 MG tablet Commonly known as: MICARDIS Take 20 mg by mouth daily.   traMADol 50 MG  tablet Commonly known as: ULTRAM Take 1-2 tablets (50-100 mg total) by mouth every 6 (six) hours as needed for moderate pain (pain score 4-6). What changed:  how much to take when to take this   Vitamin D (Ergocalciferol) 1.25 MG (50000 UNIT) Caps capsule Commonly known as: DRISDOL Take 50,000 Units by mouth every Tuesday.               Discharge Care Instructions  (From admission, onward)           Start     Ordered   05/31/23 0000  Weight bearing as tolerated        05/31/23 0800   05/31/23 0000  Change dressing       Comments: You have an adhesive waterproof bandage over  the incision. Leave this in place until your first follow-up appointment. Once you remove this you will not need to place another bandage.   05/31/23 0800            Diagnostic Studies: DG Pelvis Portable  Result Date: 05/29/2023 CLINICAL DATA:  Postop right hip arthroplasty. EXAM: PORTABLE PELVIS 1-2 VIEWS COMPARISON:  None Available. FINDINGS: Right hip arthroplasty in expected alignment. No periprosthetic lucency or fracture. Recent postsurgical change includes air and edema in the soft tissues. IMPRESSION: Right hip arthroplasty without immediate postoperative complication. Electronically Signed   By: Narda Rutherford M.D.   On: 05/29/2023 11:11   DG HIP UNILAT WITH PELVIS 1V RIGHT  Result Date: 05/29/2023 CLINICAL DATA:  Elective surgery. EXAM: DG HIP (WITH OR WITHOUT PELVIS) 1V RIGHT COMPARISON:  None Available. FINDINGS: Two fluoroscopic spot views of the pelvis and right hip obtained in the operating room. Images during hip arthroplasty. Fluoroscopy time 7 seconds. Dose 1 mGy. IMPRESSION: Intraoperative fluoroscopy during right hip arthroplasty. Electronically Signed   By: Narda Rutherford M.D.   On: 05/29/2023 11:11   DG C-Arm 1-60 Min-No Report  Result Date: 05/29/2023 Fluoroscopy was utilized by the requesting physician.  No radiographic interpretation.    Disposition: Discharge  disposition: 01-Home or Self Care       Discharge Instructions     Call MD / Call 911   Complete by: As directed    If you experience chest pain or shortness of breath, CALL 911 and be transported to the hospital emergency room.  If you develope a fever above 101 F, pus (white drainage) or increased drainage or redness at the wound, or calf pain, call your surgeon's office.   Change dressing   Complete by: As directed    You have an adhesive waterproof bandage over the incision. Leave this in place until your first follow-up appointment. Once you remove this you will not need to place another bandage.   Constipation Prevention   Complete by: As directed    Drink plenty of fluids.  Prune juice may be helpful.  You may use a stool softener, such as Colace (over the counter) 100 mg twice a day.  Use MiraLax (over the counter) for constipation as needed.   Diet - low sodium heart healthy   Complete by: As directed    Do not sit on low chairs, stoools or toilet seats, as it may be difficult to get up from low surfaces   Complete by: As directed    Driving restrictions   Complete by: As directed    No driving for two weeks   Post-operative opioid taper instructions:   Complete by: As directed    POST-OPERATIVE OPIOID TAPER INSTRUCTIONS: It is important to wean off of your opioid medication as soon as possible. If you do not need pain medication after your surgery it is ok to stop day one. Opioids include: Codeine, Hydrocodone(Norco, Vicodin), Oxycodone(Percocet, oxycontin) and hydromorphone amongst others.  Long term and even short term use of opiods can cause: Increased pain response Dependence Constipation Depression Respiratory depression And more.  Withdrawal symptoms can include Flu like symptoms Nausea, vomiting And more Techniques to manage these symptoms Hydrate well Eat regular healthy meals Stay active Use relaxation techniques(deep breathing, meditating, yoga) Do  Not substitute Alcohol to help with tapering If you have been on opioids for less than two weeks and do not have pain than it is ok to stop all together.  Plan to wean  off of opioids This plan should start within one week post op of your joint replacement. Maintain the same interval or time between taking each dose and first decrease the dose.  Cut the total daily intake of opioids by one tablet each day Next start to increase the time between doses. The last dose that should be eliminated is the evening dose.      TED hose   Complete by: As directed    Use stockings (TED hose) for three weeks on both leg(s).  You may remove them at night for sleeping.   Weight bearing as tolerated   Complete by: As directed         Follow-up Information     Aluisio, Homero Fellers, MD. Schedule an appointment as soon as possible for a visit in 2 week(s).   Specialty: Orthopedic Surgery Contact information: [address] [place] [address] [phone]                  Signed: Arther Abbott 06/07/2023, 1:16 PM

## 2023-06-25 NOTE — Therapy (Signed)
OUTPATIENT PHYSICAL THERAPY LOWER EXTREMITY EVALUATION   Patient Name: Dawn Collins MRN: [id number] DOB:12-28-1945, 77 y.o., female Today's Date: 06/26/2023  END OF SESSION:  PT End of Session - 06/26/23 1614     Visit Number 1    Date for PT Re-Evaluation 09/18/23    Authorization Type Medicare    PT Start Time 1615    PT Stop Time 1700    PT Time Calculation (min) 45 min    Activity Tolerance Patient limited by pain;Patient tolerated treatment well    Behavior During Therapy [facility] for tasks assessed/performed             Past Medical History:  Diagnosis Date   Arthritis    knees, back.   Asthma    2 weeks cold,no problems now-well controlled   Bursitis, knee    Bil. Hips, not knee   Cancer (HCC)    Complication of anesthesia    slow to wake up   Diverticulitis of colon 05/28/2011   no current problems   GERD (gastroesophageal reflux disease)    tx. pantoprazole   History of hiatal hernia    Hypertension    Multiple thyroid nodules    Myocardial infarction (HCC)    Pneumonia    PONV (postoperative nausea and vomiting)    Sleep apnea    No cpap now -3 months last due to insurance reasons   Past Surgical History:  Procedure Laterality Date   ABDOMINAL HYSTERECTOMY     partial hyst   BREAST BIOPSY     several- all benign   CARDIAC CATHETERIZATION     10'11   CERVICAL DISC SURGERY     1977   FOOT SURGERY     Bilateral   KNEE ARTHROSCOPY     right '04   TOTAL HIP ARTHROPLASTY Right 05/29/2023   Procedure: RIGHT TOTAL HIP ARTHROPLASTY ANTERIOR APPROACH;  Surgeon: Ollen Gross, MD;  Location: [facility];  Service: Orthopedics;  Laterality: Right;   TOTAL KNEE ARTHROPLASTY  06/04/2011   Procedure: TOTAL KNEE ARTHROPLASTY;  Surgeon: Loanne Drilling;  Location: [facility];  Service: Orthopedics;  Laterality: Right;   TOTAL KNEE ARTHROPLASTY Left 08/08/2015   Procedure: TOTAL LEFT KNEE ARTHROPLASTY;  Surgeon: Ollen Gross, MD;  Location: [facility];  Service:  Orthopedics;  Laterality: Left;   Patient Active Problem List   Diagnosis Date Noted   OA (osteoarthritis) of hip 05/29/2023   Osteoarthritis of right hip 05/29/2023   Chest pain 02/10/2022   Asthma, chronic 02/10/2022   Hyperlipidemia 02/10/2022   Essential hypertension 02/10/2022   UTI (urinary tract infection) 02/10/2022   OA (osteoarthritis) of knee 08/08/2015   Osteoarthritis of right knee 06/04/2011    PCP: Gardiner Fanti  REFERRING PROVIDER: Eartha Inch  REFERRING DIAG: Z51.89: Encounter for other specified aftercare  R THA anterior approach  THERAPY DIAG:  Status post total replacement of right hip  Stiffness of right hip, not elsewhere classified  Pain in right hip  Muscle weakness (generalized)  Other abnormalities of gait and mobility  Rationale for Evaluation and Treatment: Rehabilitation  ONSET DATE: 05/29/23  SUBJECTIVE:   SUBJECTIVE STATEMENT: It has been 4 weeks since the surgery. I am not comfortable walking with a cane just yet but I do some walking without the walker. I am feeling much better since the surgery.   PERTINENT HISTORY: HPI: Dawn Collins, 77 y.o. female, has a history of pain and functional disability in the right hip due to arthritis and patient has  failed non-surgical conservative treatments for greater than 12 weeks to include use of assistive devices and activity modification. Onset of symptoms was gradual, starting 4 years ago with gradually worsening course since that time. The patient noted no past surgery on the right hip. Patient currently rates pain in the right hip at 8 out of 10 with activity. Patient has worsening of pain with activity and weight bearing, trendelenberg gait, and pain that interfers with activities of daily living. Patient has evidence of periarticular osteophytes and joint space narrowing by imaging studies. This condition presents safety issues increasing the risk of falls. There is no current active  infection.  THA took place 05/29/23   PAIN:  Are you having pain? Yes: NPRS scale: 6/10 Pain location: R hip and down my thigh Pain description: feels numb, sore  Aggravating factors: lifting my leg up kind of hurts, walking like it hurts  Relieving factors: Tylenol, ice sometimes   PRECAUTIONS: Anterior hip - Do not step backwards with surgical leg. No hip extension. - Do not allow surgical leg to externally rotate (turn outwards). - Do not cross your legs. Use a pillow between legs when rolling. - Sleep on your surgical side when side lying. - No bending past 90d  RED FLAGS: None   WEIGHT BEARING RESTRICTIONS: No  FALLS:  Has patient fallen in last 6 months? No  LIVING ENVIRONMENT: Lives with: lives alone Lives in: House/apartment Stairs: No Has following equipment at home: Single point cane and Wheelchair (manual)  OCCUPATION: Retired from doing hair for 43 years   PLOF: Independent and Independent with basic ADLs  PATIENT GOALS: be able to walk straight again, and get my posture upright   NEXT MD VISIT: 07/09/23  OBJECTIVE:  Note: Objective measures were completed at Evaluation unless otherwise noted.  DIAGNOSTIC FINDINGS: INDINGS: FINDINGS: Right hip arthroplasty in expected alignment. No periprosthetic lucency or fracture. Recent postsurgical change includes air and edema in the soft tissues.   IMPRESSION: Right hip arthroplasty without immediate postoperative complication.  Two fluoroscopic spot views of the pelvis and right hip obtained in the operating room. Images during hip arthroplasty. Fluoroscopy time 7 seconds. Dose 1 mGy.   IMPRESSION: Intraoperative fluoroscopy during right hip arthroplasty.   COGNITION: Overall cognitive status: Within functional limits for tasks assessed     SENSATION: WFL  POSTURE: rounded shoulders, forward head, and flexed trunk   PALPATION: Some TTP and soreness in R hip   LOWER EXTREMITY ROM:  Active ROM  Right eval Left eval  Hip flexion Pain >90d    Hip extension    Hip abduction 30   Hip adduction    Hip internal rotation 25   Hip external rotation 30   Knee flexion WFL   Knee extension WFL   Ankle dorsiflexion    Ankle plantarflexion    Ankle inversion    Ankle eversion     (Blank rows = not tested)  LOWER EXTREMITY MMT:  MMT Right eval Left eval  Hip flexion 2+ with pain 4-  Hip extension    Hip abduction 4- 4  Hip adduction 4 4  Hip internal rotation 4-   Hip external rotation 2+ with pain   Knee flexion    Knee extension    Ankle dorsiflexion    Ankle plantarflexion    Ankle inversion    Ankle eversion     (Blank rows = not tested)  FUNCTIONAL TESTS:  5 times sit to stand: 20.55s Timed up and go (  TUG): 19.50s  GAIT: Distance walked: in clinic distances Assistive device utilized: Single point cane and Wheelchair (manual) Level of assistance: Modified independence Comments: antalgic gait, decrease stance time and step length with RLE, decreased hip flexion on RLE   TODAY'S TREATMENT:                                                                                                                              DATE: EVAL 06/26/23    PATIENT EDUCATION:  Education details: POC and HEP  Person educated: Patient Education method: Explanation Education comprehension: verbalized understanding  HOME EXERCISE PROGRAM: Access Code: [email or web address] URL: [email or web address] Date: 06/26/2023 Prepared by: Cassie Freer  Exercises - Standing Hip Abduction with Counter Support  - 1 x daily - 7 x weekly - 2 sets - 10 reps - Standing March with Counter Support  - 1 x daily - 7 x weekly - 2 sets - 10 reps - Sit to Stand  - 1 x daily - 7 x weekly - 2 sets - 10 reps - Seated Hip Abduction with Resistance  - 1 x daily - 7 x weekly - 2 sets - 10 reps - Seated Hip Adduction Isometrics with Ball  - 1 x daily - 7 x weekly - 2 sets - 10 reps  ASSESSMENT:  CLINICAL  IMPRESSION: Patient is a 77 y.o. female who was seen today for physical therapy evaluation and treatment for R THA on 05/29/23. At the time of eval she is 4 weeks post op and ambulating with a rolling walker. She is doing some household ambulation without it. Patient presents with pain and decrease strength and motion in her R hip. She has some pain with hip flexion and hip abduction exercises. She also has pain in her low back. Patient will benefit from skilled PT to address her R hip impairments to be able to return to PLOF and do her daily activities without pain.   OBJECTIVE IMPAIRMENTS: Abnormal gait, decreased mobility, difficulty walking, decreased ROM, decreased strength, and pain.   ACTIVITY LIMITATIONS: lifting, bending, sitting, standing, squatting, stairs, transfers, and locomotion level  PARTICIPATION LIMITATIONS: cleaning, laundry, driving, shopping, community activity, and yard work  [facility] POTENTIAL: Good  CLINICAL DECISION MAKING: Stable/uncomplicated  EVALUATION COMPLEXITY: Low   GOALS: Goals reviewed with patient? Yes  SHORT TERM GOALS: Target date: 08/07/23  Patient will be independent with initial HEP. Baseline: given 06/26/23 Goal status: INITIAL  2.  Patient will complete TUG < 14s without AD Baseline: 19.50s Goal status: INITIAL   LONG TERM GOALS: Target date: 09/18/23  Patient will be independent with advanced/ongoing HEP to improve outcomes and carryover.  Goal status: INITIAL  2.  Patient will report at least 75% improvement in R hip pain to improve QOL. Baseline: 6/10 pain Goal status: INITIAL  3.  Patient will demonstrate improved functional LE strength as demonstrated by 4/5 or better in hip MMT. Baseline: see chart above Goal status: INITIAL  4.  Patient will be able to ambulate 600' with LRAD and normal gait pattern without increased pain to access community.  Baseline: using RW , antalgic pattern Goal status: INITIAL  5.  Patient will  complete 5xSTS < 15s from chair height Baseline: 20.55s Goal status: INITIAL  PLAN:  PT FREQUENCY: 2x/week  PT DURATION: 12 weeks  PLANNED INTERVENTIONS: 97110-Therapeutic exercises, 97530- Therapeutic activity, 97112- Neuromuscular re-education, 97535- Self Care, 17616- Manual therapy, [phone]- Gait training, [phone]- Vasopneumatic device, Patient/Family education, Balance training, Stair training, Joint mobilization, Scar mobilization, Cryotherapy, and Moist heat  PLAN FOR NEXT SESSION: R hip strengthening, gait training with cane and without, would like heat for her low back next visit   Cassie Freer, PT 06/26/2023, 5:09 PM

## 2023-06-26 ENCOUNTER — Ambulatory Visit: Payer: Medicare Other | Attending: Student

## 2023-06-26 DIAGNOSIS — M6281 Muscle weakness (generalized): Secondary | ICD-10-CM | POA: Insufficient documentation

## 2023-06-26 DIAGNOSIS — R2689 Other abnormalities of gait and mobility: Secondary | ICD-10-CM | POA: Diagnosis present

## 2023-06-26 DIAGNOSIS — M25551 Pain in right hip: Secondary | ICD-10-CM | POA: Diagnosis present

## 2023-06-26 DIAGNOSIS — Z96641 Presence of right artificial hip joint: Secondary | ICD-10-CM | POA: Diagnosis present

## 2023-06-26 DIAGNOSIS — M25651 Stiffness of right hip, not elsewhere classified: Secondary | ICD-10-CM | POA: Diagnosis present

## 2023-06-26 IMAGING — DX DG CHEST 1V PORT
1 series · 1 of 1 positions shown · non-contrast
Comparison: 10/16/2018

CLINICAL DATA: Dyspnea on exertion.

EXAM:
PORTABLE CHEST 1 VIEW

[chest ap]
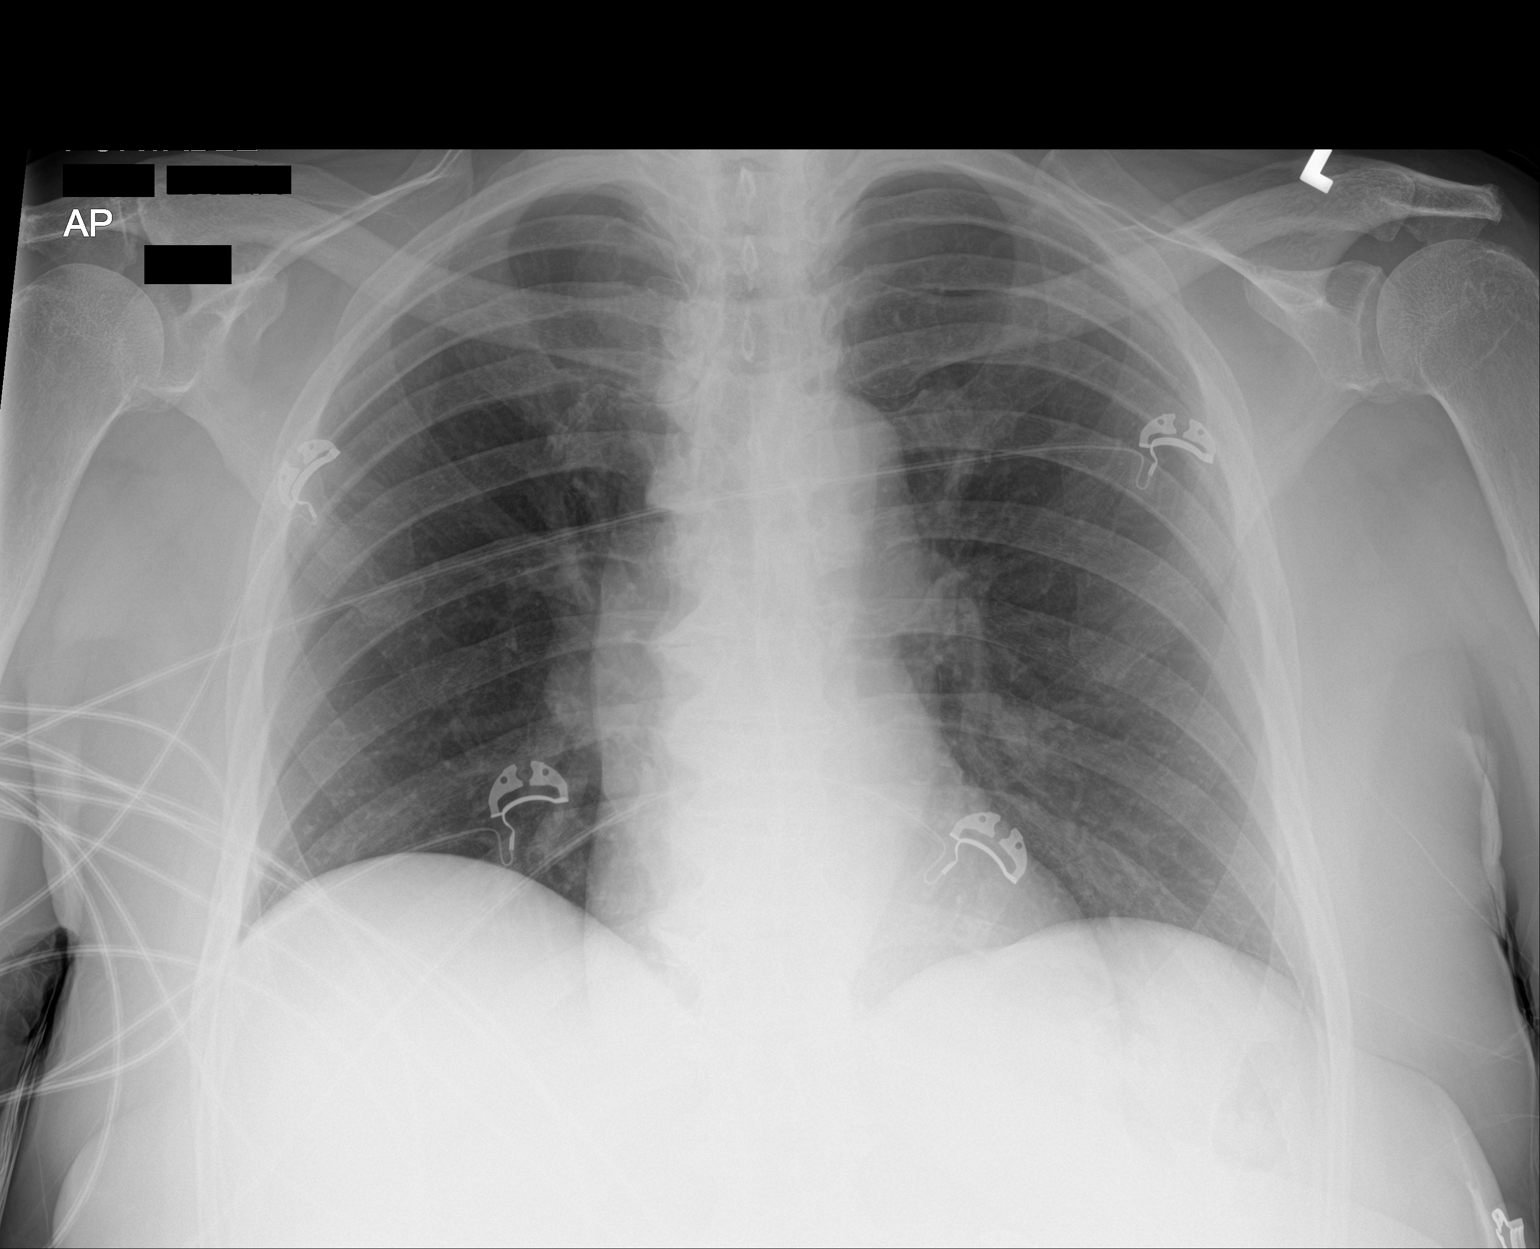

[1 of 1 positions shown; findings below may reference images not displayed]

FINDINGS: Both lungs are clear. Negative for a pneumothorax. Heart and
mediastinum are within normal limits. Trachea is midline. Bone
structures are intact.
IMPRESSION: No active disease.

## 2023-07-01 ENCOUNTER — Ambulatory Visit: Payer: Medicare Other | Admitting: Physical Therapy

## 2023-07-01 ENCOUNTER — Encounter: Payer: Self-pay | Admitting: Physical Therapy

## 2023-07-01 DIAGNOSIS — M25551 Pain in right hip: Secondary | ICD-10-CM

## 2023-07-01 DIAGNOSIS — Z96641 Presence of right artificial hip joint: Secondary | ICD-10-CM

## 2023-07-01 DIAGNOSIS — M6281 Muscle weakness (generalized): Secondary | ICD-10-CM

## 2023-07-01 DIAGNOSIS — M25651 Stiffness of right hip, not elsewhere classified: Secondary | ICD-10-CM

## 2023-07-01 NOTE — Therapy (Signed)
OUTPATIENT PHYSICAL THERAPY LOWER EXTREMITY    Patient Name: Dawn Collins MRN: [id number] DOB:1946/03/07, 77 y.o., female Today's Date: 07/01/2023  END OF SESSION:  PT End of Session - 07/01/23 1600     Visit Number 2    Date for PT Re-Evaluation 09/18/23    PT Start Time 1600    PT Stop Time 1645    PT Time Calculation (min) 45 min    Activity Tolerance Patient limited by pain;Patient tolerated treatment well    Behavior During Therapy [facility] for tasks assessed/performed             Past Medical History:  Diagnosis Date   Arthritis    knees, back.   Asthma    2 weeks cold,no problems now-well controlled   Bursitis, knee    Bil. Hips, not knee   Cancer (HCC)    Complication of anesthesia    slow to wake up   Diverticulitis of colon 05/28/2011   no current problems   GERD (gastroesophageal reflux disease)    tx. pantoprazole   History of hiatal hernia    Hypertension    Multiple thyroid nodules    Myocardial infarction (HCC)    Pneumonia    PONV (postoperative nausea and vomiting)    Sleep apnea    No cpap now -3 months last due to insurance reasons   Past Surgical History:  Procedure Laterality Date   ABDOMINAL HYSTERECTOMY     partial hyst   BREAST BIOPSY     several- all benign   CARDIAC CATHETERIZATION     10'11   CERVICAL DISC SURGERY     1977   FOOT SURGERY     Bilateral   KNEE ARTHROSCOPY     right '04   TOTAL HIP ARTHROPLASTY Right 05/29/2023   Procedure: RIGHT TOTAL HIP ARTHROPLASTY ANTERIOR APPROACH;  Surgeon: Ollen Gross, MD;  Location: [facility];  Service: Orthopedics;  Laterality: Right;   TOTAL KNEE ARTHROPLASTY  06/04/2011   Procedure: TOTAL KNEE ARTHROPLASTY;  Surgeon: Loanne Drilling;  Location: [facility];  Service: Orthopedics;  Laterality: Right;   TOTAL KNEE ARTHROPLASTY Left 08/08/2015   Procedure: TOTAL LEFT KNEE ARTHROPLASTY;  Surgeon: Ollen Gross, MD;  Location: [facility];  Service: Orthopedics;  Laterality: Left;   Patient Active  Problem List   Diagnosis Date Noted   OA (osteoarthritis) of hip 05/29/2023   Osteoarthritis of right hip 05/29/2023   Chest pain 02/10/2022   Asthma, chronic 02/10/2022   Hyperlipidemia 02/10/2022   Essential hypertension 02/10/2022   UTI (urinary tract infection) 02/10/2022   OA (osteoarthritis) of knee 08/08/2015   Osteoarthritis of right knee 06/04/2011    PCP: Gardiner Fanti  REFERRING PROVIDER: Eartha Inch  REFERRING DIAG: Z51.89: Encounter for other specified aftercare  R THA anterior approach  THERAPY DIAG:  Status post total replacement of right hip  Stiffness of right hip, not elsewhere classified  Pain in right hip  Muscle weakness (generalized)  Rationale for Evaluation and Treatment: Rehabilitation  ONSET DATE: 05/29/23  SUBJECTIVE:   SUBJECTIVE STATEMENT: "Im ok, trying to get back to normal"  PERTINENT HISTORY: HPI: Dawn Collins, 77 y.o. female, has a history of pain and functional disability in the right hip due to arthritis and patient has failed non-surgical conservative treatments for greater than 12 weeks to include use of assistive devices and activity modification. Onset of symptoms was gradual, starting 4 years ago with gradually worsening course since that time. The patient noted no past surgery  on the right hip. Patient currently rates pain in the right hip at 8 out of 10 with activity. Patient has worsening of pain with activity and weight bearing, trendelenberg gait, and pain that interfers with activities of daily living. Patient has evidence of periarticular osteophytes and joint space narrowing by imaging studies. This condition presents safety issues increasing the risk of falls. There is no current active infection.  THA took place 05/29/23   PAIN:  Are you having pain? Yes: NPRS scale: 5/10 Pain location: R hip and down my thigh Pain description: feels numb, sore  Aggravating factors: lifting my leg up kind of hurts, walking like it  hurts  Relieving factors: Tylenol, ice sometimes   PRECAUTIONS: Anterior hip - Do not step backwards with surgical leg. No hip extension. - Do not allow surgical leg to externally rotate (turn outwards). - Do not cross your legs. Use a pillow between legs when rolling. - Sleep on your surgical side when side lying. - No bending past 90d  RED FLAGS: None   WEIGHT BEARING RESTRICTIONS: No  FALLS:  Has patient fallen in last 6 months? No  LIVING ENVIRONMENT: Lives with: lives alone Lives in: House/apartment Stairs: No Has following equipment at home: Single point cane and Wheelchair (manual)  OCCUPATION: Retired from doing hair for 43 years   PLOF: Independent and Independent with basic ADLs  PATIENT GOALS: be able to walk straight again, and get my posture upright   NEXT MD VISIT: 07/09/23  OBJECTIVE:  Note: Objective measures were completed at Evaluation unless otherwise noted.  DIAGNOSTIC FINDINGS: INDINGS: FINDINGS: Right hip arthroplasty in expected alignment. No periprosthetic lucency or fracture. Recent postsurgical change includes air and edema in the soft tissues.   IMPRESSION: Right hip arthroplasty without immediate postoperative complication.  Two fluoroscopic spot views of the pelvis and right hip obtained in the operating room. Images during hip arthroplasty. Fluoroscopy time 7 seconds. Dose 1 mGy.   IMPRESSION: Intraoperative fluoroscopy during right hip arthroplasty.   COGNITION: Overall cognitive status: Within functional limits for tasks assessed     SENSATION: WFL  POSTURE: rounded shoulders, forward head, and flexed trunk   PALPATION: Some TTP and soreness in R hip   LOWER EXTREMITY ROM:  Active ROM Right eval Left eval  Hip flexion Pain >90d    Hip extension    Hip abduction 30   Hip adduction    Hip internal rotation 25   Hip external rotation 30   Knee flexion WFL   Knee extension WFL   Ankle dorsiflexion    Ankle  plantarflexion    Ankle inversion    Ankle eversion     (Blank rows = not tested)  LOWER EXTREMITY MMT:  MMT Right eval Left eval  Hip flexion 2+ with pain 4-  Hip extension    Hip abduction 4- 4  Hip adduction 4 4  Hip internal rotation 4-   Hip external rotation 2+ with pain   Knee flexion    Knee extension    Ankle dorsiflexion    Ankle plantarflexion    Ankle inversion    Ankle eversion     (Blank rows = not tested)  FUNCTIONAL TESTS:  5 times sit to stand: 20.55s Timed up and go (TUG): 19.50s  GAIT: Distance walked: in clinic distances Assistive device utilized: Single point cane and Wheelchair (manual) Level of assistance: Modified independence Comments: antalgic gait, decrease stance time and step length with RLE, decreased hip flexion on RLE  TODAY'S TREATMENT:                                                                                                                              DATE:  07/01/23 NuStep L5 x 5 min Hip add ball squeeze  Hip abd green 2x10 Seated march 2lb 2x5 LAQ 2lb 2x10   MHP to lumbar spine x10 min   EVAL 06/26/23   PATIENT EDUCATION:  Education details: POC and HEP  Person educated: Patient Education method: Explanation Education comprehension: verbalized understanding  HOME EXERCISE PROGRAM: Access Code: [email or web address] URL: [email or web address] Date: 06/26/2023 Prepared by: Cassie Freer  Exercises - Standing Hip Abduction with Counter Support  - 1 x daily - 7 x weekly - 2 sets - 10 reps - Standing March with Counter Support  - 1 x daily - 7 x weekly - 2 sets - 10 reps - Sit to Stand  - 1 x daily - 7 x weekly - 2 sets - 10 reps - Seated Hip Abduction with Resistance  - 1 x daily - 7 x weekly - 2 sets - 10 reps - Seated Hip Adduction Isometrics with Ball  - 1 x daily - 7 x weekly - 2 sets - 10 reps  ASSESSMENT:  CLINICAL IMPRESSION: Patient is a 77 y.o. female who was seen today for physical therapy treatment  for R THA on 05/29/23. She enters ambulating with small base quad cane. Cane height and base had to be corrected. Initiated light LE strengthening interventions. Some lateral R thigh numbness reported with abduction  She has some pain with hip flexion  initially but it appeared to ease up as reps progressed. Decrease TKE noted with LAQ on both LE. She also has pain in her low back and requested MHP. Patient will benefit from skilled PT to address her R hip impairments to be able to return to PLOF and do her daily activities without pain.   OBJECTIVE IMPAIRMENTS: Abnormal gait, decreased mobility, difficulty walking, decreased ROM, decreased strength, and pain.   ACTIVITY LIMITATIONS: lifting, bending, sitting, standing, squatting, stairs, transfers, and locomotion level  PARTICIPATION LIMITATIONS: cleaning, laundry, driving, shopping, community activity, and yard work  [facility] POTENTIAL: Good  CLINICAL DECISION MAKING: Stable/uncomplicated  EVALUATION COMPLEXITY: Low   GOALS: Goals reviewed with patient? Yes  SHORT TERM GOALS: Target date: 08/07/23  Patient will be independent with initial HEP. Baseline: given 06/26/23 Goal status: INITIAL  2.  Patient will complete TUG < 14s without AD Baseline: 19.50s Goal status: INITIAL   LONG TERM GOALS: Target date: 09/18/23  Patient will be independent with advanced/ongoing HEP to improve outcomes and carryover.  Goal status: INITIAL  2.  Patient will report at least 75% improvement in R hip pain to improve QOL. Baseline: 6/10 pain Goal status: INITIAL  3.  Patient will demonstrate improved functional LE strength as demonstrated by 4/5 or better in hip MMT. Baseline: see chart above Goal status: INITIAL  4.  Patient will be able to ambulate 600' with LRAD and normal gait pattern without increased pain to access community.  Baseline: using RW , antalgic pattern Goal status: INITIAL  5.  Patient will complete 5xSTS < 15s from chair  height Baseline: 20.55s Goal status: INITIAL  PLAN:  PT FREQUENCY: 2x/week  PT DURATION: 12 weeks  PLANNED INTERVENTIONS: 97110-Therapeutic exercises, 97530- Therapeutic activity, 97112- Neuromuscular re-education, 97535- Self Care, [id number]- Manual therapy, [phone]- Gait training, [phone]- Vasopneumatic device, Patient/Family education, Balance training, Stair training, Joint mobilization, Scar mobilization, Cryotherapy, and Moist heat  PLAN FOR NEXT SESSION: R hip strengthening, gait training with cane and without, would like heat for her low back next visit   Grayce Sessions, PTA 07/01/2023, 4:04 PM

## 2023-07-03 ENCOUNTER — Ambulatory Visit: Payer: Medicare Other

## 2023-07-03 DIAGNOSIS — M25551 Pain in right hip: Secondary | ICD-10-CM

## 2023-07-03 DIAGNOSIS — R2689 Other abnormalities of gait and mobility: Secondary | ICD-10-CM

## 2023-07-03 DIAGNOSIS — M25651 Stiffness of right hip, not elsewhere classified: Secondary | ICD-10-CM

## 2023-07-03 DIAGNOSIS — Z96641 Presence of right artificial hip joint: Secondary | ICD-10-CM | POA: Diagnosis not present

## 2023-07-03 DIAGNOSIS — M6281 Muscle weakness (generalized): Secondary | ICD-10-CM

## 2023-07-03 NOTE — Therapy (Signed)
OUTPATIENT PHYSICAL THERAPY LOWER EXTREMITY    Patient Name: Dawn Collins MRN: [id number] DOB:02/19/1946, 77 y.o., female Today's Date: 07/03/2023  END OF SESSION:  PT End of Session - 07/03/23 1350     Visit Number 3    Date for PT Re-Evaluation 09/18/23    PT Start Time 1350    PT Stop Time 1435    PT Time Calculation (min) 45 min    Activity Tolerance Patient limited by pain;Patient tolerated treatment well    Behavior During Therapy [facility] for tasks assessed/performed              Past Medical History:  Diagnosis Date   Arthritis    knees, back.   Asthma    2 weeks cold,no problems now-well controlled   Bursitis, knee    Bil. Hips, not knee   Cancer (HCC)    Complication of anesthesia    slow to wake up   Diverticulitis of colon 05/28/2011   no current problems   GERD (gastroesophageal reflux disease)    tx. pantoprazole   History of hiatal hernia    Hypertension    Multiple thyroid nodules    Myocardial infarction (HCC)    Pneumonia    PONV (postoperative nausea and vomiting)    Sleep apnea    No cpap now -3 months last due to insurance reasons   Past Surgical History:  Procedure Laterality Date   ABDOMINAL HYSTERECTOMY     partial hyst   BREAST BIOPSY     several- all benign   CARDIAC CATHETERIZATION     10'11   CERVICAL DISC SURGERY     1977   FOOT SURGERY     Bilateral   KNEE ARTHROSCOPY     right '04   TOTAL HIP ARTHROPLASTY Right 05/29/2023   Procedure: RIGHT TOTAL HIP ARTHROPLASTY ANTERIOR APPROACH;  Surgeon: Ollen Gross, MD;  Location: [facility];  Service: Orthopedics;  Laterality: Right;   TOTAL KNEE ARTHROPLASTY  06/04/2011   Procedure: TOTAL KNEE ARTHROPLASTY;  Surgeon: Loanne Drilling;  Location: [facility];  Service: Orthopedics;  Laterality: Right;   TOTAL KNEE ARTHROPLASTY Left 08/08/2015   Procedure: TOTAL LEFT KNEE ARTHROPLASTY;  Surgeon: Ollen Gross, MD;  Location: [facility];  Service: Orthopedics;  Laterality: Left;   Patient  Active Problem List   Diagnosis Date Noted   OA (osteoarthritis) of hip 05/29/2023   Osteoarthritis of right hip 05/29/2023   Chest pain 02/10/2022   Asthma, chronic 02/10/2022   Hyperlipidemia 02/10/2022   Essential hypertension 02/10/2022   UTI (urinary tract infection) 02/10/2022   OA (osteoarthritis) of knee 08/08/2015   Osteoarthritis of right knee 06/04/2011    PCP: Gardiner Fanti  REFERRING PROVIDER: Eartha Inch  REFERRING DIAG: Z51.89: Encounter for other specified aftercare  R THA anterior approach  THERAPY DIAG:  Status post total replacement of right hip  Stiffness of right hip, not elsewhere classified  Pain in right hip  Muscle weakness (generalized)  Other abnormalities of gait and mobility  Rationale for Evaluation and Treatment: Rehabilitation  ONSET DATE: 05/29/23  SUBJECTIVE:   SUBJECTIVE STATEMENT: Hip is getting better, still some pain but it is nothing like it was.   PERTINENT HISTORY: HPI: Dawn Collins, 77 y.o. female, has a history of pain and functional disability in the right hip due to arthritis and patient has failed non-surgical conservative treatments for greater than 12 weeks to include use of assistive devices and activity modification. Onset of symptoms was gradual, starting 4  years ago with gradually worsening course since that time. The patient noted no past surgery on the right hip. Patient currently rates pain in the right hip at 8 out of 10 with activity. Patient has worsening of pain with activity and weight bearing, trendelenberg gait, and pain that interfers with activities of daily living. Patient has evidence of periarticular osteophytes and joint space narrowing by imaging studies. This condition presents safety issues increasing the risk of falls. There is no current active infection.  THA took place 05/29/23   PAIN:  Are you having pain? Yes: NPRS scale: 5/10 Pain location: R hip and down my thigh Pain description: feels  numb, sore  Aggravating factors: lifting my leg up kind of hurts, walking like it hurts  Relieving factors: Tylenol, ice sometimes   PRECAUTIONS: Anterior hip - Do not step backwards with surgical leg. No hip extension. - Do not allow surgical leg to externally rotate (turn outwards). - Do not cross your legs. Use a pillow between legs when rolling. - Sleep on your surgical side when side lying. - No bending past 90d  RED FLAGS: None   WEIGHT BEARING RESTRICTIONS: No  FALLS:  Has patient fallen in last 6 months? No  LIVING ENVIRONMENT: Lives with: lives alone Lives in: House/apartment Stairs: No Has following equipment at home: Single point cane and Wheelchair (manual)  OCCUPATION: Retired from doing hair for 43 years   PLOF: Independent and Independent with basic ADLs  PATIENT GOALS: be able to walk straight again, and get my posture upright   NEXT MD VISIT: 07/09/23  OBJECTIVE:  Note: Objective measures were completed at Evaluation unless otherwise noted.  DIAGNOSTIC FINDINGS: INDINGS: FINDINGS: Right hip arthroplasty in expected alignment. No periprosthetic lucency or fracture. Recent postsurgical change includes air and edema in the soft tissues.   IMPRESSION: Right hip arthroplasty without immediate postoperative complication.  Two fluoroscopic spot views of the pelvis and right hip obtained in the operating room. Images during hip arthroplasty. Fluoroscopy time 7 seconds. Dose 1 mGy.   IMPRESSION: Intraoperative fluoroscopy during right hip arthroplasty.   COGNITION: Overall cognitive status: Within functional limits for tasks assessed     SENSATION: WFL  POSTURE: rounded shoulders, forward head, and flexed trunk   PALPATION: Some TTP and soreness in R hip   LOWER EXTREMITY ROM:  Active ROM Right eval Left eval  Hip flexion Pain >90d    Hip extension    Hip abduction 30   Hip adduction    Hip internal rotation 25   Hip external rotation 30    Knee flexion WFL   Knee extension WFL   Ankle dorsiflexion    Ankle plantarflexion    Ankle inversion    Ankle eversion     (Blank rows = not tested)  LOWER EXTREMITY MMT:  MMT Right eval Left eval  Hip flexion 2+ with pain 4-  Hip extension    Hip abduction 4- 4  Hip adduction 4 4  Hip internal rotation 4-   Hip external rotation 2+ with pain   Knee flexion    Knee extension    Ankle dorsiflexion    Ankle plantarflexion    Ankle inversion    Ankle eversion     (Blank rows = not tested)  FUNCTIONAL TESTS:  5 times sit to stand: 20.55s Timed up and go (TUG): 19.50s  GAIT: Distance walked: in clinic distances Assistive device utilized: Single point cane and Wheelchair (manual) Level of assistance: Modified independence Comments: antalgic  gait, decrease stance time and step length with RLE, decreased hip flexion on RLE   TODAY'S TREATMENT:                                                                                                                              DATE:  07/03/23 NuStep L4 x75mins STS x10, then with OHP red ball x10 LAQ 3# 2x10 HS curls green 2x10 Side steps against mat Standing marching 20 reps alt  Standing hip abd x10 each side  MHP to lumbar spine x10 min while LE stretching   07/01/23 NuStep L5 x 5 min Hip add ball squeeze  Hip abd green 2x10 Seated march 2lb 2x5 LAQ 2lb 2x10   MHP to lumbar spine x10 min   EVAL 06/26/23   PATIENT EDUCATION:  Education details: POC and HEP  Person educated: Patient Education method: Explanation Education comprehension: verbalized understanding  HOME EXERCISE PROGRAM: Access Code: [email or web address] URL: [email or web address] Date: 06/26/2023 Prepared by: Cassie Freer  Exercises - Standing Hip Abduction with Counter Support  - 1 x daily - 7 x weekly - 2 sets - 10 reps - Standing March with Counter Support  - 1 x daily - 7 x weekly - 2 sets - 10 reps - Sit to Stand  - 1 x daily - 7 x weekly  - 2 sets - 10 reps - Seated Hip Abduction with Resistance  - 1 x daily - 7 x weekly - 2 sets - 10 reps - Seated Hip Adduction Isometrics with Ball  - 1 x daily - 7 x weekly - 2 sets - 10 reps  ASSESSMENT:  CLINICAL IMPRESSION: Patient is a 77 y.o. female who was seen today for physical therapy treatment for R THA on 05/29/23. She enters ambulating with small base quad cane. Cane height and base had to be corrected again, it was up too high. Continued with LE strengthening interventions and some stretching. She has some knee and back pain with interventions but hip is doing well. Patient will benefit from skilled PT to address her R hip impairments to be able to return to PLOF and do her daily activities without pain.   OBJECTIVE IMPAIRMENTS: Abnormal gait, decreased mobility, difficulty walking, decreased ROM, decreased strength, and pain.   ACTIVITY LIMITATIONS: lifting, bending, sitting, standing, squatting, stairs, transfers, and locomotion level  PARTICIPATION LIMITATIONS: cleaning, laundry, driving, shopping, community activity, and yard work  [facility] POTENTIAL: Good  CLINICAL DECISION MAKING: Stable/uncomplicated  EVALUATION COMPLEXITY: Low   GOALS: Goals reviewed with patient? Yes  SHORT TERM GOALS: Target date: 08/07/23  Patient will be independent with initial HEP. Baseline: given 06/26/23 Goal status: INITIAL  2.  Patient will complete TUG < 14s without AD Baseline: 19.50s Goal status: INITIAL   LONG TERM GOALS: Target date: 09/18/23  Patient will be independent with advanced/ongoing HEP to improve outcomes and carryover.  Goal status: INITIAL  2.  Patient will report at least 75% improvement in  R hip pain to improve QOL. Baseline: 6/10 pain Goal status: INITIAL  3.  Patient will demonstrate improved functional LE strength as demonstrated by 4/5 or better in hip MMT. Baseline: see chart above Goal status: INITIAL  4.  Patient will be able to ambulate 600' with  LRAD and normal gait pattern without increased pain to access community.  Baseline: using RW , antalgic pattern Goal status: INITIAL  5.  Patient will complete 5xSTS < 15s from chair height Baseline: 20.55s Goal status: INITIAL  PLAN:  PT FREQUENCY: 2x/week  PT DURATION: 12 weeks  PLANNED INTERVENTIONS: 97110-Therapeutic exercises, 97530- Therapeutic activity, 97112- Neuromuscular re-education, 97535- Self Care, [id number]- Manual therapy, [phone]- Gait training, [phone]- Vasopneumatic device, Patient/Family education, Balance training, Stair training, Joint mobilization, Scar mobilization, Cryotherapy, and Moist heat  PLAN FOR NEXT SESSION: R hip strengthening, gait training with cane and without, would like heat for her low back next visit   Cassie Freer, PT 07/03/2023, 2:34 PM

## 2023-07-08 ENCOUNTER — Encounter: Payer: Self-pay | Admitting: Physical Therapy

## 2023-07-08 ENCOUNTER — Ambulatory Visit: Payer: Medicare Other | Admitting: Physical Therapy

## 2023-07-08 DIAGNOSIS — Z96641 Presence of right artificial hip joint: Secondary | ICD-10-CM

## 2023-07-08 DIAGNOSIS — M6281 Muscle weakness (generalized): Secondary | ICD-10-CM

## 2023-07-08 DIAGNOSIS — M25651 Stiffness of right hip, not elsewhere classified: Secondary | ICD-10-CM

## 2023-07-08 DIAGNOSIS — M25551 Pain in right hip: Secondary | ICD-10-CM

## 2023-07-08 NOTE — Therapy (Signed)
OUTPATIENT PHYSICAL THERAPY LOWER EXTREMITY    Patient Name: Dawn Collins MRN: [id number] DOB:1945/09/12, 77 y.o., female Today's Date: 07/08/2023  END OF SESSION:  PT End of Session - 07/08/23 1558     Visit Number 4    Date for PT Re-Evaluation 09/18/23    PT Start Time 1600    PT Stop Time 1645    PT Time Calculation (min) 45 min    Activity Tolerance Patient limited by pain;Patient tolerated treatment well    Behavior During Therapy [facility] for tasks assessed/performed              Past Medical History:  Diagnosis Date   Arthritis    knees, back.   Asthma    2 weeks cold,no problems now-well controlled   Bursitis, knee    Bil. Hips, not knee   Cancer (HCC)    Complication of anesthesia    slow to wake up   Diverticulitis of colon 05/28/2011   no current problems   GERD (gastroesophageal reflux disease)    tx. pantoprazole   History of hiatal hernia    Hypertension    Multiple thyroid nodules    Myocardial infarction (HCC)    Pneumonia    PONV (postoperative nausea and vomiting)    Sleep apnea    No cpap now -3 months last due to insurance reasons   Past Surgical History:  Procedure Laterality Date   ABDOMINAL HYSTERECTOMY     partial hyst   BREAST BIOPSY     several- all benign   CARDIAC CATHETERIZATION     10'11   CERVICAL DISC SURGERY     1977   FOOT SURGERY     Bilateral   KNEE ARTHROSCOPY     right '04   TOTAL HIP ARTHROPLASTY Right 05/29/2023   Procedure: RIGHT TOTAL HIP ARTHROPLASTY ANTERIOR APPROACH;  Surgeon: Ollen Gross, MD;  Location: [facility];  Service: Orthopedics;  Laterality: Right;   TOTAL KNEE ARTHROPLASTY  06/04/2011   Procedure: TOTAL KNEE ARTHROPLASTY;  Surgeon: Loanne Drilling;  Location: [facility];  Service: Orthopedics;  Laterality: Right;   TOTAL KNEE ARTHROPLASTY Left 08/08/2015   Procedure: TOTAL LEFT KNEE ARTHROPLASTY;  Surgeon: Ollen Gross, MD;  Location: [facility];  Service: Orthopedics;  Laterality: Left;   Patient  Active Problem List   Diagnosis Date Noted   OA (osteoarthritis) of hip 05/29/2023   Osteoarthritis of right hip 05/29/2023   Chest pain 02/10/2022   Asthma, chronic 02/10/2022   Hyperlipidemia 02/10/2022   Essential hypertension 02/10/2022   UTI (urinary tract infection) 02/10/2022   OA (osteoarthritis) of knee 08/08/2015   Osteoarthritis of right knee 06/04/2011    PCP: Gardiner Fanti  REFERRING PROVIDER: Eartha Inch  REFERRING DIAG: Z51.89: Encounter for other specified aftercare  R THA anterior approach  THERAPY DIAG:  Status post total replacement of right hip  Stiffness of right hip, not elsewhere classified  Pain in right hip  Muscle weakness (generalized)  Rationale for Evaluation and Treatment: Rehabilitation  ONSET DATE: 05/29/23  SUBJECTIVE:   SUBJECTIVE STATEMENT: "I feel ok"  PERTINENT HISTORY: HPI: Dawn Collins, 77 y.o. female, has a history of pain and functional disability in the right hip due to arthritis and patient has failed non-surgical conservative treatments for greater than 12 weeks to include use of assistive devices and activity modification. Onset of symptoms was gradual, starting 4 years ago with gradually worsening course since that time. The patient noted no past surgery on the right hip.  Patient currently rates pain in the right hip at 8 out of 10 with activity. Patient has worsening of pain with activity and weight bearing, trendelenberg gait, and pain that interfers with activities of daily living. Patient has evidence of periarticular osteophytes and joint space narrowing by imaging studies. This condition presents safety issues increasing the risk of falls. There is no current active infection.  THA took place 05/29/23   PAIN:  Are you having pain? Yes: NPRS scale: 3/10 Pain location: R hip and down my thigh Pain description: feels numb, sore  Aggravating factors: lifting my leg up kind of hurts, walking like it hurts  Relieving  factors: Tylenol, ice sometimes   PRECAUTIONS: Anterior hip - Do not step backwards with surgical leg. No hip extension. - Do not allow surgical leg to externally rotate (turn outwards). - Do not cross your legs. Use a pillow between legs when rolling. - Sleep on your surgical side when side lying. - No bending past 90d  RED FLAGS: None   WEIGHT BEARING RESTRICTIONS: No  FALLS:  Has patient fallen in last 6 months? No  LIVING ENVIRONMENT: Lives with: lives alone Lives in: House/apartment Stairs: No Has following equipment at home: Single point cane and Wheelchair (manual)  OCCUPATION: Retired from doing hair for 43 years   PLOF: Independent and Independent with basic ADLs  PATIENT GOALS: be able to walk straight again, and get my posture upright   NEXT MD VISIT: 07/09/23  OBJECTIVE:  Note: Objective measures were completed at Evaluation unless otherwise noted.  DIAGNOSTIC FINDINGS: INDINGS: FINDINGS: Right hip arthroplasty in expected alignment. No periprosthetic lucency or fracture. Recent postsurgical change includes air and edema in the soft tissues.   IMPRESSION: Right hip arthroplasty without immediate postoperative complication.  Two fluoroscopic spot views of the pelvis and right hip obtained in the operating room. Images during hip arthroplasty. Fluoroscopy time 7 seconds. Dose 1 mGy.   IMPRESSION: Intraoperative fluoroscopy during right hip arthroplasty.   COGNITION: Overall cognitive status: Within functional limits for tasks assessed     SENSATION: WFL  POSTURE: rounded shoulders, forward head, and flexed trunk   PALPATION: Some TTP and soreness in R hip   LOWER EXTREMITY ROM:  Active ROM Right eval Left eval  Hip flexion Pain >90d    Hip extension    Hip abduction 30   Hip adduction    Hip internal rotation 25   Hip external rotation 30   Knee flexion WFL   Knee extension WFL   Ankle dorsiflexion    Ankle plantarflexion    Ankle  inversion    Ankle eversion     (Blank rows = not tested)  LOWER EXTREMITY MMT:  MMT Right eval Left eval  Hip flexion 2+ with pain 4-  Hip extension    Hip abduction 4- 4  Hip adduction 4 4  Hip internal rotation 4-   Hip external rotation 2+ with pain   Knee flexion    Knee extension    Ankle dorsiflexion    Ankle plantarflexion    Ankle inversion    Ankle eversion     (Blank rows = not tested)  FUNCTIONAL TESTS:  5 times sit to stand: 20.55s Timed up and go (TUG): 19.50s  GAIT: Distance walked: in clinic distances Assistive device utilized: Single point cane and Wheelchair (manual) Level of assistance: Modified independence Comments: antalgic gait, decrease stance time and step length with RLE, decreased hip flexion on RLE   TODAY'S TREATMENT:  DATE:  07/08/23 NuStep L4 x34mins Hip Add ball squeeze Hip Abd green 2x10 S2S x15 some with UE HS curls 20lb 2x10 Leg Ext 5lb 2x10 Standing marches 2x10 MHP to lumbar spine x10 min   07/03/23 NuStep L4 x22mins STS x10, then with OHP red ball x10 LAQ 3# 2x10 HS curls green 2x10 Side steps against mat Standing marching 20 reps alt  Standing hip abd x10 each side  MHP to lumbar spine x10 min while LE stretching   07/01/23 NuStep L5 x 5 min Hip add ball squeeze  Hip abd green 2x10 Seated march 2lb 2x5 LAQ 2lb 2x10   MHP to lumbar spine x10 min   EVAL 06/26/23   PATIENT EDUCATION:  Education details: POC and HEP  Person educated: Patient Education method: Explanation Education comprehension: verbalized understanding  HOME EXERCISE PROGRAM: Access Code: [email or web address] URL: [email or web address] Date: 06/26/2023 Prepared by: Cassie Freer  Exercises - Standing Hip Abduction with Counter Support  - 1 x daily - 7 x weekly - 2 sets - 10 reps - Standing March with Counter Support   - 1 x daily - 7 x weekly - 2 sets - 10 reps - Sit to Stand  - 1 x daily - 7 x weekly - 2 sets - 10 reps - Seated Hip Abduction with Resistance  - 1 x daily - 7 x weekly - 2 sets - 10 reps - Seated Hip Adduction Isometrics with Ball  - 1 x daily - 7 x weekly - 2 sets - 10 reps  ASSESSMENT:  CLINICAL IMPRESSION: Patient is a 77 y.o. female who was seen today for physical therapy treatment for R THA on 05/29/23. She enters ambulating with SPC. Continued with LE strengthening interventions and some stretching. She has some back pain with sit to stands but hip is doing well. Decrease activity tolerance with functional interventions. Patient will benefit from skilled PT to address her R hip impairments to be able to return to PLOF and do her daily activities without pain.   OBJECTIVE IMPAIRMENTS: Abnormal gait, decreased mobility, difficulty walking, decreased ROM, decreased strength, and pain.   ACTIVITY LIMITATIONS: lifting, bending, sitting, standing, squatting, stairs, transfers, and locomotion level  PARTICIPATION LIMITATIONS: cleaning, laundry, driving, shopping, community activity, and yard work  [facility] POTENTIAL: Good  CLINICAL DECISION MAKING: Stable/uncomplicated  EVALUATION COMPLEXITY: Low   GOALS: Goals reviewed with patient? Yes  SHORT TERM GOALS: Target date: 08/07/23  Patient will be independent with initial HEP. Baseline: given 06/26/23 Goal status: INITIAL  2.  Patient will complete TUG < 14s without AD Baseline: 19.50s Goal status: INITIAL   LONG TERM GOALS: Target date: 09/18/23  Patient will be independent with advanced/ongoing HEP to improve outcomes and carryover.  Goal status: INITIAL  2.  Patient will report at least 75% improvement in R hip pain to improve QOL. Baseline: 6/10 pain Goal status: INITIAL  3.  Patient will demonstrate improved functional LE strength as demonstrated by 4/5 or better in hip MMT. Baseline: see chart above Goal status:  INITIAL  4.  Patient will be able to ambulate 600' with LRAD and normal gait pattern without increased pain to access community.  Baseline: using RW , antalgic pattern Goal status: INITIAL  5.  Patient will complete 5xSTS < 15s from chair height Baseline: 20.55s Goal status: INITIAL  PLAN:  PT FREQUENCY: 2x/week  PT DURATION: 12 weeks  PLANNED INTERVENTIONS: 97110-Therapeutic exercises, 97530- Therapeutic activity, [id number]- Neuromuscular re-education, 97535- Self Care, [id number]- Manual therapy, [id number]- Gait  training, 35573- Vasopneumatic device, Patient/Family education, Balance training, Stair training, Joint mobilization, Scar mobilization, Cryotherapy, and Moist heat  PLAN FOR NEXT SESSION: R hip strengthening, gait training with cane and without, would like heat for her low back next visit   Grayce Sessions, PTA 07/08/2023, 3:59 PM

## 2023-07-12 ENCOUNTER — Encounter: Payer: Self-pay | Admitting: Physical Therapy

## 2023-07-12 ENCOUNTER — Ambulatory Visit: Payer: Medicare Other | Admitting: Physical Therapy

## 2023-07-12 DIAGNOSIS — M25551 Pain in right hip: Secondary | ICD-10-CM

## 2023-07-12 DIAGNOSIS — M6281 Muscle weakness (generalized): Secondary | ICD-10-CM

## 2023-07-12 DIAGNOSIS — Z96641 Presence of right artificial hip joint: Secondary | ICD-10-CM

## 2023-07-12 DIAGNOSIS — M25651 Stiffness of right hip, not elsewhere classified: Secondary | ICD-10-CM

## 2023-07-12 DIAGNOSIS — R2689 Other abnormalities of gait and mobility: Secondary | ICD-10-CM

## 2023-07-12 NOTE — Therapy (Signed)
OUTPATIENT PHYSICAL THERAPY LOWER EXTREMITY    Patient Name: Dawn Collins MRN: [id number] DOB:30-Jun-1946, 77 y.o., female Today's Date: 07/12/2023  END OF SESSION:  PT End of Session - 07/12/23 0929     Visit Number 5    Date for PT Re-Evaluation 09/18/23    Authorization Type Medicare    PT Start Time 0929    PT Stop Time 1015    PT Time Calculation (min) 46 min    Activity Tolerance Patient limited by pain;Patient tolerated treatment well    Behavior During Therapy [facility] for tasks assessed/performed              Past Medical History:  Diagnosis Date   Arthritis    knees, back.   Asthma    2 weeks cold,no problems now-well controlled   Bursitis, knee    Bil. Hips, not knee   Cancer (HCC)    Complication of anesthesia    slow to wake up   Diverticulitis of colon 05/28/2011   no current problems   GERD (gastroesophageal reflux disease)    tx. pantoprazole   History of hiatal hernia    Hypertension    Multiple thyroid nodules    Myocardial infarction (HCC)    Pneumonia    PONV (postoperative nausea and vomiting)    Sleep apnea    No cpap now -3 months last due to insurance reasons   Past Surgical History:  Procedure Laterality Date   ABDOMINAL HYSTERECTOMY     partial hyst   BREAST BIOPSY     several- all benign   CARDIAC CATHETERIZATION     10'11   CERVICAL DISC SURGERY     1977   FOOT SURGERY     Bilateral   KNEE ARTHROSCOPY     right '04   TOTAL HIP ARTHROPLASTY Right 05/29/2023   Procedure: RIGHT TOTAL HIP ARTHROPLASTY ANTERIOR APPROACH;  Surgeon: Ollen Gross, MD;  Location: [facility];  Service: Orthopedics;  Laterality: Right;   TOTAL KNEE ARTHROPLASTY  06/04/2011   Procedure: TOTAL KNEE ARTHROPLASTY;  Surgeon: Loanne Drilling;  Location: [facility];  Service: Orthopedics;  Laterality: Right;   TOTAL KNEE ARTHROPLASTY Left 08/08/2015   Procedure: TOTAL LEFT KNEE ARTHROPLASTY;  Surgeon: Ollen Gross, MD;  Location: [facility];  Service: Orthopedics;   Laterality: Left;   Patient Active Problem List   Diagnosis Date Noted   OA (osteoarthritis) of hip 05/29/2023   Osteoarthritis of right hip 05/29/2023   Chest pain 02/10/2022   Asthma, chronic 02/10/2022   Hyperlipidemia 02/10/2022   Essential hypertension 02/10/2022   UTI (urinary tract infection) 02/10/2022   OA (osteoarthritis) of knee 08/08/2015   Osteoarthritis of right knee 06/04/2011    PCP: Gardiner Fanti  REFERRING PROVIDER: Eartha Inch  REFERRING DIAG: Z51.89: Encounter for other specified aftercare  R THA anterior approach  THERAPY DIAG:  Status post total replacement of right hip  Stiffness of right hip, not elsewhere classified  Pain in right hip  Muscle weakness (generalized)  Other abnormalities of gait and mobility  Rationale for Evaluation and Treatment: Rehabilitation  ONSET DATE: 05/29/23  SUBJECTIVE:   SUBJECTIVE STATEMENT: I am driving today and I went out shopping a little yesterday.  I was very tired and a little sore, she come in today without a cane  PERTINENT HISTORY: HPI: Dawn Collins, 77 y.o. female, has a history of pain and functional disability in the right hip due to arthritis and patient has failed non-surgical conservative treatments for greater  than 12 weeks to include use of assistive devices and activity modification. Onset of symptoms was gradual, starting 4 years ago with gradually worsening course since that time. The patient noted no past surgery on the right hip. Patient currently rates pain in the right hip at 8 out of 10 with activity. Patient has worsening of pain with activity and weight bearing, trendelenberg gait, and pain that interfers with activities of daily living. Patient has evidence of periarticular osteophytes and joint space narrowing by imaging studies. This condition presents safety issues increasing the risk of falls. There is no current active infection.  THA took place 05/29/23   PAIN:  Are you having  pain? Yes: NPRS scale: 3/10 Pain location: R hip and down my thigh Pain description: feels numb, sore  Aggravating factors: lifting my leg up kind of hurts, walking like it hurts  Relieving factors: Tylenol, ice sometimes   PRECAUTIONS: Anterior hip - Do not step backwards with surgical leg. No hip extension. - Do not allow surgical leg to externally rotate (turn outwards). - Do not cross your legs. Use a pillow between legs when rolling. - Sleep on your surgical side when side lying. - No bending past 90d  RED FLAGS: None   WEIGHT BEARING RESTRICTIONS: No  FALLS:  Has patient fallen in last 6 months? No  LIVING ENVIRONMENT: Lives with: lives alone Lives in: House/apartment Stairs: No Has following equipment at home: Single point cane and Wheelchair (manual)  OCCUPATION: Retired from doing hair for 43 years   PLOF: Independent and Independent with basic ADLs  PATIENT GOALS: be able to walk straight again, and get my posture upright   NEXT MD VISIT: 07/09/23  OBJECTIVE:  Note: Objective measures were completed at Evaluation unless otherwise noted.  DIAGNOSTIC FINDINGS: INDINGS: FINDINGS: Right hip arthroplasty in expected alignment. No periprosthetic lucency or fracture. Recent postsurgical change includes air and edema in the soft tissues.   IMPRESSION: Right hip arthroplasty without immediate postoperative complication.  Two fluoroscopic spot views of the pelvis and right hip obtained in the operating room. Images during hip arthroplasty. Fluoroscopy time 7 seconds. Dose 1 mGy.   IMPRESSION: Intraoperative fluoroscopy during right hip arthroplasty.   COGNITION: Overall cognitive status: Within functional limits for tasks assessed     SENSATION: WFL  POSTURE: rounded shoulders, forward head, and flexed trunk   PALPATION: Some TTP and soreness in R hip   LOWER EXTREMITY ROM:  Active ROM Right eval Left eval  Hip flexion Pain >90d    Hip extension     Hip abduction 30   Hip adduction    Hip internal rotation 25   Hip external rotation 30   Knee flexion WFL   Knee extension WFL   Ankle dorsiflexion    Ankle plantarflexion    Ankle inversion    Ankle eversion     (Blank rows = not tested)  LOWER EXTREMITY MMT:  MMT Right eval Left eval  Hip flexion 2+ with pain 4-  Hip extension    Hip abduction 4- 4  Hip adduction 4 4  Hip internal rotation 4-   Hip external rotation 2+ with pain   Knee flexion    Knee extension    Ankle dorsiflexion    Ankle plantarflexion    Ankle inversion    Ankle eversion     (Blank rows = not tested)  FUNCTIONAL TESTS:  5 times sit to stand: 20.55s Timed up and go (TUG): 19.50s  GAIT: Distance walked:  in clinic distances Assistive device utilized: Single point cane and Wheelchair (manual) Level of assistance: Modified independence Comments: antalgic gait, decrease stance time and step length with RLE, decreased hip flexion on RLE   TODAY'S TREATMENT:                                                                                                                              DATE:  07/12/23 Nustep level 5 x 6 minutes 20# leg curls 2x10 Tried leg extension with 5# painful in the knees 2.5# LAQ 2.5# marches 2# standing hip abduction Seated green tband clamshells Standing no weight extensions Sit to stand with arm rest x 10 total Feet on ball K2C, rotation STM gentle to the right thigh, ITB and scar, very tender  07/08/23 NuStep L4 x74mins Hip Add ball squeeze Hip Abd green 2x10 S2S x15 some with UE HS curls 20lb 2x10 Leg Ext 5lb 2x10 Standing marches 2x10 MHP to lumbar spine x10 min   07/03/23 NuStep L4 x13mins STS x10, then with OHP red ball x10 LAQ 3# 2x10 HS curls green 2x10 Side steps against mat Standing marching 20 reps alt  Standing hip abd x10 each side  MHP to lumbar spine x10 min while LE stretching   07/01/23 NuStep L5 x 5 min Hip add ball squeeze  Hip abd  green 2x10 Seated march 2lb 2x5 LAQ 2lb 2x10   MHP to lumbar spine x10 min   EVAL 06/26/23   PATIENT EDUCATION:  Education details: POC and HEP  Person educated: Patient Education method: Explanation Education comprehension: verbalized understanding  HOME EXERCISE PROGRAM: Access Code: [email or web address] URL: [email or web address] Date: 06/26/2023 Prepared by: Cassie Freer  Exercises - Standing Hip Abduction with Counter Support  - 1 x daily - 7 x weekly - 2 sets - 10 reps - Standing March with Counter Support  - 1 x daily - 7 x weekly - 2 sets - 10 reps - Sit to Stand  - 1 x daily - 7 x weekly - 2 sets - 10 reps - Seated Hip Abduction with Resistance  - 1 x daily - 7 x weekly - 2 sets - 10 reps - Seated Hip Adduction Isometrics with Ball  - 1 x daily - 7 x weekly - 2 sets - 10 reps  ASSESSMENT:  CLINICAL IMPRESSION: Patient is a 77 y.o. female who was seen today for physical therapy treatment for R THA on 05/29/23. She enters ambulating without device today, she is driving today and reports that she did some shopping yesterday and is very sore.  Continued with LE strengthening interventions and some stretching. She has some back pain with exercises. She is very tender in the right quad, ITB and scar Decrease activity tolerance with functional interventions. Patient will benefit from skilled PT to address her R hip impairments to be able to return to PLOF and do her daily activities without pain.   OBJECTIVE IMPAIRMENTS: Abnormal gait, decreased mobility,  difficulty walking, decreased ROM, decreased strength, and pain.   ACTIVITY LIMITATIONS: lifting, bending, sitting, standing, squatting, stairs, transfers, and locomotion level  PARTICIPATION LIMITATIONS: cleaning, laundry, driving, shopping, community activity, and yard work  [facility] POTENTIAL: Good  CLINICAL DECISION MAKING: Stable/uncomplicated  EVALUATION COMPLEXITY: Low   GOALS: Goals reviewed with patient?  Yes  SHORT TERM GOALS: Target date: 08/07/23  Patient will be independent with initial HEP. Baseline: given 06/26/23 Goal status: met 07/12/23  2.  Patient will complete TUG < 14s without AD Baseline: 19.50s Goal status:progressing 07/12/23   LONG TERM GOALS: Target date: 09/18/23  Patient will be independent with advanced/ongoing HEP to improve outcomes and carryover.  Goal status: INITIAL  2.  Patient will report at least 75% improvement in R hip pain to improve QOL. Baseline: 6/10 pain Goal status: INITIAL  3.  Patient will demonstrate improved functional LE strength as demonstrated by 4/5 or better in hip MMT. Baseline: see chart above Goal status: INITIAL  4.  Patient will be able to ambulate 600' with LRAD and normal gait pattern without increased pain to access community.  Baseline: using RW , antalgic pattern Goal status: INITIAL  5.  Patient will complete 5xSTS < 15s from chair height Baseline: 20.55s Goal status: INITIAL  PLAN:  PT FREQUENCY: 2x/week  PT DURATION: 12 weeks  PLANNED INTERVENTIONS: 97110-Therapeutic exercises, 97530- Therapeutic activity, 97112- Neuromuscular re-education, 97535- Self Care, 21308- Manual therapy, [phone]- Gait training, [phone]- Vasopneumatic device, Patient/Family education, Balance training, Stair training, Joint mobilization, Scar mobilization, Cryotherapy, and Moist heat  PLAN FOR NEXT SESSION: progress strength and function  Sirinity Outland W, PT 07/12/2023, 9:29 AM

## 2023-07-18 ENCOUNTER — Ambulatory Visit: Payer: Medicare Other

## 2023-07-23 ENCOUNTER — Ambulatory Visit: Payer: Medicare Other | Admitting: Physical Therapy

## 2023-07-23 ENCOUNTER — Encounter: Payer: Self-pay | Admitting: Physical Therapy

## 2023-07-23 DIAGNOSIS — M25651 Stiffness of right hip, not elsewhere classified: Secondary | ICD-10-CM

## 2023-07-23 DIAGNOSIS — M25551 Pain in right hip: Secondary | ICD-10-CM

## 2023-07-23 DIAGNOSIS — R2689 Other abnormalities of gait and mobility: Secondary | ICD-10-CM

## 2023-07-23 DIAGNOSIS — Z96641 Presence of right artificial hip joint: Secondary | ICD-10-CM | POA: Diagnosis not present

## 2023-07-23 DIAGNOSIS — M6281 Muscle weakness (generalized): Secondary | ICD-10-CM

## 2023-07-23 NOTE — Therapy (Signed)
 OUTPATIENT PHYSICAL THERAPY LOWER EXTREMITY    Patient Name: Dawn Collins MRN: [id number] DOB:[birth year], 77 y.o., female Today's Date: 07/23/2023  END OF SESSION:  PT End of Session - 07/23/23 1610     Visit Number 6    Date for PT Re-Evaluation 09/18/23    Authorization Type Medicare    PT Start Time 1605    PT Stop Time 1650    PT Time Calculation (min) 45 min    Activity Tolerance Patient limited by pain;Patient tolerated treatment well    Behavior During Therapy [facility] for tasks assessed/performed              Past Medical History:  Diagnosis Date   Arthritis    knees, back.   Asthma    2 weeks cold,no problems now-well controlled   Bursitis, knee    Bil. Hips, not knee   Cancer (HCC)    Complication of anesthesia    slow to wake up   Diverticulitis of colon 05/28/2011   no current problems   GERD (gastroesophageal reflux disease)    tx. pantoprazole    History of hiatal hernia    Hypertension    Multiple thyroid nodules    Myocardial infarction (HCC)    Pneumonia    PONV (postoperative nausea and vomiting)    Sleep apnea    No cpap now -3 months last due to insurance reasons   Past Surgical History:  Procedure Laterality Date   ABDOMINAL HYSTERECTOMY     partial hyst   BREAST BIOPSY     several- all benign   CARDIAC CATHETERIZATION     10'11   CERVICAL DISC SURGERY     1977   FOOT SURGERY     Bilateral   KNEE ARTHROSCOPY     right '04   TOTAL HIP ARTHROPLASTY Right 05/29/2023   Procedure: RIGHT TOTAL HIP ARTHROPLASTY ANTERIOR APPROACH;  Surgeon: Melodi Lerner, MD;  Location: [facility];  Service: Orthopedics;  Laterality: Right;   TOTAL KNEE ARTHROPLASTY  06/04/2011   Procedure: TOTAL KNEE ARTHROPLASTY;  Surgeon: Lerner LULLA Melodi;  Location: [facility];  Service: Orthopedics;  Laterality: Right;   TOTAL KNEE ARTHROPLASTY Left 08/08/2015   Procedure: TOTAL LEFT KNEE ARTHROPLASTY;  Surgeon: Lerner Melodi, MD;  Location: [facility];  Service: Orthopedics;   Laterality: Left;   Patient Active Problem List   Diagnosis Date Noted   OA (osteoarthritis) of hip 05/29/2023   Osteoarthritis of right hip 05/29/2023   Chest pain 02/10/2022   Asthma, chronic 02/10/2022   Hyperlipidemia 02/10/2022   Essential hypertension 02/10/2022   UTI (urinary tract infection) 02/10/2022   OA (osteoarthritis) of knee 08/08/2015   Osteoarthritis of right knee 06/04/2011    PCP: Rosalva Catchings  REFERRING PROVIDER: Asberry Kobs  REFERRING DIAG: Z51.89: Encounter for other specified aftercare  R THA anterior approach  THERAPY DIAG:  Status post total replacement of right hip  Stiffness of right hip, not elsewhere classified  Pain in right hip  Muscle weakness (generalized)  Other abnormalities of gait and mobility  Rationale for Evaluation and Treatment: Rehabilitation  ONSET DATE: 05/29/23  SUBJECTIVE:   SUBJECTIVE STATEMENT: I am doing a little better, I am using my hand less to get the leg in and out of the car  PERTINENT HISTORY: HPI: Dawn Collins, 77 y.o. female, has a history of pain and functional disability in the right hip due to arthritis and patient has failed non-surgical conservative treatments for greater than 12 weeks to include use  of assistive devices and activity modification. Onset of symptoms was gradual, starting 4 years ago with gradually worsening course since that time. The patient noted no past surgery on the right hip. Patient currently rates pain in the right hip at 8 out of 10 with activity. Patient has worsening of pain with activity and weight bearing, trendelenberg gait, and pain that interfers with activities of daily living. Patient has evidence of periarticular osteophytes and joint space narrowing by imaging studies. This condition presents safety issues increasing the risk of falls. There is no current active infection.  THA took place 05/29/23   PAIN:  Are you having pain? Yes: NPRS scale: 3/10 Pain location:  R hip and down my thigh Pain description: feels numb, sore  Aggravating factors: lifting my leg up kind of hurts, walking like it hurts  Relieving factors: Tylenol , ice sometimes   PRECAUTIONS: Anterior hip - Do not step backwards with surgical leg. No hip extension. - Do not allow surgical leg to externally rotate (turn outwards). - Do not cross your legs. Use a pillow between legs when rolling. - Sleep on your surgical side when side lying. - No bending past 90d  RED FLAGS: None   WEIGHT BEARING RESTRICTIONS: No  FALLS:  Has patient fallen in last 6 months? No  LIVING ENVIRONMENT: Lives with: lives alone Lives in: House/apartment Stairs: No Has following equipment at home: Single point cane and Wheelchair (manual)  OCCUPATION: Retired from doing hair for 43 years   PLOF: Independent and Independent with basic ADLs  PATIENT GOALS: be able to walk straight again, and get my posture upright   NEXT MD VISIT: 07/09/23  OBJECTIVE:  Note: Objective measures were completed at Evaluation unless otherwise noted.  DIAGNOSTIC FINDINGS: INDINGS: FINDINGS: Right hip arthroplasty in expected alignment. No periprosthetic lucency or fracture. Recent postsurgical change includes air and edema in the soft tissues.   IMPRESSION: Right hip arthroplasty without immediate postoperative complication.  Two fluoroscopic spot views of the pelvis and right hip obtained in the operating room. Images during hip arthroplasty. Fluoroscopy time 7 seconds. Dose 1 mGy.   IMPRESSION: Intraoperative fluoroscopy during right hip arthroplasty.   COGNITION: Overall cognitive status: Within functional limits for tasks assessed     SENSATION: WFL  POSTURE: rounded shoulders, forward head, and flexed trunk   PALPATION: Some TTP and soreness in R hip   LOWER EXTREMITY ROM:  Active ROM Right eval Left eval  Hip flexion Pain >90d    Hip extension    Hip abduction 30   Hip adduction    Hip  internal rotation 25   Hip external rotation 30   Knee flexion WFL   Knee extension WFL   Ankle dorsiflexion    Ankle plantarflexion    Ankle inversion    Ankle eversion     (Blank rows = not tested)  LOWER EXTREMITY MMT:  MMT Right eval Left eval  Hip flexion 2+ with pain 4-  Hip extension    Hip abduction 4- 4  Hip adduction 4 4  Hip internal rotation 4-   Hip external rotation 2+ with pain   Knee flexion    Knee extension    Ankle dorsiflexion    Ankle plantarflexion    Ankle inversion    Ankle eversion     (Blank rows = not tested)  FUNCTIONAL TESTS:  5 times sit to stand: 20.55s Timed up and go (TUG): 19.50s  GAIT: Distance walked: in clinic distances Assistive device utilized:  Single point cane and Wheelchair (manual) Level of assistance: Modified independence Comments: antalgic gait, decrease stance time and step length with RLE, decreased hip flexion on RLE   TODAY'S TREATMENT:                                                                                                                              DATE:  07/23/23 Nustep level 5 x 6 minutes Standing marching Standing hip abduction, some right hip pain Seated red tband clamshells Ball b/n knees squeeze Feet on ball K2C, rotation, small bridge, isometric abs Passive HS stretches STM tot he right ITB and HS very tender   07/12/23 Nustep level 5 x 6 minutes 20# leg curls 2x10 Tried leg extension with 5# painful in the knees 2.5# LAQ 2.5# marches 2# standing hip abduction Seated green tband clamshells Standing no weight extensions Sit to stand with arm rest x 10 total Feet on ball K2C, rotation STM gentle to the right thigh, ITB and scar, very tender  07/08/23 NuStep L4 x31mins Hip Add ball squeeze Hip Abd green 2x10 S2S x15 some with UE HS curls 20lb 2x10 Leg Ext 5lb 2x10 Standing marches 2x10 MHP to lumbar spine x10 min   07/03/23 NuStep L4 x3mins STS x10, then with OHP red ball  x10 LAQ 3# 2x10 HS curls green 2x10 Side steps against mat Standing marching 20 reps alt  Standing hip abd x10 each side  MHP to lumbar spine x10 min while LE stretching   07/01/23 NuStep L5 x 5 min Hip add ball squeeze  Hip abd green 2x10 Seated march 2lb 2x5 LAQ 2lb 2x10   MHP to lumbar spine x10 min   EVAL 06/26/23   PATIENT EDUCATION:  Education details: POC and HEP  Person educated: Patient Education method: Explanation Education comprehension: verbalized understanding  HOME EXERCISE PROGRAM: Access Code: [email or web address] URL: [email or web address] Date: 06/26/2023 Prepared by: Almetta Fam  Exercises - Standing Hip Abduction with Counter Support  - 1 x daily - 7 x weekly - 2 sets - 10 reps - Standing March with Counter Support  - 1 x daily - 7 x weekly - 2 sets - 10 reps - Sit to Stand  - 1 x daily - 7 x weekly - 2 sets - 10 reps - Seated Hip Abduction with Resistance  - 1 x daily - 7 x weekly - 2 sets - 10 reps - Seated Hip Adduction Isometrics with Ball  - 1 x daily - 7 x weekly - 2 sets - 10 reps  ASSESSMENT:  CLINICAL IMPRESSION: Patient is a 77 y.o. female who was seen today for physical therapy treatment for R THA on 05/29/23. She does report that she is doing better getting the leg in and out of the car without using her hands.    Continued with LE strengthening interventions and some stretching.  She is very tender in the right quad, ITB and scar Decrease activity tolerance with  functional interventions. Patient will benefit from skilled PT to address her R hip impairments to be able to return to PLOF and do her daily activities without pain.   OBJECTIVE IMPAIRMENTS: Abnormal gait, decreased mobility, difficulty walking, decreased ROM, decreased strength, and pain.   ACTIVITY LIMITATIONS: lifting, bending, sitting, standing, squatting, stairs, transfers, and locomotion level  PARTICIPATION LIMITATIONS: cleaning, laundry, driving, shopping, community  activity, and yard work  [employer] POTENTIAL: Good  CLINICAL DECISION MAKING: Stable/uncomplicated  EVALUATION COMPLEXITY: Low   GOALS: Goals reviewed with patient? Yes  SHORT TERM GOALS: Target date: 08/07/23  Patient will be independent with initial HEP. Baseline: given 06/26/23 Goal status: met 07/12/23  2.  Patient will complete TUG < 14s without AD Baseline: 19.50s Goal status:progressing 07/12/23   LONG TERM GOALS: Target date: 09/18/23  Patient will be independent with advanced/ongoing HEP to improve outcomes and carryover.  Goal status: INITIAL  2.  Patient will report at least 75% improvement in R hip pain to improve QOL. Baseline: 6/10 pain Goal status: INITIAL  3.  Patient will demonstrate improved functional LE strength as demonstrated by 4/5 or better in hip MMT. Baseline: see chart above Goal status: progressing 07/23/23  4.  Patient will be able to ambulate 600' with LRAD and normal gait pattern without increased pain to access community.  Baseline: using RW , antalgic pattern Goal status: progressing 07/23/23  5.  Patient will complete 5xSTS < 15s from chair height Baseline: 20.55s Goal status: INITIAL  PLAN:  PT FREQUENCY: 2x/week  PT DURATION: 12 weeks  PLANNED INTERVENTIONS: 97110-Therapeutic exercises, 97530- Therapeutic activity, 97112- Neuromuscular re-education, 97535- Self Care, [id number]- Manual therapy, [phone]- Gait training, [phone]- Vasopneumatic device, Patient/Family education, Balance training, Stair training, Joint mobilization, Scar mobilization, Cryotherapy, and Moist heat  PLAN FOR NEXT SESSION: progress strength and function  Destinie Thornsberry W, PT 07/23/2023, 4:11 PM

## 2023-07-30 ENCOUNTER — Encounter: Payer: Self-pay | Admitting: Physical Therapy

## 2023-07-30 ENCOUNTER — Ambulatory Visit: Payer: Medicare Other | Attending: Physician Assistant | Admitting: Physical Therapy

## 2023-07-30 DIAGNOSIS — M25551 Pain in right hip: Secondary | ICD-10-CM | POA: Diagnosis present

## 2023-07-30 DIAGNOSIS — M6281 Muscle weakness (generalized): Secondary | ICD-10-CM

## 2023-07-30 DIAGNOSIS — Z96641 Presence of right artificial hip joint: Secondary | ICD-10-CM | POA: Diagnosis present

## 2023-07-30 DIAGNOSIS — M25651 Stiffness of right hip, not elsewhere classified: Secondary | ICD-10-CM

## 2023-07-30 NOTE — Therapy (Addendum)
 OUTPATIENT PHYSICAL THERAPY LOWER EXTREMITY    Patient Name: Dawn Collins MRN: [id number] DOB:1946/07/20, 78 y.o., female Today's Date: 07/30/2023  END OF SESSION:  PT End of Session - 07/30/23 1514     Visit Number 7    Date for PT Re-Evaluation 09/18/23    PT Start Time 1515    PT Stop Time 1605    PT Time Calculation (min) 50 min    Activity Tolerance Patient tolerated treatment well    Behavior During Therapy [facility] for tasks assessed/performed              Past Medical History:  Diagnosis Date   Arthritis    knees, back.   Asthma    2 weeks cold,no problems now-well controlled   Bursitis, knee    Bil. Hips, not knee   Cancer (HCC)    Complication of anesthesia    slow to wake up   Diverticulitis of colon 05/28/2011   no current problems   GERD (gastroesophageal reflux disease)    tx. pantoprazole    History of hiatal hernia    Hypertension    Multiple thyroid nodules    Myocardial infarction (HCC)    Pneumonia    PONV (postoperative nausea and vomiting)    Sleep apnea    No cpap now -3 months last due to insurance reasons   Past Surgical History:  Procedure Laterality Date   ABDOMINAL HYSTERECTOMY     partial hyst   BREAST BIOPSY     several- all benign   CARDIAC CATHETERIZATION     10'11   CERVICAL DISC SURGERY     1977   FOOT SURGERY     Bilateral   KNEE ARTHROSCOPY     right '04   TOTAL HIP ARTHROPLASTY Right 05/29/2023   Procedure: RIGHT TOTAL HIP ARTHROPLASTY ANTERIOR APPROACH;  Surgeon: Melodi Lerner, MD;  Location: [facility];  Service: Orthopedics;  Laterality: Right;   TOTAL KNEE ARTHROPLASTY  06/04/2011   Procedure: TOTAL KNEE ARTHROPLASTY;  Surgeon: Lerner LULLA Melodi;  Location: [facility];  Service: Orthopedics;  Laterality: Right;   TOTAL KNEE ARTHROPLASTY Left 08/08/2015   Procedure: TOTAL LEFT KNEE ARTHROPLASTY;  Surgeon: Lerner Melodi, MD;  Location: [facility];  Service: Orthopedics;  Laterality: Left;   Patient Active Problem List    Diagnosis Date Noted   OA (osteoarthritis) of hip 05/29/2023   Osteoarthritis of right hip 05/29/2023   Chest pain 02/10/2022   Asthma, chronic 02/10/2022   Hyperlipidemia 02/10/2022   Essential hypertension 02/10/2022   UTI (urinary tract infection) 02/10/2022   OA (osteoarthritis) of knee 08/08/2015   Osteoarthritis of right knee 06/04/2011    PCP: Rosalva Catchings  REFERRING PROVIDER: Asberry Kobs  REFERRING DIAG: Z51.89: Encounter for other specified aftercare  R THA anterior approach  THERAPY DIAG:  Status post total replacement of right hip  Stiffness of right hip, not elsewhere classified  Pain in right hip  Muscle weakness (generalized)  Rationale for Evaluation and Treatment: Rehabilitation  ONSET DATE: 05/29/23  SUBJECTIVE:   SUBJECTIVE STATEMENT: Im ok  PERTINENT HISTORY: HPI: [name], 78 y.o. female, has a history of pain and functional disability in the right hip due to arthritis and patient has failed non-surgical conservative treatments for greater than 12 weeks to include use of assistive devices and activity modification. Onset of symptoms was gradual, starting 4 years ago with gradually worsening course since that time. The patient noted no past surgery on the right hip. Patient currently rates pain  in the right hip at 8 out of 10 with activity. Patient has worsening of pain with activity and weight bearing, trendelenberg gait, and pain that interfers with activities of daily living. Patient has evidence of periarticular osteophytes and joint space narrowing by imaging studies. This condition presents safety issues increasing the risk of falls. There is no current active infection.  THA took place 05/29/23   PAIN:  Are you having pain? Yes: NPRS scale: 3/10 Pain location: R hip and down my thigh Pain description: feels numb, sore  Aggravating factors: lifting my leg up kind of hurts, walking like it hurts  Relieving factors: Tylenol , ice  sometimes   PRECAUTIONS: Anterior hip - Do not step backwards with surgical leg. No hip extension. - Do not allow surgical leg to externally rotate (turn outwards). - Do not cross your legs. Use a pillow between legs when rolling. - Sleep on your surgical side when side lying. - No bending past 90d  RED FLAGS: None   WEIGHT BEARING RESTRICTIONS: No  FALLS:  Has patient fallen in last 6 months? No  LIVING ENVIRONMENT: Lives with: lives alone Lives in: House/apartment Stairs: No Has following equipment at home: Single point cane and Wheelchair (manual)  OCCUPATION: Retired from doing hair for 43 years   PLOF: Independent and Independent with basic ADLs  PATIENT GOALS: be able to walk straight again, and get my posture upright   NEXT MD VISIT: 07/09/23  OBJECTIVE:  Note: Objective measures were completed at Evaluation unless otherwise noted.  DIAGNOSTIC FINDINGS: INDINGS: FINDINGS: Right hip arthroplasty in expected alignment. No periprosthetic lucency or fracture. Recent postsurgical change includes air and edema in the soft tissues.   IMPRESSION: Right hip arthroplasty without immediate postoperative complication.  Two fluoroscopic spot views of the pelvis and right hip obtained in the operating room. Images during hip arthroplasty. Fluoroscopy time 7 seconds. Dose 1 mGy.   IMPRESSION: Intraoperative fluoroscopy during right hip arthroplasty.   COGNITION: Overall cognitive status: Within functional limits for tasks assessed     SENSATION: WFL  POSTURE: rounded shoulders, forward head, and flexed trunk   PALPATION: Some TTP and soreness in R hip   LOWER EXTREMITY ROM:  Active ROM Right eval Left eval  Hip flexion Pain >90d    Hip extension    Hip abduction 30   Hip adduction    Hip internal rotation 25   Hip external rotation 30   Knee flexion WFL   Knee extension WFL   Ankle dorsiflexion    Ankle plantarflexion    Ankle inversion    Ankle  eversion     (Blank rows = not tested)  LOWER EXTREMITY MMT:  MMT Right eval Left eval  Hip flexion 2+ with pain 4-  Hip extension    Hip abduction 4- 4  Hip adduction 4 4  Hip internal rotation 4-   Hip external rotation 2+ with pain   Knee flexion    Knee extension    Ankle dorsiflexion    Ankle plantarflexion    Ankle inversion    Ankle eversion     (Blank rows = not tested)  FUNCTIONAL TESTS:  5 times sit to stand: 20.55s Timed up and go (TUG): 19.50s  GAIT: Distance walked: in clinic distances Assistive device utilized: Single point cane and Wheelchair (manual) Level of assistance: Modified independence Comments: antalgic gait, decrease stance time and step length with RLE, decreased hip flexion on RLE   TODAY'S TREATMENT:  DATE:  07/30/23 Nustep level 5 x 6 minutes 6in step ups x5 each Resisted sides steps 20lb x3 each Seated red tband clamshells MHP to lumbar spine x10 min    07/23/23 Nustep level 5 x 6 minutes Standing marching Standing hip abduction, some right hip pain Seated red tband clamshells Ball b/n knees squeeze Feet on ball K2C, rotation, small bridge, isometric abs Passive HS stretches STM tot he right ITB and HS very tender   07/12/23 Nustep level 5 x 6 minutes 20# leg curls 2x10 Tried leg extension with 5# painful in the knees 2.5# LAQ 2.5# marches 2# standing hip abduction Seated green tband clamshells Standing no weight extensions Sit to stand with arm rest x 10 total Feet on ball K2C, rotation STM gentle to the right thigh, ITB and scar, very tender  07/08/23 NuStep L4 x42mins Hip Add ball squeeze Hip Abd green 2x10 S2S x15 some with UE HS curls 20lb 2x10 Leg Ext 5lb 2x10 Standing marches 2x10 MHP to lumbar spine x10 min   07/03/23 NuStep L4 x69mins STS x10, then with OHP red ball x10 LAQ 3#  2x10 HS curls green 2x10 Side steps against mat Standing marching 20 reps alt  Standing hip abd x10 each side  MHP to lumbar spine x10 min while LE stretching   07/01/23 NuStep L5 x 5 min Hip add ball squeeze  Hip abd green 2x10 Seated march 2lb 2x5 LAQ 2lb 2x10   MHP to lumbar spine x10 min   EVAL 06/26/23   PATIENT EDUCATION:  Education details: POC and HEP  Person educated: Patient Education method: Explanation Education comprehension: verbalized understanding  HOME EXERCISE PROGRAM: Access Code: [email or web address] URL: [email or web address] Date: 06/26/2023 Prepared by: Almetta Fam  Exercises - Standing Hip Abduction with Counter Support  - 1 x daily - 7 x weekly - 2 sets - 10 reps - Standing March with Counter Support  - 1 x daily - 7 x weekly - 2 sets - 10 reps - Sit to Stand  - 1 x daily - 7 x weekly - 2 sets - 10 reps - Seated Hip Abduction with Resistance  - 1 x daily - 7 x weekly - 2 sets - 10 reps - Seated Hip Adduction Isometrics with Ball  - 1 x daily - 7 x weekly - 2 sets - 10 reps  ASSESSMENT:  CLINICAL IMPRESSION: Patient is a 78 y.o. female who was seen today for physical therapy treatment for R THA on 05/29/23. She continues to report improvement overall.  Session focused on more functional LE strength which was very taxing on Pt. Weakness exposed with both resisted side steps and step ups.  She is very tender in the right quad, ITB and scar. Decrease activity tolerance with functional interventions requiring extended rest during session. Patient will benefit from skilled PT to address her R hip impairments to be able to return to PLOF and do her daily activities without pain.   OBJECTIVE IMPAIRMENTS: Abnormal gait, decreased mobility, difficulty walking, decreased ROM, decreased strength, and pain.   ACTIVITY LIMITATIONS: lifting, bending, sitting, standing, squatting, stairs, transfers, and locomotion level  PARTICIPATION LIMITATIONS: cleaning,  laundry, driving, shopping, community activity, and yard work  [employer] POTENTIAL: Good  CLINICAL DECISION MAKING: Stable/uncomplicated  EVALUATION COMPLEXITY: Low   GOALS: Goals reviewed with patient? Yes  SHORT TERM GOALS: Target date: 08/07/23  Patient will be independent with initial HEP. Baseline: given 06/26/23 Goal status: met 07/12/23  2.  Patient will complete TUG <  14s without AD Baseline: 19.50s Goal status:progressing 07/12/23   LONG TERM GOALS: Target date: 09/18/23  Patient will be independent with advanced/ongoing HEP to improve outcomes and carryover.  Goal status: INITIAL  2.  Patient will report at least 75% improvement in R hip pain to improve QOL. Baseline: 6/10 pain Goal status: INITIAL  3.  Patient will demonstrate improved functional LE strength as demonstrated by 4/5 or better in hip MMT. Baseline: see chart above Goal status: progressing 07/23/23  4.  Patient will be able to ambulate 600' with LRAD and normal gait pattern without increased pain to access community.  Baseline: using RW , antalgic pattern Goal status: progressing 07/23/23  5.  Patient will complete 5xSTS < 15s from chair height Baseline: 20.55s Goal status: INITIAL  PLAN:  PT FREQUENCY: 2x/week  PT DURATION: 12 weeks  PLANNED INTERVENTIONS: 97110-Therapeutic exercises, 97530- Therapeutic activity, 97112- Neuromuscular re-education, 97535- Self Care, 02859- Manual therapy, [phone]- Gait training, [phone]- Vasopneumatic device, Patient/Family education, Balance training, Stair training, Joint mobilization, Scar mobilization, Cryotherapy, and Moist heat  PLAN FOR NEXT SESSION: progress strength and function  Tanda KANDICE Sorrow, PTA 07/30/2023, 3:59 PM

## 2023-08-05 ENCOUNTER — Ambulatory Visit: Payer: Medicare Other | Admitting: Physical Therapy

## 2023-08-07 ENCOUNTER — Ambulatory Visit: Payer: Medicare Other | Admitting: Physical Therapy

## 2023-08-07 ENCOUNTER — Encounter: Payer: Self-pay | Admitting: Physical Therapy

## 2023-08-07 DIAGNOSIS — M6281 Muscle weakness (generalized): Secondary | ICD-10-CM

## 2023-08-07 DIAGNOSIS — M25551 Pain in right hip: Secondary | ICD-10-CM

## 2023-08-07 DIAGNOSIS — Z96641 Presence of right artificial hip joint: Secondary | ICD-10-CM | POA: Diagnosis not present

## 2023-08-07 DIAGNOSIS — M25651 Stiffness of right hip, not elsewhere classified: Secondary | ICD-10-CM

## 2023-08-07 NOTE — Therapy (Signed)
 OUTPATIENT PHYSICAL THERAPY LOWER EXTREMITY    Patient Name: Dawn Collins MRN: [id number] DOB:07/12/1946, 78 y.o., female Today's Date: 08/07/2023  END OF SESSION:  PT End of Session - 08/07/23 1559     Visit Number 8    Date for PT Re-Evaluation 09/18/23    PT Start Time 1600    PT Stop Time 1645    PT Time Calculation (min) 45 min    Activity Tolerance Patient tolerated treatment well    Behavior During Therapy [facility] for tasks assessed/performed              Past Medical History:  Diagnosis Date   Arthritis    knees, back.   Asthma    2 weeks cold,no problems now-well controlled   Bursitis, knee    Bil. Hips, not knee   Cancer (HCC)    Complication of anesthesia    slow to wake up   Diverticulitis of colon 05/28/2011   no current problems   GERD (gastroesophageal reflux disease)    tx. pantoprazole    History of hiatal hernia    Hypertension    Multiple thyroid nodules    Myocardial infarction (HCC)    Pneumonia    PONV (postoperative nausea and vomiting)    Sleep apnea    No cpap now -3 months last due to insurance reasons   Past Surgical History:  Procedure Laterality Date   ABDOMINAL HYSTERECTOMY     partial hyst   BREAST BIOPSY     several- all benign   CARDIAC CATHETERIZATION     10'11   CERVICAL DISC SURGERY     1977   FOOT SURGERY     Bilateral   KNEE ARTHROSCOPY     right '04   TOTAL HIP ARTHROPLASTY Right 05/29/2023   Procedure: RIGHT TOTAL HIP ARTHROPLASTY ANTERIOR APPROACH;  Surgeon: Liliane Rei, MD;  Location: [facility];  Service: Orthopedics;  Laterality: Right;   TOTAL KNEE ARTHROPLASTY  06/04/2011   Procedure: TOTAL KNEE ARTHROPLASTY;  Surgeon: Aurther Blue;  Location: [facility];  Service: Orthopedics;  Laterality: Right;   TOTAL KNEE ARTHROPLASTY Left 08/08/2015   Procedure: TOTAL LEFT KNEE ARTHROPLASTY;  Surgeon: Liliane Rei, MD;  Location: [facility];  Service: Orthopedics;  Laterality: Left;   Patient Active Problem List    Diagnosis Date Noted   OA (osteoarthritis) of hip 05/29/2023   Osteoarthritis of right hip 05/29/2023   Chest pain 02/10/2022   Asthma, chronic 02/10/2022   Hyperlipidemia 02/10/2022   Essential hypertension 02/10/2022   UTI (urinary tract infection) 02/10/2022   OA (osteoarthritis) of knee 08/08/2015   Osteoarthritis of right knee 06/04/2011    PCP: Addie Holstein  REFERRING PROVIDER: Perla Bradford  REFERRING DIAG: Z51.89: Encounter for other specified aftercare  R THA anterior approach  THERAPY DIAG:  Status post total replacement of right hip  Stiffness of right hip, not elsewhere classified  Muscle weakness (generalized)  Pain in right hip  Rationale for Evaluation and Treatment: Rehabilitation  ONSET DATE: 05/29/23  SUBJECTIVE:   SUBJECTIVE STATEMENT: "Today might be my last day, I go to the doctor Tuesday"  PERTINENT HISTORY: HPI: Dawn Collins, 78 y.o. female, has a history of pain and functional disability in the right hip due to arthritis and patient has failed non-surgical conservative treatments for greater than 12 weeks to include use of assistive devices and activity modification. Onset of symptoms was gradual, starting 4 years ago with gradually worsening course since that time. The patient noted no  past surgery on the right hip. Patient currently rates pain in the right hip at 8 out of 10 with activity. Patient has worsening of pain with activity and weight bearing, trendelenberg gait, and pain that interfers with activities of daily living. Patient has evidence of periarticular osteophytes and joint space narrowing by imaging studies. This condition presents safety issues increasing the risk of falls. There is no current active infection.  THA took place 05/29/23   PAIN:  Are you having pain? Yes: NPRS scale: 2/10 Pain location: R hip and down my thigh Pain description: feels numb, sore  Aggravating factors: lifting my leg up kind of hurts, walking like  it hurts  Relieving factors: Tylenol , ice sometimes   PRECAUTIONS: Anterior hip - Do not step backwards with surgical leg. No hip extension. - Do not allow surgical leg to externally rotate (turn outwards). - Do not cross your legs. Use a pillow between legs when rolling. - Sleep on your surgical side when side lying. - No bending past 90d  RED FLAGS: None   WEIGHT BEARING RESTRICTIONS: No  FALLS:  Has patient fallen in last 6 months? No  LIVING ENVIRONMENT: Lives with: lives alone Lives in: House/apartment Stairs: No Has following equipment at home: Single point cane and Wheelchair (manual)  OCCUPATION: Retired from doing hair for 43 years   PLOF: Independent and Independent with basic ADLs  PATIENT GOALS: be able to walk straight again, and get my posture upright   NEXT MD VISIT: 07/09/23  OBJECTIVE:  Note: Objective measures were completed at Evaluation unless otherwise noted.  DIAGNOSTIC FINDINGS: INDINGS: FINDINGS: Right hip arthroplasty in expected alignment. No periprosthetic lucency or fracture. Recent postsurgical change includes air and edema in the soft tissues.   IMPRESSION: Right hip arthroplasty without immediate postoperative complication.  Two fluoroscopic spot views of the pelvis and right hip obtained in the operating room. Images during hip arthroplasty. Fluoroscopy time 7 seconds. Dose 1 mGy.   IMPRESSION: Intraoperative fluoroscopy during right hip arthroplasty.   COGNITION: Overall cognitive status: Within functional limits for tasks assessed     SENSATION: WFL  POSTURE: rounded shoulders, forward head, and flexed trunk   PALPATION: Some TTP and soreness in R hip   LOWER EXTREMITY ROM:  Active ROM Right eval Left eval  Hip flexion Pain >90d    Hip extension    Hip abduction 30   Hip adduction    Hip internal rotation 25   Hip external rotation 30   Knee flexion WFL   Knee extension [facility]   Ankle dorsiflexion    Ankle  plantarflexion    Ankle inversion    Ankle eversion     (Blank rows = not tested)  LOWER EXTREMITY MMT:  MMT Right eval Right  08/07/23  Hip flexion 2+ with pain 4- w/ pain  Hip extension    Hip abduction 4- 4+  Hip adduction 4 4+  Hip internal rotation 4-   Hip external rotation 2+ with pain   Knee flexion    Knee extension    Ankle dorsiflexion    Ankle plantarflexion    Ankle inversion    Ankle eversion     (Blank rows = not tested)  FUNCTIONAL TESTS:  5 times sit to stand: 20.55s Timed up and go (TUG): 19.50s  GAIT: Distance walked: in clinic distances Assistive device utilized: Single point cane and Wheelchair (manual) Level of assistance: Modified independence Comments: antalgic gait, decrease stance time and step length with RLE, decreased  hip flexion on RLE   TODAY'S TREATMENT:                                                                                                                              DATE:  08/07/23 Nustep level 5 x 6 minutes CHECKED GOALS  Seated green tband clamshells Hip Add ball squeeze  6in step ups x10 each Resisted gait forwards & backwards 20lb x3 each  Hs curls 20lb 2x10 Leg Ext 5lb 2x10  07/30/23 Nustep level 5 x 6 minutes 6in step ups x5 each Resisted sides steps 20lb x3 each Seated red tband clamshells MHP to lumbar spine x10 min   07/23/23 Nustep level 5 x 6 minutes Standing marching Standing hip abduction, some right hip pain Seated red tband clamshells Ball b/n knees squeeze Feet on ball K2C, rotation, small bridge, isometric abs Passive HS stretches STM tot he right ITB and HS very tender  07/12/23 Nustep level 5 x 6 minutes 20# leg curls 2x10 Tried leg extension with 5# painful in the knees 2.5# LAQ 2.5# marches 2# standing hip abduction Seated green tband clamshells Standing no weight extensions Sit to stand with arm rest x 10 total Feet on ball K2C, rotation STM gentle to the right thigh, ITB and scar,  very tender  07/08/23 NuStep L4 x87mins Hip Add ball squeeze Hip Abd green 2x10 S2S x15 some with UE HS curls 20lb 2x10 Leg Ext 5lb 2x10 Standing marches 2x10 MHP to lumbar spine x10 min    PATIENT EDUCATION:  Education details: POC and HEP  Person educated: Patient Education method: Explanation Education comprehension: verbalized understanding  HOME EXERCISE PROGRAM: Access Code: [email or web address] URL: [email or web address] Date: 06/26/2023 Prepared by: Donavon Fudge  Exercises - Standing Hip Abduction with Counter Support  - 1 x daily - 7 x weekly - 2 sets - 10 reps - Standing March with Counter Support  - 1 x daily - 7 x weekly - 2 sets - 10 reps - Sit to Stand  - 1 x daily - 7 x weekly - 2 sets - 10 reps - Seated Hip Abduction with Resistance  - 1 x daily - 7 x weekly - 2 sets - 10 reps - Seated Hip Adduction Isometrics with Ball  - 1 x daily - 7 x weekly - 2 sets - 10 reps  ASSESSMENT:  CLINICAL IMPRESSION: Patient is a 78 y.o. female who was seen today for physical therapy treatment for R THA on 05/29/23. She continues to report improvement overall. She has progressed increasing the strength in her R hip Pt has also progressed reporting decrease pain overall. Pt showed improved mobility with step ups. Cues for equal step length needed with resisted gait. Patient will benefit from skilled PT to address her R hip impairments to be able to return to PLOF and do her daily activities without pain.   OBJECTIVE IMPAIRMENTS: Abnormal gait, decreased mobility, difficulty walking, decreased ROM, decreased strength, and pain.  ACTIVITY LIMITATIONS: lifting, bending, sitting, standing, squatting, stairs, transfers, and locomotion level  PARTICIPATION LIMITATIONS: cleaning, laundry, driving, shopping, community activity, and yard work  [facility] POTENTIAL: Good  CLINICAL DECISION MAKING: Stable/uncomplicated  EVALUATION COMPLEXITY: Low   GOALS: Goals reviewed with patient?  Yes  SHORT TERM GOALS: Target date: 08/07/23  Patient will be independent with initial HEP. Baseline: given 06/26/23 Goal status: met 07/12/23  2.  Patient will complete TUG < 14s without AD Baseline: 19.50s Goal status:progressing 07/12/23   LONG TERM GOALS: Target date: 09/18/23  Patient will be independent with advanced/ongoing HEP to improve outcomes and carryover.  Goal status: INITIAL  2.  Patient will report at least 75% improvement in R hip pain to improve QOL. Baseline: 6/10 pain Goal status: Met 85% 08/07/23  3.  Patient will demonstrate improved functional LE strength as demonstrated by 4/5 or better in hip MMT. Baseline: see chart above Goal status: progressing 07/23/23  4.  Patient will be able to ambulate 600' with LRAD and normal gait pattern without increased pain to access community.  Baseline: using RW , antalgic pattern Goal status: progressing 07/23/23  5.  Patient will complete 5xSTS < 15s from chair height Baseline: 20.55s Goal status: Progressing 16.81 08/07/23  PLAN:  PT FREQUENCY: 2x/week  PT DURATION: 12 weeks  PLANNED INTERVENTIONS: 97110-Therapeutic exercises, 97530- Therapeutic activity, 97112- Neuromuscular re-education, 97535- Self Care, 96295- Manual therapy, [phone]- Gait training, [phone]- Vasopneumatic device, Patient/Family education, Balance training, Stair training, Joint mobilization, Scar mobilization, Cryotherapy, and Moist heat  PLAN FOR NEXT SESSION: progress strength and function, Pt goes to MD Tuesday.  Ollen Beverage, PTA 08/07/2023, 3:59 PM

## 2023-08-27 ENCOUNTER — Ambulatory Visit: Payer: Medicare Other | Attending: Student | Admitting: Physical Therapy

## 2023-08-27 ENCOUNTER — Encounter: Payer: Self-pay | Admitting: Physical Therapy

## 2023-08-27 DIAGNOSIS — M6281 Muscle weakness (generalized): Secondary | ICD-10-CM | POA: Diagnosis present

## 2023-08-27 DIAGNOSIS — M25651 Stiffness of right hip, not elsewhere classified: Secondary | ICD-10-CM | POA: Diagnosis present

## 2023-08-27 DIAGNOSIS — Z96641 Presence of right artificial hip joint: Secondary | ICD-10-CM

## 2023-08-27 DIAGNOSIS — M25551 Pain in right hip: Secondary | ICD-10-CM | POA: Diagnosis present

## 2023-08-27 DIAGNOSIS — R2689 Other abnormalities of gait and mobility: Secondary | ICD-10-CM | POA: Diagnosis present

## 2023-08-27 NOTE — Therapy (Signed)
 OUTPATIENT PHYSICAL THERAPY LOWER EXTREMITY    Patient Name: Dawn Collins MRN: [id number] DOB:[birth year], 78 y.o., female Today's Date: 08/27/2023  END OF SESSION:  PT End of Session - 08/27/23 1519     Visit Number 9    Date for PT Re-Evaluation 09/18/23    PT Start Time 1515    PT Stop Time 1600    PT Time Calculation (min) 45 min    Activity Tolerance Patient tolerated treatment well    Behavior During Therapy [facility] for tasks assessed/performed              Past Medical History:  Diagnosis Date   Arthritis    knees, back.   Asthma    2 weeks cold,no problems now-well controlled   Bursitis, knee    Bil. Hips, not knee   Cancer (HCC)    Complication of anesthesia    slow to wake up   Diverticulitis of colon 05/28/2011   no current problems   GERD (gastroesophageal reflux disease)    tx. pantoprazole    History of hiatal hernia    Hypertension    Multiple thyroid nodules    Myocardial infarction (HCC)    Pneumonia    PONV (postoperative nausea and vomiting)    Sleep apnea    No cpap now -3 months last due to insurance reasons   Past Surgical History:  Procedure Laterality Date   ABDOMINAL HYSTERECTOMY     partial hyst   BREAST BIOPSY     several- all benign   CARDIAC CATHETERIZATION     10'11   CERVICAL DISC SURGERY     1977   FOOT SURGERY     Bilateral   KNEE ARTHROSCOPY     right '04   TOTAL HIP ARTHROPLASTY Right 05/29/2023   Procedure: RIGHT TOTAL HIP ARTHROPLASTY ANTERIOR APPROACH;  Surgeon: Melodi Lerner, MD;  Location: [facility];  Service: Orthopedics;  Laterality: Right;   TOTAL KNEE ARTHROPLASTY  06/04/2011   Procedure: TOTAL KNEE ARTHROPLASTY;  Surgeon: Lerner LULLA Melodi;  Location: [facility];  Service: Orthopedics;  Laterality: Right;   TOTAL KNEE ARTHROPLASTY Left 08/08/2015   Procedure: TOTAL LEFT KNEE ARTHROPLASTY;  Surgeon: Lerner Melodi, MD;  Location: [facility];  Service: Orthopedics;  Laterality: Left;   Patient Active Problem List    Diagnosis Date Noted   OA (osteoarthritis) of hip 05/29/2023   Osteoarthritis of right hip 05/29/2023   Chest pain 02/10/2022   Asthma, chronic 02/10/2022   Hyperlipidemia 02/10/2022   Essential hypertension 02/10/2022   UTI (urinary tract infection) 02/10/2022   OA (osteoarthritis) of knee 08/08/2015   Osteoarthritis of right knee 06/04/2011    PCP: Dawn Collins  REFERRING PROVIDER: Asberry Collins  REFERRING DIAG: Z51.89: Encounter for other specified aftercare  R THA anterior approach  THERAPY DIAG:  Status post total replacement of right hip  Stiffness of right hip, not elsewhere classified  Muscle weakness (generalized)  Pain in right hip  Rationale for Evaluation and Treatment: Rehabilitation  ONSET DATE: 05/29/23  SUBJECTIVE:   SUBJECTIVE STATEMENT: I feel ok  PERTINENT HISTORY: HPI: Dawn Collins, 78 y.o. female, has a history of pain and functional disability in the right hip due to arthritis and patient has failed non-surgical conservative treatments for greater than 12 weeks to include use of assistive devices and activity modification. Onset of symptoms was gradual, starting 4 years ago with gradually worsening course since that time. The patient noted no past surgery on the right hip. Patient currently rates  pain in the right hip at 8 out of 10 with activity. Patient has worsening of pain with activity and weight bearing, trendelenberg gait, and pain that interfers with activities of daily living. Patient has evidence of periarticular osteophytes and joint space narrowing by imaging studies. This condition presents safety issues increasing the risk of falls. There is no current active infection.  THA took place 05/29/23   PAIN:  Are you having pain? Yes: NPRS scale: 4/10 Pain location: R hip and down my thigh Pain description: feels numb, sore  Aggravating factors: lifting my leg up kind of hurts, walking like it hurts  Relieving factors: Tylenol , ice  sometimes   PRECAUTIONS: Anterior hip - Do not step backwards with surgical leg. No hip extension. - Do not allow surgical leg to externally rotate (turn outwards). - Do not cross your legs. Use a pillow between legs when rolling. - Sleep on your surgical side when side lying. - No bending past 90d  RED FLAGS: None   WEIGHT BEARING RESTRICTIONS: No  FALLS:  Has patient fallen in last 6 months? No  LIVING ENVIRONMENT: Lives with: lives alone Lives in: House/apartment Stairs: No Has following equipment at home: Single point cane and Wheelchair (manual)  OCCUPATION: Retired from doing hair for 43 years   PLOF: Independent and Independent with basic ADLs  PATIENT GOALS: be able to walk straight again, and get my posture upright   NEXT MD VISIT: 07/09/23  OBJECTIVE:  Note: Objective measures were completed at Evaluation unless otherwise noted.  DIAGNOSTIC FINDINGS: INDINGS: FINDINGS: Right hip arthroplasty in expected alignment. No periprosthetic lucency or fracture. Recent postsurgical change includes air and edema in the soft tissues.   IMPRESSION: Right hip arthroplasty without immediate postoperative complication.  Two fluoroscopic spot views of the pelvis and right hip obtained in the operating room. Images during hip arthroplasty. Fluoroscopy time 7 seconds. Dose 1 mGy.   IMPRESSION: Intraoperative fluoroscopy during right hip arthroplasty.   COGNITION: Overall cognitive status: Within functional limits for tasks assessed     SENSATION: WFL  POSTURE: rounded shoulders, forward head, and flexed trunk   PALPATION: Some TTP and soreness in R hip   LOWER EXTREMITY ROM:  Active ROM Right eval Left eval  Hip flexion Pain >90d    Hip extension    Hip abduction 30   Hip adduction    Hip internal rotation 25   Hip external rotation 30   Knee flexion WFL   Knee extension [facility]   Ankle dorsiflexion    Ankle plantarflexion    Ankle inversion    Ankle  eversion     (Blank rows = not tested)  LOWER EXTREMITY MMT:  MMT Right eval Right  08/07/23  Hip flexion 2+ with pain 4- w/ pain  Hip extension    Hip abduction 4- 4+  Hip adduction 4 4+  Hip internal rotation 4-   Hip external rotation 2+ with pain   Knee flexion    Knee extension    Ankle dorsiflexion    Ankle plantarflexion    Ankle inversion    Ankle eversion     (Blank rows = not tested)  FUNCTIONAL TESTS:  5 times sit to stand: 20.55s Timed up and go (TUG): 19.50s  GAIT: Distance walked: in clinic distances Assistive device utilized: Single point cane and Wheelchair (manual) Level of assistance: Modified independence Comments: antalgic gait, decrease stance time and step length with RLE, decreased hip flexion on RLE   TODAY'S TREATMENT:  DATE:  08/27/23 Nustep level 5 x 6 minutes Hs curls 20lb 3x10 Leg Ext 5lb 2x10 S2S 2x5 Standing hip Abd and Ext AROM x10 each MHP to lumbar spine  08/07/23 Nustep level 5 x 6 minutes CHECKED GOALS  Seated green tband clamshells Hip Add ball squeeze  6in step ups x10 each Resisted gait forwards & backwards 20lb x3 each  Hs curls 20lb 2x10 Leg Ext 5lb 2x10  07/30/23 Nustep level 5 x 6 minutes 6in step ups x5 each Resisted sides steps 20lb x3 each Seated red tband clamshells MHP to lumbar spine x10 min   07/23/23 Nustep level 5 x 6 minutes Standing marching Standing hip abduction, some right hip pain Seated red tband clamshells Ball b/n knees squeeze Feet on ball K2C, rotation, small bridge, isometric abs Passive HS stretches STM tot he right ITB and HS very tender  07/12/23 Nustep level 5 x 6 minutes 20# leg curls 2x10 Tried leg extension with 5# painful in the knees 2.5# LAQ 2.5# marches 2# standing hip abduction Seated green tband clamshells Standing no weight extensions Sit to  stand with arm rest x 10 total Feet on ball K2C, rotation STM gentle to the right thigh, ITB and scar, very tender  07/08/23 NuStep L4 x45mins Hip Add ball squeeze Hip Abd green 2x10 S2S x15 some with UE HS curls 20lb 2x10 Leg Ext 5lb 2x10 Standing marches 2x10 MHP to lumbar spine x10 min    PATIENT EDUCATION:  Education details: POC and HEP  Person educated: Patient Education method: Explanation Education comprehension: verbalized understanding  HOME EXERCISE PROGRAM: Access Code: [email or web address] URL: [email or web address] Date: 06/26/2023 Prepared by: Almetta Fam  Exercises - Standing Hip Abduction with Counter Support  - 1 x daily - 7 x weekly - 2 sets - 10 reps - Standing March with Counter Support  - 1 x daily - 7 x weekly - 2 sets - 10 reps - Sit to Stand  - 1 x daily - 7 x weekly - 2 sets - 10 reps - Seated Hip Abduction with Resistance  - 1 x daily - 7 x weekly - 2 sets - 10 reps - Seated Hip Adduction Isometrics with Ball  - 1 x daily - 7 x weekly - 2 sets - 10 reps  ASSESSMENT:  CLINICAL IMPRESSION: Patient is a 78 y.o. female who was seen today for physical therapy treatment for R THA on 05/29/23. Pt returns to therapy after seeing the MD. She is doing well overall but continues to lack functional strength. Increase fatigue with sit to stands. Cues needed for LE spacing with sit to stands. Tactiel cue to prebent trunk compensation with standing hip abduction and extensions. Patient will benefit from skilled PT to address her R hip impairments to be able to return to PLOF and do her daily activities without pain.   OBJECTIVE IMPAIRMENTS: Abnormal gait, decreased mobility, difficulty walking, decreased ROM, decreased strength, and pain.   ACTIVITY LIMITATIONS: lifting, bending, sitting, standing, squatting, stairs, transfers, and locomotion level  PARTICIPATION LIMITATIONS: cleaning, laundry, driving, shopping, community activity, and yard work  [facility]  POTENTIAL: Good  CLINICAL DECISION MAKING: Stable/uncomplicated  EVALUATION COMPLEXITY: Low   GOALS: Goals reviewed with patient? Yes  SHORT TERM GOALS: Target date: 08/07/23  Patient will be independent with initial HEP. Baseline: given 06/26/23 Goal status: met 07/12/23  2.  Patient will complete TUG < 14s without AD Baseline: 19.50s Goal status:progressing 07/12/23   LONG TERM GOALS: Target date: 09/18/23  Patient will  be independent with advanced/ongoing HEP to improve outcomes and carryover.  Goal status: INITIAL  2.  Patient will report at least 75% improvement in R hip pain to improve QOL. Baseline: 6/10 pain Goal status: Met 85% 08/07/23  3.  Patient will demonstrate improved functional LE strength as demonstrated by 4/5 or better in hip MMT. Baseline: see chart above Goal status: progressing 07/23/23  4.  Patient will be able to ambulate 600' with LRAD and normal gait pattern without increased pain to access community.  Baseline: using RW , antalgic pattern Goal status: progressing 07/23/23  5.  Patient will complete 5xSTS < 15s from chair height Baseline: 20.55s Goal status: Progressing 16.81 08/07/23  PLAN:  PT FREQUENCY: 2x/week  PT DURATION: 12 weeks  PLANNED INTERVENTIONS: 97110-Therapeutic exercises, 97530- Therapeutic activity, 97112- Neuromuscular re-education, 97535- Self Care, [id number]- Manual therapy, [phone]- Gait training, [phone]- Vasopneumatic device, Patient/Family education, Balance training, Stair training, Joint mobilization, Scar mobilization, Cryotherapy, and Moist heat  PLAN FOR NEXT SESSION: progress strength and function, Pt goes to MD Tuesday.  Tanda KANDICE Sorrow, PTA 08/27/2023, 3:19 PM

## 2023-08-29 ENCOUNTER — Ambulatory Visit: Payer: Medicare Other | Admitting: Physical Therapy

## 2023-08-29 ENCOUNTER — Encounter: Payer: Self-pay | Admitting: Physical Therapy

## 2023-08-29 DIAGNOSIS — Z96641 Presence of right artificial hip joint: Secondary | ICD-10-CM | POA: Diagnosis not present

## 2023-08-29 DIAGNOSIS — R2689 Other abnormalities of gait and mobility: Secondary | ICD-10-CM

## 2023-08-29 DIAGNOSIS — M25651 Stiffness of right hip, not elsewhere classified: Secondary | ICD-10-CM

## 2023-08-29 DIAGNOSIS — M6281 Muscle weakness (generalized): Secondary | ICD-10-CM

## 2023-08-29 DIAGNOSIS — M25551 Pain in right hip: Secondary | ICD-10-CM

## 2023-08-29 NOTE — Therapy (Addendum)
 OUTPATIENT PHYSICAL THERAPY LOWER EXTREMITY  Progress Note Reporting Period 06/26/23 to 08/29/23  See note below for Objective Data and Assessment of Progress/Goals.      Patient Name: Dawn Collins MRN: [id number] DOB:[birth year], 78 y.o., female Today's Date: 08/29/2023  END OF SESSION:  PT End of Session - 08/29/23 1341     Visit Number 10    Date for PT Re-Evaluation 09/18/23              Past Medical History:  Diagnosis Date   Arthritis    knees, back.   Asthma    2 weeks cold,no problems now-well controlled   Bursitis, knee    Bil. Hips, not knee   Cancer (HCC)    Complication of anesthesia    slow to wake up   Diverticulitis of colon 05/28/2011   no current problems   GERD (gastroesophageal reflux disease)    tx. pantoprazole    History of hiatal hernia    Hypertension    Multiple thyroid nodules    Myocardial infarction (HCC)    Pneumonia    PONV (postoperative nausea and vomiting)    Sleep apnea    No cpap now -3 months last due to insurance reasons   Past Surgical History:  Procedure Laterality Date   ABDOMINAL HYSTERECTOMY     partial hyst   BREAST BIOPSY     several- all benign   CARDIAC CATHETERIZATION     10'11   CERVICAL DISC SURGERY     1977   FOOT SURGERY     Bilateral   KNEE ARTHROSCOPY     right '04   TOTAL HIP ARTHROPLASTY Right 05/29/2023   Procedure: RIGHT TOTAL HIP ARTHROPLASTY ANTERIOR APPROACH;  Surgeon: Melodi Lerner, MD;  Location: [facility];  Service: Orthopedics;  Laterality: Right;   TOTAL KNEE ARTHROPLASTY  06/04/2011   Procedure: TOTAL KNEE ARTHROPLASTY;  Surgeon: Lerner LULLA Melodi;  Location: [facility];  Service: Orthopedics;  Laterality: Right;   TOTAL KNEE ARTHROPLASTY Left 08/08/2015   Procedure: TOTAL LEFT KNEE ARTHROPLASTY;  Surgeon: Lerner Melodi, MD;  Location: [facility];  Service: Orthopedics;  Laterality: Left;   Patient Active Problem List   Diagnosis Date Noted   OA (osteoarthritis) of hip 05/29/2023   Osteoarthritis  of right hip 05/29/2023   Chest pain 02/10/2022   Asthma, chronic 02/10/2022   Hyperlipidemia 02/10/2022   Essential hypertension 02/10/2022   UTI (urinary tract infection) 02/10/2022   OA (osteoarthritis) of knee 08/08/2015   Osteoarthritis of right knee 06/04/2011    PCP: Rosalva Catchings  REFERRING PROVIDER: Asberry Kobs  REFERRING DIAG: Z51.89: Encounter for other specified aftercare  R THA anterior approach  THERAPY DIAG:  Status post total replacement of right hip  Stiffness of right hip, not elsewhere classified  Muscle weakness (generalized)  Pain in right hip  Other abnormalities of gait and mobility  Rationale for Evaluation and Treatment: Rehabilitation  ONSET DATE: 05/29/23  SUBJECTIVE:   SUBJECTIVE STATEMENT: I feel all right  PERTINENT HISTORY: HPI: Dawn Collins, 78 y.o. female, has a history of pain and functional disability in the right hip due to arthritis and patient has failed non-surgical conservative treatments for greater than 12 weeks to include use of assistive devices and activity modification. Onset of symptoms was gradual, starting 4 years ago with gradually worsening course since that time. The patient noted no past surgery on the right hip. Patient currently rates pain in the right hip at 8 out of 10 with activity. Patient  has worsening of pain with activity and weight bearing, trendelenberg gait, and pain that interfers with activities of daily living. Patient has evidence of periarticular osteophytes and joint space narrowing by imaging studies. This condition presents safety issues increasing the risk of falls. There is no current active infection.  THA took place 05/29/23   PAIN:  Are you having pain? Yes: NPRS scale: 3-4/10 Pain location: R hip and down my thigh Pain description: feels numb, sore  Aggravating factors: lifting my leg up kind of hurts, walking like it hurts  Relieving factors: Tylenol , ice sometimes   PRECAUTIONS:  Anterior hip - Do not step backwards with surgical leg. No hip extension. - Do not allow surgical leg to externally rotate (turn outwards). - Do not cross your legs. Use a pillow between legs when rolling. - Sleep on your surgical side when side lying. - No bending past 90d  RED FLAGS: None   WEIGHT BEARING RESTRICTIONS: No  FALLS:  Has patient fallen in last 6 months? No  LIVING ENVIRONMENT: Lives with: lives alone Lives in: House/apartment Stairs: No Has following equipment at home: Single point cane and Wheelchair (manual)  OCCUPATION: Retired from doing hair for 43 years   PLOF: Independent and Independent with basic ADLs  PATIENT GOALS: be able to walk straight again, and get my posture upright   NEXT MD VISIT: 07/09/23  OBJECTIVE:  Note: Objective measures were completed at Evaluation unless otherwise noted.  DIAGNOSTIC FINDINGS: INDINGS: FINDINGS: Right hip arthroplasty in expected alignment. No periprosthetic lucency or fracture. Recent postsurgical change includes air and edema in the soft tissues.   IMPRESSION: Right hip arthroplasty without immediate postoperative complication.  Two fluoroscopic spot views of the pelvis and right hip obtained in the operating room. Images during hip arthroplasty. Fluoroscopy time 7 seconds. Dose 1 mGy.   IMPRESSION: Intraoperative fluoroscopy during right hip arthroplasty.   COGNITION: Overall cognitive status: Within functional limits for tasks assessed     SENSATION: WFL  POSTURE: rounded shoulders, forward head, and flexed trunk   PALPATION: Some TTP and soreness in R hip   LOWER EXTREMITY ROM:  Active ROM Right eval Left eval  Hip flexion Pain >90d    Hip extension    Hip abduction 30   Hip adduction    Hip internal rotation 25   Hip external rotation 30   Knee flexion WFL   Knee extension [facility]   Ankle dorsiflexion    Ankle plantarflexion    Ankle inversion    Ankle eversion     (Blank rows = not  tested)  LOWER EXTREMITY MMT:  MMT Right eval Right  08/07/23 Right 08/29/23  Hip flexion 2+ with pain 4- w/ pain 4 w/ Pain  Hip extension     Hip abduction 4- 4+ 5  Hip adduction 4 4+ 5  Hip internal rotation 4-    Hip external rotation 2+ with pain    Knee flexion     Knee extension     Ankle dorsiflexion     Ankle plantarflexion     Ankle inversion     Ankle eversion      (Blank rows = not tested)  FUNCTIONAL TESTS:  5 times sit to stand: 20.55s Timed up and go (TUG): 19.50s  GAIT: Distance walked: in clinic distances Assistive device utilized: Single point cane and Wheelchair (manual) Level of assistance: Modified independence Comments: antalgic gait, decrease stance time and step length with RLE, decreased hip flexion on RLE  TODAY'S TREATMENT:                                                                                                                              DATE:  08/29/23 NuStep L5 x 6 min Hs curls 25lb 3x10 Leg Ext 5lb 2x10 4in lateral step ups x10 8in forward step ups x10 each Sit to stand holding red ball x10  Standing at counter hip flx and abd x10 each MHP to lumbar spine  08/27/23 Nustep level 5 x 6 minutes Hs curls 20lb 3x10 Leg Ext 5lb 2x10 S2S 2x5 Standing hip Abd and Ext AROM x10 each MHP to lumbar spine  08/07/23 Nustep level 5 x 6 minutes CHECKED GOALS  Seated green tband clamshells Hip Add ball squeeze  6in step ups x10 each Resisted gait forwards & backwards 20lb x3 each  Hs curls 20lb 2x10 Leg Ext 5lb 2x10  07/30/23 Nustep level 5 x 6 minutes 6in step ups x5 each Resisted sides steps 20lb x3 each Seated red tband clamshells MHP to lumbar spine x10 min   PATIENT EDUCATION:  Education details: POC and HEP  Person educated: Patient Education method: Explanation Education comprehension: verbalized understanding  HOME EXERCISE PROGRAM: Access Code: [email or web address] URL: [email or web address] Date:  06/26/2023 Prepared by: Almetta Fam  Exercises - Standing Hip Abduction with Counter Support  - 1 x daily - 7 x weekly - 2 sets - 10 reps - Standing March with Counter Support  - 1 x daily - 7 x weekly - 2 sets - 10 reps - Sit to Stand  - 1 x daily - 7 x weekly - 2 sets - 10 reps - Seated Hip Abduction with Resistance  - 1 x daily - 7 x weekly - 2 sets - 10 reps - Seated Hip Adduction Isometrics with Ball  - 1 x daily - 7 x weekly - 2 sets - 10 reps  ASSESSMENT:  CLINICAL IMPRESSION: Patient is a 78 y.o. female who was seen today for physical therapy treatment for R THA on 05/29/23. Pt enters doing well. She has progressed meeting some short and long term goals. She does well overall but doe have some increase fatigue with functional interventions. Tactiel cue to prebent trunk compensation with standing hip abduction and extensions. Cued needed for full ROM with leg extensions. Patient will benefit from skilled PT to address her R hip impairments to be able to return to PLOF and do her daily activities without pain.   OBJECTIVE IMPAIRMENTS: Abnormal gait, decreased mobility, difficulty walking, decreased ROM, decreased strength, and pain.   ACTIVITY LIMITATIONS: lifting, bending, sitting, standing, squatting, stairs, transfers, and locomotion level  PARTICIPATION LIMITATIONS: cleaning, laundry, driving, shopping, community activity, and yard work  [facility] POTENTIAL: Good  CLINICAL DECISION MAKING: Stable/uncomplicated  EVALUATION COMPLEXITY: Low   GOALS: Goals reviewed with patient? Yes  SHORT TERM GOALS: Target date: 08/07/23  Patient will be independent with initial HEP. Baseline: given 06/26/23 Goal status:  met 07/12/23  2.  Patient will complete TUG < 14s without AD Baseline: 19.50s Goal status: 08/29/23 Met 13.88   LONG TERM GOALS: Target date: 09/18/23  Patient will be independent with advanced/ongoing HEP to improve outcomes and carryover.  Goal status: INITIAL  2.   Patient will report at least 75% improvement in R hip pain to improve QOL. Baseline: 6/10 pain Goal status: Met 85% 08/07/23  3.  Patient will demonstrate improved functional LE strength as demonstrated by 4/5 or better in hip MMT. Baseline: see chart above Goal status: Met  08/29/23  4.  Patient will be able to ambulate 600' with LRAD and normal gait pattern without increased pain to access community.  Baseline: using RW , antalgic pattern Goal status: progressing 07/23/23  5.  Patient will complete 5xSTS < 15s from chair height Baseline: 20.55s Goal status: Met 11.81  08/29/23  PLAN:  PT FREQUENCY: 2x/week  PT DURATION: 12 weeks  PLANNED INTERVENTIONS: 97110-Therapeutic exercises, 97530- Therapeutic activity, 97112- Neuromuscular re-education, 97535- Self Care, 02859- Manual therapy, [phone]- Gait training, [phone]- Vasopneumatic device, Patient/Family education, Balance training, Stair training, Joint mobilization, Scar mobilization, Cryotherapy, and Moist heat  PLAN FOR NEXT SESSION: Functional hip strenght  Tanda KANDICE Sorrow, PTA 08/29/2023, 1:41 PM

## 2023-09-05 ENCOUNTER — Ambulatory Visit: Payer: Medicare Other | Admitting: Physical Therapy

## 2023-09-05 DIAGNOSIS — M6281 Muscle weakness (generalized): Secondary | ICD-10-CM

## 2023-09-05 DIAGNOSIS — Z96641 Presence of right artificial hip joint: Secondary | ICD-10-CM

## 2023-09-05 DIAGNOSIS — M25651 Stiffness of right hip, not elsewhere classified: Secondary | ICD-10-CM

## 2023-09-05 DIAGNOSIS — M25551 Pain in right hip: Secondary | ICD-10-CM

## 2023-09-05 NOTE — Therapy (Signed)
OUTPATIENT PHYSICAL THERAPY LOWER EXTREMITY    Patient Name: Dawn Collins MRN: [id number] DOB:[birth year], 78 y.o., female Today's Date: 09/05/2023  END OF SESSION:  PT End of Session - 09/05/23 1345     Visit Number 11    Date for PT Re-Evaluation 09/18/23    Authorization Type Medicare    PT Start Time 1345    PT Stop Time 1430    PT Time Calculation (min) 45 min              Past Medical History:  Diagnosis Date   Arthritis    knees, back.   Asthma    2 weeks cold,no problems now-well controlled   Bursitis, knee    Bil. Hips, not knee   Cancer (HCC)    Complication of anesthesia    slow to wake up   Diverticulitis of colon 05/28/2011   no current problems   GERD (gastroesophageal reflux disease)    tx. pantoprazole   History of hiatal hernia    Hypertension    Multiple thyroid nodules    Myocardial infarction (HCC)    Pneumonia    PONV (postoperative nausea and vomiting)    Sleep apnea    No cpap now -3 months last due to insurance reasons   Past Surgical History:  Procedure Laterality Date   ABDOMINAL HYSTERECTOMY     partial hyst   BREAST BIOPSY     several- all benign   CARDIAC CATHETERIZATION     10'11   CERVICAL DISC SURGERY     1977   FOOT SURGERY     Bilateral   KNEE ARTHROSCOPY     right '04   TOTAL HIP ARTHROPLASTY Right 05/29/2023   Procedure: RIGHT TOTAL HIP ARTHROPLASTY ANTERIOR APPROACH;  Surgeon: Ollen Gross, MD;  Location: [facility];  Service: Orthopedics;  Laterality: Right;   TOTAL KNEE ARTHROPLASTY  06/04/2011   Procedure: TOTAL KNEE ARTHROPLASTY;  Surgeon: Loanne Drilling;  Location: [facility];  Service: Orthopedics;  Laterality: Right;   TOTAL KNEE ARTHROPLASTY Left 08/08/2015   Procedure: TOTAL LEFT KNEE ARTHROPLASTY;  Surgeon: Ollen Gross, MD;  Location: [facility];  Service: Orthopedics;  Laterality: Left;   Patient Active Problem List   Diagnosis Date Noted   OA (osteoarthritis) of hip 05/29/2023   Osteoarthritis of right  hip 05/29/2023   Chest pain 02/10/2022   Asthma, chronic 02/10/2022   Hyperlipidemia 02/10/2022   Essential hypertension 02/10/2022   UTI (urinary tract infection) 02/10/2022   OA (osteoarthritis) of knee 08/08/2015   Osteoarthritis of right knee 06/04/2011    PCP: Gardiner Fanti  REFERRING PROVIDER: Eartha Inch  REFERRING DIAG: Z51.89: Encounter for other specified aftercare  R THA anterior approach  THERAPY DIAG:  Status post total replacement of right hip  Stiffness of right hip, not elsewhere classified  Muscle weakness (generalized)  Pain in right hip  Rationale for Evaluation and Treatment: Rehabilitation  ONSET DATE: 05/29/23  SUBJECTIVE:   SUBJECTIVE STATEMENT: Therapy is helping for sure  PERTINENT HISTORY: HPI: Dawn Collins, 78 y.o. female, has a history of pain and functional disability in the right hip due to arthritis and patient has failed non-surgical conservative treatments for greater than 12 weeks to include use of assistive devices and activity modification. Onset of symptoms was gradual, starting 4 years ago with gradually worsening course since that time. The patient noted no past surgery on the right hip. Patient currently rates pain in the right hip at 8 out of 10 with  activity. Patient has worsening of pain with activity and weight bearing, trendelenberg gait, and pain that interfers with activities of daily living. Patient has evidence of periarticular osteophytes and joint space narrowing by imaging studies. This condition presents safety issues increasing the risk of falls. There is no current active infection.  THA took place 05/29/23   PAIN:  Are you having pain? Yes: NPRS scale: 4/10 Pain location: R hip and down my thigh Pain description: feels numb, sore  Aggravating factors: lifting my leg up kind of hurts, walking like it hurts  Relieving factors: Tylenol, ice sometimes   PRECAUTIONS: Anterior hip - Do not step backwards with  surgical leg. No hip extension. - Do not allow surgical leg to externally rotate (turn outwards). - Do not cross your legs. Use a pillow between legs when rolling. - Sleep on your surgical side when side lying. - No bending past 90d  RED FLAGS: None   WEIGHT BEARING RESTRICTIONS: No  FALLS:  Has patient fallen in last 6 months? No  LIVING ENVIRONMENT: Lives with: lives alone Lives in: House/apartment Stairs: No Has following equipment at home: Single point cane and Wheelchair (manual)  OCCUPATION: Retired from doing hair for 43 years   PLOF: Independent and Independent with basic ADLs  PATIENT GOALS: be able to walk straight again, and get my posture upright   NEXT MD VISIT: 07/09/23  OBJECTIVE:  Note: Objective measures were completed at Evaluation unless otherwise noted.  DIAGNOSTIC FINDINGS: INDINGS: FINDINGS: Right hip arthroplasty in expected alignment. No periprosthetic lucency or fracture. Recent postsurgical change includes air and edema in the soft tissues.   IMPRESSION: Right hip arthroplasty without immediate postoperative complication.  Two fluoroscopic spot views of the pelvis and right hip obtained in the operating room. Images during hip arthroplasty. Fluoroscopy time 7 seconds. Dose 1 mGy.   IMPRESSION: Intraoperative fluoroscopy during right hip arthroplasty.   COGNITION: Overall cognitive status: Within functional limits for tasks assessed     SENSATION: WFL  POSTURE: rounded shoulders, forward head, and flexed trunk   PALPATION: Some TTP and soreness in R hip   LOWER EXTREMITY ROM:  Active ROM Right eval Left eval  Hip flexion Pain >90d    Hip extension    Hip abduction 30   Hip adduction    Hip internal rotation 25   Hip external rotation 30   Knee flexion WFL   Knee extension [facility]   Ankle dorsiflexion    Ankle plantarflexion    Ankle inversion    Ankle eversion     (Blank rows = not tested)  LOWER EXTREMITY MMT:  MMT  Right eval Right  08/07/23  Hip flexion 2+ with pain 4- w/ pain  Hip extension    Hip abduction 4- 4+  Hip adduction 4 4+  Hip internal rotation 4-   Hip external rotation 2+ with pain   Knee flexion    Knee extension    Ankle dorsiflexion    Ankle plantarflexion    Ankle inversion    Ankle eversion     (Blank rows = not tested)  FUNCTIONAL TESTS:  5 times sit to stand: 20.55s Timed up and go (TUG): 19.50s  GAIT: Distance walked: in clinic distances Assistive device utilized: Single point cane and Wheelchair (manual) Level of assistance: Modified independence Comments: antalgic gait, decrease stance time and step length with RLE, decreased hip flexion on RLE   TODAY'S TREATMENT:  DATE:   09/05/23 Nustep L 5 HS curls 20lb 3x10 Leg Ext 5lb 2x10 S2S 2x5 Standing hip Abd and Ext AROM x10 each yellow tband ADD ball squeeze Seated yellow tband hip ABD 10x    08/27/23 Nustep level 5 x 6 minutes Hs curls 20lb 3x10 Leg Ext 5lb 2x10 S2S 2x5 Standing hip Abd and Ext AROM x10 each MHP to lumbar spine  08/07/23 Nustep level 5 x 6 minutes CHECKED GOALS  Seated green tband clamshells Hip Add ball squeeze  6in step ups x10 each Resisted gait forwards & backwards 20lb x3 each  Hs curls 20lb 2x10 Leg Ext 5lb 2x10  07/30/23 Nustep level 5 x 6 minutes 6in step ups x5 each Resisted sides steps 20lb x3 each Seated red tband clamshells MHP to lumbar spine x10 min   07/23/23 Nustep level 5 x 6 minutes Standing marching Standing hip abduction, some right hip pain Seated red tband clamshells Ball b/n knees squeeze Feet on ball K2C, rotation, small bridge, isometric abs Passive HS stretches STM tot he right ITB and HS very tender  07/12/23 Nustep level 5 x 6 minutes 20# leg curls 2x10 Tried leg extension with 5# painful in the knees 2.5#  LAQ 2.5# marches 2# standing hip abduction Seated green tband clamshells Standing no weight extensions Sit to stand with arm rest x 10 total Feet on ball K2C, rotation STM gentle to the right thigh, ITB and scar, very tender  07/08/23 NuStep L4 x76mins Hip Add ball squeeze Hip Abd green 2x10 S2S x15 some with UE HS curls 20lb 2x10 Leg Ext 5lb 2x10 Standing marches 2x10 MHP to lumbar spine x10 min    PATIENT EDUCATION:  Education details: POC and HEP  Person educated: Patient Education method: Explanation Education comprehension: verbalized understanding  HOME EXERCISE PROGRAM: Access Code: [email or web address] URL: [email or web address] Date: 06/26/2023 Prepared by: Cassie Freer  Exercises - Standing Hip Abduction with Counter Support  - 1 x daily - 7 x weekly - 2 sets - 10 reps - Standing March with Counter Support  - 1 x daily - 7 x weekly - 2 sets - 10 reps - Sit to Stand  - 1 x daily - 7 x weekly - 2 sets - 10 reps - Seated Hip Abduction with Resistance  - 1 x daily - 7 x weekly - 2 sets - 10 reps - Seated Hip Adduction Isometrics with Ball  - 1 x daily - 7 x weekly - 2 sets - 10 reps  ASSESSMENT:  CLINICAL IMPRESSION: Progressed ex with resistance some pain with standing RT hip flexion. Freq seated rest required d/t pain. OBJECTIVE IMPAIRMENTS: Abnormal gait, decreased mobility, difficulty walking, decreased ROM, decreased strength, and pain.   ACTIVITY LIMITATIONS: lifting, bending, sitting, standing, squatting, stairs, transfers, and locomotion level  PARTICIPATION LIMITATIONS: cleaning, laundry, driving, shopping, community activity, and yard work  [facility] POTENTIAL: Good  CLINICAL DECISION MAKING: Stable/uncomplicated  EVALUATION COMPLEXITY: Low   GOALS: Goals reviewed with patient? Yes  SHORT TERM GOALS: Target date: 08/07/23  Patient will be independent with initial HEP. Baseline: given 06/26/23 Goal status: met 07/12/23  2.  Patient will  complete TUG < 14s without AD Baseline: 19.50s Goal status:progressing 07/12/23   LONG TERM GOALS: Target date: 09/18/23  Patient will be independent with advanced/ongoing HEP to improve outcomes and carryover.  Goal status: INITIAL  2.  Patient will report at least 75% improvement in R hip pain to improve QOL. Baseline: 6/10 pain Goal status: Met 85%  08/07/23  3.  Patient will demonstrate improved functional LE strength as demonstrated by 4/5 or better in hip MMT. Baseline: see chart above Goal status: progressing 07/23/23  4.  Patient will be able to ambulate 600' with LRAD and normal gait pattern without increased pain to access community.  Baseline: using RW , antalgic pattern Goal status: progressing 07/23/23  5.  Patient will complete 5xSTS < 15s from chair height Baseline: 20.55s Goal status: Progressing 16.81 08/07/23  PLAN:  PT FREQUENCY: 2x/week  PT DURATION: 12 weeks  PLANNED INTERVENTIONS: 97110-Therapeutic exercises, 97530- Therapeutic activity, 97112- Neuromuscular re-education, 97535- Self Care, 16109- Manual therapy, [phone]- Gait training, [phone]- Vasopneumatic device, Patient/Family education, Balance training, Stair training, Joint mobilization, Scar mobilization, Cryotherapy, and Moist heat  PLAN FOR NEXT SESSION: progress strength and function,   Lilyian Quayle,ANGIE, PTA 09/05/2023, 1:45 PM [place] [facility] [place], [place], [address] Phone: [phone]   Fax:  [phone]  Patient Details  Name: Timmie Calix MRN: [id number] Date of Birth: 12-May-1946 Referring Provider:  Bernerd Pho*  Encounter Date: 09/05/2023   Suanne Marker, PTA 09/05/2023, 1:45 PM  [facility] [place], [place], [address] Phone: [phone]   Fax:  [phone]

## 2023-09-10 ENCOUNTER — Encounter: Payer: Self-pay | Admitting: Physical Therapy

## 2023-09-10 ENCOUNTER — Ambulatory Visit: Payer: Medicare Other | Admitting: Physical Therapy

## 2023-09-10 DIAGNOSIS — M6281 Muscle weakness (generalized): Secondary | ICD-10-CM

## 2023-09-10 DIAGNOSIS — Z96641 Presence of right artificial hip joint: Secondary | ICD-10-CM

## 2023-09-10 DIAGNOSIS — M25551 Pain in right hip: Secondary | ICD-10-CM

## 2023-09-10 DIAGNOSIS — M25651 Stiffness of right hip, not elsewhere classified: Secondary | ICD-10-CM

## 2023-09-10 NOTE — Therapy (Signed)
OUTPATIENT PHYSICAL THERAPY LOWER EXTREMITY    Patient Name: Dawn Collins MRN: [id number] DOB:[birth year], 78 y.o., female Today's Date: 09/10/2023  END OF SESSION:  PT End of Session - 09/10/23 1259     Visit Number 12    Date for PT Re-Evaluation 09/18/23    PT Start Time 1300    PT Stop Time 1350    PT Time Calculation (min) 50 min    Activity Tolerance Patient tolerated treatment well    Behavior During Therapy [facility] for tasks assessed/performed              Past Medical History:  Diagnosis Date   Arthritis    knees, back.   Asthma    2 weeks cold,no problems now-well controlled   Bursitis, knee    Bil. Hips, not knee   Cancer (HCC)    Complication of anesthesia    slow to wake up   Diverticulitis of colon 05/28/2011   no current problems   GERD (gastroesophageal reflux disease)    tx. pantoprazole   History of hiatal hernia    Hypertension    Multiple thyroid nodules    Myocardial infarction (HCC)    Pneumonia    PONV (postoperative nausea and vomiting)    Sleep apnea    No cpap now -3 months last due to insurance reasons   Past Surgical History:  Procedure Laterality Date   ABDOMINAL HYSTERECTOMY     partial hyst   BREAST BIOPSY     several- all benign   CARDIAC CATHETERIZATION     10'11   CERVICAL DISC SURGERY     1977   FOOT SURGERY     Bilateral   KNEE ARTHROSCOPY     right '04   TOTAL HIP ARTHROPLASTY Right 05/29/2023   Procedure: RIGHT TOTAL HIP ARTHROPLASTY ANTERIOR APPROACH;  Surgeon: Ollen Gross, MD;  Location: [facility];  Service: Orthopedics;  Laterality: Right;   TOTAL KNEE ARTHROPLASTY  06/04/2011   Procedure: TOTAL KNEE ARTHROPLASTY;  Surgeon: Loanne Drilling;  Location: [facility];  Service: Orthopedics;  Laterality: Right;   TOTAL KNEE ARTHROPLASTY Left 08/08/2015   Procedure: TOTAL LEFT KNEE ARTHROPLASTY;  Surgeon: Ollen Gross, MD;  Location: [facility];  Service: Orthopedics;  Laterality: Left;   Patient Active Problem List    Diagnosis Date Noted   OA (osteoarthritis) of hip 05/29/2023   Osteoarthritis of right hip 05/29/2023   Chest pain 02/10/2022   Asthma, chronic 02/10/2022   Hyperlipidemia 02/10/2022   Essential hypertension 02/10/2022   UTI (urinary tract infection) 02/10/2022   OA (osteoarthritis) of knee 08/08/2015   Osteoarthritis of right knee 06/04/2011    PCP: Gardiner Fanti  REFERRING PROVIDER: Eartha Inch  REFERRING DIAG: Z51.89: Encounter for other specified aftercare  R THA anterior approach  THERAPY DIAG:  Status post total replacement of right hip  Stiffness of right hip, not elsewhere classified  Muscle weakness (generalized)  Pain in right hip  Rationale for Evaluation and Treatment: Rehabilitation  ONSET DATE: 05/29/23  SUBJECTIVE:   SUBJECTIVE STATEMENT: Doing ok, having some trouble moving her R shoulder today.  PERTINENT HISTORY: HPI: Dawn Collins, 78 y.o. female, has a history of pain and functional disability in the right hip due to arthritis and patient has failed non-surgical conservative treatments for greater than 12 weeks to include use of assistive devices and activity modification. Onset of symptoms was gradual, starting 4 years ago with gradually worsening course since that time. The patient noted no past surgery  on the right hip. Patient currently rates pain in the right hip at 8 out of 10 with activity. Patient has worsening of pain with activity and weight bearing, trendelenberg gait, and pain that interfers with activities of daily living. Patient has evidence of periarticular osteophytes and joint space narrowing by imaging studies. This condition presents safety issues increasing the risk of falls. There is no current active infection.  THA took place 05/29/23   PAIN:  Are you having pain? Yes: NPRS scale: 2-3/10 Pain location: L hip and down my thigh Pain description: feels numb, sore  Aggravating factors: lifting my leg up kind of hurts, walking  like it hurts  Relieving factors: Tylenol, ice sometimes   PRECAUTIONS: Anterior hip - Do not step backwards with surgical leg. No hip extension. - Do not allow surgical leg to externally rotate (turn outwards). - Do not cross your legs. Use a pillow between legs when rolling. - Sleep on your surgical side when side lying. - No bending past 90d  RED FLAGS: None   WEIGHT BEARING RESTRICTIONS: No  FALLS:  Has patient fallen in last 6 months? No  LIVING ENVIRONMENT: Lives with: lives alone Lives in: House/apartment Stairs: No Has following equipment at home: Single point cane and Wheelchair (manual)  OCCUPATION: Retired from doing hair for 43 years   PLOF: Independent and Independent with basic ADLs  PATIENT GOALS: be able to walk straight again, and get my posture upright   NEXT MD VISIT: 07/09/23  OBJECTIVE:  Note: Objective measures were completed at Evaluation unless otherwise noted.  DIAGNOSTIC FINDINGS: INDINGS: FINDINGS: Right hip arthroplasty in expected alignment. No periprosthetic lucency or fracture. Recent postsurgical change includes air and edema in the soft tissues.   IMPRESSION: Right hip arthroplasty without immediate postoperative complication.  Two fluoroscopic spot views of the pelvis and right hip obtained in the operating room. Images during hip arthroplasty. Fluoroscopy time 7 seconds. Dose 1 mGy.   IMPRESSION: Intraoperative fluoroscopy during right hip arthroplasty.   COGNITION: Overall cognitive status: Within functional limits for tasks assessed     SENSATION: WFL  POSTURE: rounded shoulders, forward head, and flexed trunk   PALPATION: Some TTP and soreness in R hip   LOWER EXTREMITY ROM:  Active ROM Right eval Left eval  Hip flexion Pain >90d    Hip extension    Hip abduction 30   Hip adduction    Hip internal rotation 25   Hip external rotation 30   Knee flexion WFL   Knee extension [facility]   Ankle dorsiflexion    Ankle  plantarflexion    Ankle inversion    Ankle eversion     (Blank rows = not tested)  LOWER EXTREMITY MMT:  MMT Right eval Right  08/07/23  Hip flexion 2+ with pain 4- w/ pain  Hip extension    Hip abduction 4- 4+  Hip adduction 4 4+  Hip internal rotation 4-   Hip external rotation 2+ with pain   Knee flexion    Knee extension    Ankle dorsiflexion    Ankle plantarflexion    Ankle inversion    Ankle eversion     (Blank rows = not tested)  FUNCTIONAL TESTS:  5 times sit to stand: 20.55s Timed up and go (TUG): 19.50s  GAIT: Distance walked: in clinic distances Assistive device utilized: Single point cane and Wheelchair (manual) Level of assistance: Modified independence Comments: antalgic gait, decrease stance time and step length with RLE, decreased hip flexion  on RLE   TODAY'S TREATMENT:                                                                                                                              DATE:  09/10/23 NuStep L 3 x 6 min LE only  Resisted side steps 10lb x 3 each HS curls 25lb 2x12  Leg Ext 5lb 2x10 S2S 2x5 Hip add ball squeeze Hip abd 2x10 green  MHP lumbar spine  09/05/23 Nustep L 5 HS curls 20lb 3x10 Leg Ext 5lb 2x10 S2S 2x5 Standing hip Abd and Ext AROM x10 each yellow tband ADD ball squeeze Seated yellow tband hip ABD 10x  08/27/23 Nustep level 5 x 6 minutes Hs curls 20lb 3x10 Leg Ext 5lb 2x10 S2S 2x5 Standing hip Abd and Ext AROM x10 each MHP to lumbar spine  08/07/23 Nustep level 5 x 6 minutes CHECKED GOALS  Seated green tband clamshells Hip Add ball squeeze  6in step ups x10 each Resisted gait forwards & backwards 20lb x3 each  Hs curls 20lb 2x10 Leg Ext 5lb 2x10  07/30/23 Nustep level 5 x 6 minutes 6in step ups x5 each Resisted sides steps 20lb x3 each Seated red tband clamshells MHP to lumbar spine x10 min   07/23/23 Nustep level 5 x 6 minutes Standing marching Standing hip abduction, some right hip  pain Seated red tband clamshells Ball b/n knees squeeze Feet on ball K2C, rotation, small bridge, isometric abs Passive HS stretches STM tot he right ITB and HS very tender  07/12/23 Nustep level 5 x 6 minutes 20# leg curls 2x10 Tried leg extension with 5# painful in the knees 2.5# LAQ 2.5# marches 2# standing hip abduction Seated green tband clamshells Standing no weight extensions Sit to stand with arm rest x 10 total Feet on ball K2C, rotation STM gentle to the right thigh, ITB and scar, very tender  07/08/23 NuStep L4 x71mins Hip Add ball squeeze Hip Abd green 2x10 S2S x15 some with UE HS curls 20lb 2x10 Leg Ext 5lb 2x10 Standing marches 2x10 MHP to lumbar spine x10 min    PATIENT EDUCATION:  Education details: POC and HEP  Person educated: Patient Education method: Explanation Education comprehension: verbalized understanding  HOME EXERCISE PROGRAM: Access Code: [email or web address] URL: [email or web address] Date: 06/26/2023 Prepared by: Cassie Freer  Exercises - Standing Hip Abduction with Counter Support  - 1 x daily - 7 x weekly - 2 sets - 10 reps - Standing March with Counter Support  - 1 x daily - 7 x weekly - 2 sets - 10 reps - Sit to Stand  - 1 x daily - 7 x weekly - 2 sets - 10 reps - Seated Hip Abduction with Resistance  - 1 x daily - 7 x weekly - 2 sets - 10 reps - Seated Hip Adduction Isometrics with Ball  - 1 x daily - 7 x weekly - 2 sets - 10 reps  ASSESSMENT:  CLINICAL IMPRESSION: Continued to progressed with resistance interventions, cueing needed for eccentric control with curls and extensions. Fatigue with sit to stands.    OBJECTIVE IMPAIRMENTS: Abnormal gait, decreased mobility, difficulty walking, decreased ROM, decreased strength, and pain.   ACTIVITY LIMITATIONS: lifting, bending, sitting, standing, squatting, stairs, transfers, and locomotion level  PARTICIPATION LIMITATIONS: cleaning, laundry, driving, shopping, community  activity, and yard work  [facility] POTENTIAL: Good  CLINICAL DECISION MAKING: Stable/uncomplicated  EVALUATION COMPLEXITY: Low   GOALS: Goals reviewed with patient? Yes  SHORT TERM GOALS: Target date: 08/07/23  Patient will be independent with initial HEP. Baseline: given 06/26/23 Goal status: met 07/12/23  2.  Patient will complete TUG < 14s without AD Baseline: 19.50s Goal status:progressing 07/12/23   LONG TERM GOALS: Target date: 09/18/23  Patient will be independent with advanced/ongoing HEP to improve outcomes and carryover.  Goal status: INITIAL  2.  Patient will report at least 75% improvement in R hip pain to improve QOL. Baseline: 6/10 pain Goal status: Met 85% 08/07/23  3.  Patient will demonstrate improved functional LE strength as demonstrated by 4/5 or better in hip MMT. Baseline: see chart above Goal status: progressing 07/23/23  4.  Patient will be able to ambulate 600' with LRAD and normal gait pattern without increased pain to access community.  Baseline: using RW , antalgic pattern Goal status: progressing 07/23/23  5.  Patient will complete 5xSTS < 15s from chair height Baseline: 20.55s Goal status: Progressing 16.81 08/07/23  PLAN:  PT FREQUENCY: 2x/week  PT DURATION: 12 weeks  PLANNED INTERVENTIONS: 97110-Therapeutic exercises, 97530- Therapeutic activity, 97112- Neuromuscular re-education, 97535- Self Care, 91478- Manual therapy, [phone]- Gait training, [phone]- Vasopneumatic device, Patient/Family education, Balance training, Stair training, Joint mobilization, Scar mobilization, Cryotherapy, and Moist heat  PLAN FOR NEXT SESSION: progress strength and function,   Grayce Sessions, PTA

## 2023-09-12 ENCOUNTER — Ambulatory Visit: Payer: Medicare Other | Admitting: Physical Therapy

## 2023-09-17 ENCOUNTER — Encounter: Payer: Self-pay | Admitting: Physical Therapy

## 2023-09-17 ENCOUNTER — Ambulatory Visit: Payer: Medicare Other | Admitting: Physical Therapy

## 2023-09-17 DIAGNOSIS — M25551 Pain in right hip: Secondary | ICD-10-CM

## 2023-09-17 DIAGNOSIS — M6281 Muscle weakness (generalized): Secondary | ICD-10-CM

## 2023-09-17 DIAGNOSIS — Z96641 Presence of right artificial hip joint: Secondary | ICD-10-CM

## 2023-09-17 DIAGNOSIS — M25651 Stiffness of right hip, not elsewhere classified: Secondary | ICD-10-CM

## 2023-09-17 NOTE — Therapy (Signed)
 OUTPATIENT PHYSICAL THERAPY LOWER EXTREMITY    Patient Name: Dawn Collins MRN: [id number] DOB:04/08/1946, 78 y.o., female Today's Date: 09/17/2023  END OF SESSION:  PT End of Session - 09/17/23 1511     Visit Number 13    Date for PT Re-Evaluation 09/18/23    PT Start Time 1515    PT Stop Time 1600    PT Time Calculation (min) 45 min    Activity Tolerance Patient tolerated treatment well    Behavior During Therapy [facility] for tasks assessed/performed              Past Medical History:  Diagnosis Date   Arthritis    knees, back.   Asthma    2 weeks cold,no problems now-well controlled   Bursitis, knee    Bil. Hips, not knee   Cancer (HCC)    Complication of anesthesia    slow to wake up   Diverticulitis of colon 05/28/2011   no current problems   GERD (gastroesophageal reflux disease)    tx. pantoprazole   History of hiatal hernia    Hypertension    Multiple thyroid nodules    Myocardial infarction (HCC)    Pneumonia    PONV (postoperative nausea and vomiting)    Sleep apnea    No cpap now -3 months last due to insurance reasons   Past Surgical History:  Procedure Laterality Date   ABDOMINAL HYSTERECTOMY     partial hyst   BREAST BIOPSY     several- all benign   CARDIAC CATHETERIZATION     10'11   CERVICAL DISC SURGERY     1977   FOOT SURGERY     Bilateral   KNEE ARTHROSCOPY     right '04   TOTAL HIP ARTHROPLASTY Right 05/29/2023   Procedure: RIGHT TOTAL HIP ARTHROPLASTY ANTERIOR APPROACH;  Surgeon: Ollen Gross, MD;  Location: [facility];  Service: Orthopedics;  Laterality: Right;   TOTAL KNEE ARTHROPLASTY  06/04/2011   Procedure: TOTAL KNEE ARTHROPLASTY;  Surgeon: Loanne Drilling;  Location: [facility];  Service: Orthopedics;  Laterality: Right;   TOTAL KNEE ARTHROPLASTY Left 08/08/2015   Procedure: TOTAL LEFT KNEE ARTHROPLASTY;  Surgeon: Ollen Gross, MD;  Location: [facility];  Service: Orthopedics;  Laterality: Left;   Patient Active Problem List    Diagnosis Date Noted   OA (osteoarthritis) of hip 05/29/2023   Osteoarthritis of right hip 05/29/2023   Chest pain 02/10/2022   Asthma, chronic 02/10/2022   Hyperlipidemia 02/10/2022   Essential hypertension 02/10/2022   UTI (urinary tract infection) 02/10/2022   OA (osteoarthritis) of knee 08/08/2015   Osteoarthritis of right knee 06/04/2011    PCP: Gardiner Fanti  REFERRING PROVIDER: Eartha Inch  REFERRING DIAG: Z51.89: Encounter for other specified aftercare  R THA anterior approach  THERAPY DIAG:  Status post total replacement of right hip  Stiffness of right hip, not elsewhere classified  Muscle weakness (generalized)  Pain in right hip  Rationale for Evaluation and Treatment: Rehabilitation  ONSET DATE: 05/29/23  SUBJECTIVE:   SUBJECTIVE STATEMENT: "Good" R arm is getting better. Hip is pretty good  PERTINENT HISTORY: HPI: Dawn Collins, 78 y.o. female, has a history of pain and functional disability in the right hip due to arthritis and patient has failed non-surgical conservative treatments for greater than 12 weeks to include use of assistive devices and activity modification. Onset of symptoms was gradual, starting 4 years ago with gradually worsening course since that time. The patient noted no past surgery  on the right hip. Patient currently rates pain in the right hip at 8 out of 10 with activity. Patient has worsening of pain with activity and weight bearing, trendelenberg gait, and pain that interfers with activities of daily living. Patient has evidence of periarticular osteophytes and joint space narrowing by imaging studies. This condition presents safety issues increasing the risk of falls. There is no current active infection.  THA took place 05/29/23   PAIN:  Are you having pain? Yes: NPRS scale: 0/10 Pain location: L hip and down my thigh Pain description: feels numb, sore  Aggravating factors: lifting my leg up kind of hurts, walking like it  hurts  Relieving factors: Tylenol, ice sometimes   PRECAUTIONS: Anterior hip - Do not step backwards with surgical leg. No hip extension. - Do not allow surgical leg to externally rotate (turn outwards). - Do not cross your legs. Use a pillow between legs when rolling. - Sleep on your surgical side when side lying. - No bending past 90d  RED FLAGS: None   WEIGHT BEARING RESTRICTIONS: No  FALLS:  Has patient fallen in last 6 months? No  LIVING ENVIRONMENT: Lives with: lives alone Lives in: House/apartment Stairs: No Has following equipment at home: Single point cane and Wheelchair (manual)  OCCUPATION: Retired from doing hair for 43 years   PLOF: Independent and Independent with basic ADLs  PATIENT GOALS: be able to walk straight again, and get my posture upright   NEXT MD VISIT: 07/09/23  OBJECTIVE:  Note: Objective measures were completed at Evaluation unless otherwise noted.  DIAGNOSTIC FINDINGS: INDINGS: FINDINGS: Right hip arthroplasty in expected alignment. No periprosthetic lucency or fracture. Recent postsurgical change includes air and edema in the soft tissues.   IMPRESSION: Right hip arthroplasty without immediate postoperative complication.  Two fluoroscopic spot views of the pelvis and right hip obtained in the operating room. Images during hip arthroplasty. Fluoroscopy time 7 seconds. Dose 1 mGy.   IMPRESSION: Intraoperative fluoroscopy during right hip arthroplasty.   COGNITION: Overall cognitive status: Within functional limits for tasks assessed     SENSATION: WFL  POSTURE: rounded shoulders, forward head, and flexed trunk   PALPATION: Some TTP and soreness in R hip   LOWER EXTREMITY ROM:  Active ROM Right eval Left eval  Hip flexion Pain >90d    Hip extension    Hip abduction 30   Hip adduction    Hip internal rotation 25   Hip external rotation 30   Knee flexion WFL   Knee extension [facility]   Ankle dorsiflexion    Ankle  plantarflexion    Ankle inversion    Ankle eversion     (Blank rows = not tested)  LOWER EXTREMITY MMT:  MMT Right eval Right  08/07/23  Hip flexion 2+ with pain 4- w/ pain  Hip extension    Hip abduction 4- 4+  Hip adduction 4 4+  Hip internal rotation 4-   Hip external rotation 2+ with pain   Knee flexion    Knee extension    Ankle dorsiflexion    Ankle plantarflexion    Ankle inversion    Ankle eversion     (Blank rows = not tested)  FUNCTIONAL TESTS:  5 times sit to stand: 20.55s Timed up and go (TUG): 19.50s  GAIT: Distance walked: in clinic distances Assistive device utilized: Single point cane and Wheelchair (manual) Level of assistance: Modified independence Comments: antalgic gait, decrease stance time and step length with RLE, decreased hip flexion  on RLE   TODAY'S TREATMENT:                                                                                                                              DATE:  09/17/23 Sit to stands 2x10  NuStep LE only L3 x 6 min 6in step ups x10 each HS curls 25lb 2x10 Leg Ext 10lb 2x10 MHP to lumbar spine  09/10/23 NuStep L 3 x 6 min LE only  Resisted side steps 10lb x 3 each HS curls 25lb 2x12  Leg Ext 5lb 2x10 S2S 2x5 Hip add ball squeeze Hip abd 2x10 green  MHP lumbar spine  09/05/23 Nustep L 5 HS curls 20lb 3x10 Leg Ext 5lb 2x10 S2S 2x5 Standing hip Abd and Ext AROM x10 each yellow tband ADD ball squeeze Seated yellow tband hip ABD 10x  08/27/23 Nustep level 5 x 6 minutes Hs curls 20lb 3x10 Leg Ext 5lb 2x10 S2S 2x5 Standing hip Abd and Ext AROM x10 each MHP to lumbar spine  08/07/23 Nustep level 5 x 6 minutes CHECKED GOALS  Seated green tband clamshells Hip Add ball squeeze  6in step ups x10 each Resisted gait forwards & backwards 20lb x3 each  Hs curls 20lb 2x10 Leg Ext 5lb 2x10  07/30/23 Nustep level 5 x 6 minutes 6in step ups x5 each Resisted sides steps 20lb x3 each Seated red tband  clamshells MHP to lumbar spine x10 min   07/23/23 Nustep level 5 x 6 minutes Standing marching Standing hip abduction, some right hip pain Seated red tband clamshells Ball b/n knees squeeze Feet on ball K2C, rotation, small bridge, isometric abs Passive HS stretches STM tot he right ITB and HS very tender  07/12/23 Nustep level 5 x 6 minutes 20# leg curls 2x10 Tried leg extension with 5# painful in the knees 2.5# LAQ 2.5# marches 2# standing hip abduction Seated green tband clamshells Standing no weight extensions Sit to stand with arm rest x 10 total Feet on ball K2C, rotation STM gentle to the right thigh, ITB and scar, very tender  07/08/23 NuStep L4 x74mins Hip Add ball squeeze Hip Abd green 2x10 S2S x15 some with UE HS curls 20lb 2x10 Leg Ext 5lb 2x10 Standing marches 2x10 MHP to lumbar spine x10 min    PATIENT EDUCATION:  Education details: POC and HEP  Person educated: Patient Education method: Explanation Education comprehension: verbalized understanding  HOME EXERCISE PROGRAM: Access Code: [email or web address] URL: [email or web address] Date: 06/26/2023 Prepared by: Cassie Freer  Exercises - Standing Hip Abduction with Counter Support  - 1 x daily - 7 x weekly - 2 sets - 10 reps - Standing March with Counter Support  - 1 x daily - 7 x weekly - 2 sets - 10 reps - Sit to Stand  - 1 x daily - 7 x weekly - 2 sets - 10 reps - Seated Hip Abduction with Resistance  - 1 x daily - 7 x  weekly - 2 sets - 10 reps - Seated Hip Adduction Isometrics with Ball  - 1 x daily - 7 x weekly - 2 sets - 10 reps  ASSESSMENT:  CLINICAL IMPRESSION: Pt enters doing well reporting overall improvement from therapy. Continued to progressed with resistance interventions, cueing needed for eccentric control with curls and extensions. Fatigue with sit to stands. Some instability noted with step ups requiring CGA at times.    OBJECTIVE IMPAIRMENTS: Abnormal gait, decreased  mobility, difficulty walking, decreased ROM, decreased strength, and pain.   ACTIVITY LIMITATIONS: lifting, bending, sitting, standing, squatting, stairs, transfers, and locomotion level  PARTICIPATION LIMITATIONS: cleaning, laundry, driving, shopping, community activity, and yard work  [facility] POTENTIAL: Good  CLINICAL DECISION MAKING: Stable/uncomplicated  EVALUATION COMPLEXITY: Low   GOALS: Goals reviewed with patient? Yes  SHORT TERM GOALS: Target date: 08/07/23  Patient will be independent with initial HEP. Baseline: given 06/26/23 Goal status: met 07/12/23  2.  Patient will complete TUG < 14s without AD Baseline: 19.50s Goal status:progressing 07/12/23   LONG TERM GOALS: Target date: 09/18/23  Patient will be independent with advanced/ongoing HEP to improve outcomes and carryover.  Goal status: INITIAL  2.  Patient will report at least 75% improvement in R hip pain to improve QOL. Baseline: 6/10 pain Goal status: Met 85% 08/07/23  3.  Patient will demonstrate improved functional LE strength as demonstrated by 4/5 or better in hip MMT. Baseline: see chart above Goal status: progressing 07/23/23  4.  Patient will be able to ambulate 600' with LRAD and normal gait pattern without increased pain to access community.  Baseline: using RW , antalgic pattern Goal status: progressing 07/23/23  5.  Patient will complete 5xSTS < 15s from chair height Baseline: 20.55s Goal status: Progressing 16.81 08/07/23  PLAN:  PT FREQUENCY: 2x/week  PT DURATION: 12 weeks  PLANNED INTERVENTIONS: 97110-Therapeutic exercises, 97530- Therapeutic activity, 97112- Neuromuscular re-education, 97535- Self Care, 10272- Manual therapy, [phone]- Gait training, [phone]- Vasopneumatic device, Patient/Family education, Balance training, Stair training, Joint mobilization, Scar mobilization, Cryotherapy, and Moist heat  PLAN FOR NEXT SESSION: progress strength and function, possible D/C  Grayce Sessions, PTA

## 2023-09-19 ENCOUNTER — Ambulatory Visit: Payer: Medicare Other | Admitting: Physical Therapy
